# Patient Record
Sex: Male | Born: 1935 | Race: White | Hispanic: No | Marital: Married | Smoking: Former smoker
Health system: Southern US, Community
[De-identification: ages and names within clinical notes are randomized; demographics above are authoritative.]

## PROBLEM LIST (undated history)

## (undated) DIAGNOSIS — Z950 Presence of cardiac pacemaker: Secondary | ICD-10-CM

## (undated) DIAGNOSIS — I252 Old myocardial infarction: Secondary | ICD-10-CM

## (undated) DIAGNOSIS — M1990 Unspecified osteoarthritis, unspecified site: Secondary | ICD-10-CM

## (undated) DIAGNOSIS — E079 Disorder of thyroid, unspecified: Secondary | ICD-10-CM

## (undated) DIAGNOSIS — I2510 Atherosclerotic heart disease of native coronary artery without angina pectoris: Secondary | ICD-10-CM

## (undated) DIAGNOSIS — E785 Hyperlipidemia, unspecified: Secondary | ICD-10-CM

## (undated) DIAGNOSIS — I219 Acute myocardial infarction, unspecified: Secondary | ICD-10-CM

## (undated) DIAGNOSIS — N183 Chronic kidney disease, stage 3 unspecified: Secondary | ICD-10-CM

## (undated) DIAGNOSIS — K219 Gastro-esophageal reflux disease without esophagitis: Secondary | ICD-10-CM

## (undated) DIAGNOSIS — I10 Essential (primary) hypertension: Secondary | ICD-10-CM

## (undated) DIAGNOSIS — I4891 Unspecified atrial fibrillation: Secondary | ICD-10-CM

## (undated) DIAGNOSIS — N400 Enlarged prostate without lower urinary tract symptoms: Secondary | ICD-10-CM

## (undated) DIAGNOSIS — R269 Unspecified abnormalities of gait and mobility: Secondary | ICD-10-CM

## (undated) DIAGNOSIS — G4730 Sleep apnea, unspecified: Secondary | ICD-10-CM

## (undated) DIAGNOSIS — I499 Cardiac arrhythmia, unspecified: Secondary | ICD-10-CM

## (undated) DIAGNOSIS — I5022 Chronic systolic (congestive) heart failure: Secondary | ICD-10-CM

## (undated) DIAGNOSIS — E559 Vitamin D deficiency, unspecified: Secondary | ICD-10-CM

## (undated) DIAGNOSIS — T4145XA Adverse effect of unspecified anesthetic, initial encounter: Secondary | ICD-10-CM

## (undated) DIAGNOSIS — D649 Anemia, unspecified: Secondary | ICD-10-CM

## (undated) DIAGNOSIS — R0601 Orthopnea: Secondary | ICD-10-CM

## (undated) DIAGNOSIS — Z8619 Personal history of other infectious and parasitic diseases: Secondary | ICD-10-CM

## (undated) DIAGNOSIS — R609 Edema, unspecified: Secondary | ICD-10-CM

## (undated) DIAGNOSIS — T8859XA Other complications of anesthesia, initial encounter: Secondary | ICD-10-CM

## (undated) DIAGNOSIS — E039 Hypothyroidism, unspecified: Secondary | ICD-10-CM

## (undated) DIAGNOSIS — I209 Angina pectoris, unspecified: Secondary | ICD-10-CM

## (undated) HISTORY — DX: Personal history of other infectious and parasitic diseases: Z86.19

## (undated) HISTORY — PX: ATRIAL FIBRILLATION ABLATION: SHX5732

## (undated) HISTORY — PX: EXTERNAL FIXATION WRIST FRACTURE: SHX1553

## (undated) HISTORY — PX: CYST EXCISION: SHX5701

## (undated) HISTORY — DX: Vitamin D deficiency, unspecified: E55.9

## (undated) HISTORY — PX: CORONARY ANGIOPLASTY: SHX604

## (undated) HISTORY — DX: Old myocardial infarction: I25.2

## (undated) HISTORY — PX: LEG SURGERY: SHX1003

## (undated) HISTORY — PX: CARDIAC CATHETERIZATION: SHX172

## (undated) HISTORY — PX: ABLATION: SHX5711

## (undated) HISTORY — DX: Anemia, unspecified: D64.9

---

## 2000-07-26 HISTORY — PX: LASIK: SHX215

## 2002-11-06 LAB — HM COLONOSCOPY

## 2009-07-26 DIAGNOSIS — I252 Old myocardial infarction: Secondary | ICD-10-CM

## 2009-07-26 HISTORY — DX: Old myocardial infarction: I25.2

## 2009-10-21 ENCOUNTER — Inpatient Hospital Stay: Payer: Self-pay | Admitting: Cardiology

## 2009-11-27 HISTORY — PX: CORONARY ARTERY BYPASS GRAFT: SHX141

## 2010-01-27 ENCOUNTER — Encounter: Payer: Self-pay | Admitting: Cardiology

## 2010-02-23 ENCOUNTER — Encounter: Payer: Self-pay | Admitting: Cardiology

## 2010-03-26 ENCOUNTER — Encounter: Payer: Self-pay | Admitting: Cardiology

## 2012-01-24 DIAGNOSIS — I071 Rheumatic tricuspid insufficiency: Secondary | ICD-10-CM | POA: Insufficient documentation

## 2012-08-24 DIAGNOSIS — M25519 Pain in unspecified shoulder: Secondary | ICD-10-CM | POA: Insufficient documentation

## 2013-08-22 LAB — TSH: TSH: 4.84 u[IU]/mL (ref <=5.90)

## 2013-10-20 DIAGNOSIS — I10 Essential (primary) hypertension: Secondary | ICD-10-CM | POA: Insufficient documentation

## 2013-10-20 DIAGNOSIS — I2510 Atherosclerotic heart disease of native coronary artery without angina pectoris: Secondary | ICD-10-CM | POA: Insufficient documentation

## 2013-10-20 DIAGNOSIS — I25810 Atherosclerosis of coronary artery bypass graft(s) without angina pectoris: Secondary | ICD-10-CM | POA: Insufficient documentation

## 2014-02-04 ENCOUNTER — Ambulatory Visit: Payer: Self-pay | Admitting: Cardiology

## 2014-03-06 ENCOUNTER — Ambulatory Visit: Payer: Self-pay | Admitting: Cardiology

## 2014-08-26 LAB — LIPID PANEL
Cholesterol: 56 mg/dL (ref 0–200)
HDL: 24 mg/dL — AB (ref 35–70)
LDL Cholesterol: 19 mg/dL
Triglycerides: 65 mg/dL (ref 40–160)

## 2014-08-26 LAB — PSA
PSA: 0.5
PSA: 0.5

## 2014-08-26 LAB — BASIC METABOLIC PANEL
BUN: 19 mg/dL (ref 4–21)
CREATININE: 1.3 mg/dL (ref <=1.3)
Glucose: 103 mg/dL
Potassium: 4.9 mmol/L (ref 3.4–5.3)
Sodium: 137 mmol/L (ref 137–147)

## 2014-08-26 LAB — HEPATIC FUNCTION PANEL: ALT: 24 U/L (ref 10–40)

## 2014-08-26 LAB — HEMOGLOBIN A1C: HEMOGLOBIN A1C: 6.7 % — AB (ref 4.0–6.0)

## 2014-08-28 DIAGNOSIS — R195 Other fecal abnormalities: Secondary | ICD-10-CM | POA: Insufficient documentation

## 2014-08-28 LAB — FECAL OCCULT BLOOD, GUAIAC
FECAL OCCULT BLD: POSITIVE
Fecal Occult Blood: POSITIVE

## 2014-09-18 DIAGNOSIS — I255 Ischemic cardiomyopathy: Secondary | ICD-10-CM | POA: Insufficient documentation

## 2014-09-18 DIAGNOSIS — Q263 Partial anomalous pulmonary venous connection: Secondary | ICD-10-CM | POA: Insufficient documentation

## 2014-09-18 DIAGNOSIS — I519 Heart disease, unspecified: Secondary | ICD-10-CM | POA: Insufficient documentation

## 2014-09-18 DIAGNOSIS — I482 Chronic atrial fibrillation, unspecified: Secondary | ICD-10-CM | POA: Insufficient documentation

## 2014-09-18 DIAGNOSIS — I5022 Chronic systolic (congestive) heart failure: Secondary | ICD-10-CM | POA: Insufficient documentation

## 2014-10-26 DIAGNOSIS — E119 Type 2 diabetes mellitus without complications: Secondary | ICD-10-CM | POA: Insufficient documentation

## 2014-10-26 DIAGNOSIS — J069 Acute upper respiratory infection, unspecified: Secondary | ICD-10-CM | POA: Insufficient documentation

## 2014-10-26 DIAGNOSIS — E785 Hyperlipidemia, unspecified: Secondary | ICD-10-CM | POA: Insufficient documentation

## 2014-10-26 DIAGNOSIS — I219 Acute myocardial infarction, unspecified: Secondary | ICD-10-CM | POA: Insufficient documentation

## 2014-10-26 DIAGNOSIS — I10 Essential (primary) hypertension: Secondary | ICD-10-CM | POA: Insufficient documentation

## 2014-10-26 DIAGNOSIS — E039 Hypothyroidism, unspecified: Secondary | ICD-10-CM | POA: Insufficient documentation

## 2014-10-26 DIAGNOSIS — Z0000 Encounter for general adult medical examination without abnormal findings: Secondary | ICD-10-CM | POA: Insufficient documentation

## 2014-10-26 DIAGNOSIS — Z8619 Personal history of other infectious and parasitic diseases: Secondary | ICD-10-CM | POA: Insufficient documentation

## 2014-10-26 DIAGNOSIS — N400 Enlarged prostate without lower urinary tract symptoms: Secondary | ICD-10-CM | POA: Insufficient documentation

## 2014-10-26 DIAGNOSIS — Z125 Encounter for screening for malignant neoplasm of prostate: Secondary | ICD-10-CM | POA: Insufficient documentation

## 2014-10-26 DIAGNOSIS — N529 Male erectile dysfunction, unspecified: Secondary | ICD-10-CM | POA: Insufficient documentation

## 2014-10-26 DIAGNOSIS — C4491 Basal cell carcinoma of skin, unspecified: Secondary | ICD-10-CM | POA: Insufficient documentation

## 2014-10-26 DIAGNOSIS — Z79899 Other long term (current) drug therapy: Secondary | ICD-10-CM | POA: Insufficient documentation

## 2014-10-26 DIAGNOSIS — I4891 Unspecified atrial fibrillation: Secondary | ICD-10-CM | POA: Insufficient documentation

## 2014-11-11 ENCOUNTER — Other Ambulatory Visit: Payer: Self-pay | Admitting: Family Medicine

## 2014-11-11 ENCOUNTER — Encounter: Payer: Self-pay | Admitting: Family Medicine

## 2015-01-13 ENCOUNTER — Encounter
Admission: RE | Disposition: A | Discharge status: Home / Self Care | Payer: Self-pay | Source: Ambulatory Visit | Attending: Cardiology

## 2015-01-13 ENCOUNTER — Ambulatory Visit: Payer: Medicare Other | Admitting: Anesthesiology

## 2015-01-13 ENCOUNTER — Ambulatory Visit
Admission: RE | Admit: 2015-01-13 | Discharge: 2015-01-13 | Disposition: A | Discharge status: Home / Self Care | Payer: Medicare Other | Source: Ambulatory Visit | Attending: Cardiology | Admitting: Cardiology

## 2015-01-13 ENCOUNTER — Encounter: Payer: Self-pay | Admitting: *Deleted

## 2015-01-13 DIAGNOSIS — E119 Type 2 diabetes mellitus without complications: Secondary | ICD-10-CM | POA: Insufficient documentation

## 2015-01-13 DIAGNOSIS — I25810 Atherosclerosis of coronary artery bypass graft(s) without angina pectoris: Secondary | ICD-10-CM | POA: Insufficient documentation

## 2015-01-13 DIAGNOSIS — E782 Mixed hyperlipidemia: Secondary | ICD-10-CM | POA: Insufficient documentation

## 2015-01-13 DIAGNOSIS — Z8249 Family history of ischemic heart disease and other diseases of the circulatory system: Secondary | ICD-10-CM | POA: Insufficient documentation

## 2015-01-13 DIAGNOSIS — Z951 Presence of aortocoronary bypass graft: Secondary | ICD-10-CM | POA: Diagnosis not present

## 2015-01-13 DIAGNOSIS — Z720 Tobacco use: Secondary | ICD-10-CM | POA: Insufficient documentation

## 2015-01-13 DIAGNOSIS — I480 Paroxysmal atrial fibrillation: Secondary | ICD-10-CM | POA: Insufficient documentation

## 2015-01-13 DIAGNOSIS — R0602 Shortness of breath: Secondary | ICD-10-CM | POA: Insufficient documentation

## 2015-01-13 DIAGNOSIS — G4730 Sleep apnea, unspecified: Secondary | ICD-10-CM | POA: Diagnosis not present

## 2015-01-13 DIAGNOSIS — I272 Other secondary pulmonary hypertension: Secondary | ICD-10-CM | POA: Insufficient documentation

## 2015-01-13 DIAGNOSIS — I5022 Chronic systolic (congestive) heart failure: Secondary | ICD-10-CM | POA: Insufficient documentation

## 2015-01-13 DIAGNOSIS — Z79899 Other long term (current) drug therapy: Secondary | ICD-10-CM | POA: Insufficient documentation

## 2015-01-13 DIAGNOSIS — F419 Anxiety disorder, unspecified: Secondary | ICD-10-CM | POA: Insufficient documentation

## 2015-01-13 DIAGNOSIS — I252 Old myocardial infarction: Secondary | ICD-10-CM | POA: Diagnosis not present

## 2015-01-13 DIAGNOSIS — E079 Disorder of thyroid, unspecified: Secondary | ICD-10-CM | POA: Diagnosis not present

## 2015-01-13 DIAGNOSIS — Z87891 Personal history of nicotine dependence: Secondary | ICD-10-CM | POA: Diagnosis not present

## 2015-01-13 DIAGNOSIS — I482 Chronic atrial fibrillation: Secondary | ICD-10-CM | POA: Diagnosis present

## 2015-01-13 DIAGNOSIS — I4819 Other persistent atrial fibrillation: Secondary | ICD-10-CM

## 2015-01-13 HISTORY — DX: Acute myocardial infarction, unspecified: I21.9

## 2015-01-13 HISTORY — DX: Disorder of thyroid, unspecified: E07.9

## 2015-01-13 HISTORY — PX: ELECTROPHYSIOLOGIC STUDY: SHX172A

## 2015-01-13 HISTORY — DX: Essential (primary) hypertension: I10

## 2015-01-13 HISTORY — DX: Sleep apnea, unspecified: G47.30

## 2015-01-13 HISTORY — DX: Atherosclerotic heart disease of native coronary artery without angina pectoris: I25.10

## 2015-01-13 SURGERY — CARDIOVERSION (CATH LAB)
Anesthesia: General

## 2015-01-13 MED ORDER — EPHEDRINE SULFATE 50 MG/ML IJ SOLN
INTRAMUSCULAR | Status: DC | PRN
  Administered 2015-01-13: 10 mg via INTRAVENOUS
  Administered 2015-01-13: 5 mg via INTRAVENOUS

## 2015-01-13 MED ORDER — PROPOFOL 10 MG/ML IV BOLUS
INTRAVENOUS | Status: DC | PRN
  Administered 2015-01-13: 70 mg via INTRAVENOUS

## 2015-01-13 MED ORDER — LACTATED RINGERS IV SOLN
INTRAVENOUS | Status: DC | PRN
  Administered 2015-01-13: 07:00:00 via INTRAVENOUS

## 2015-01-13 MED ORDER — SODIUM CHLORIDE 0.9 % IV SOLN
INTRAVENOUS | Status: DC
  Administered 2015-01-13: 07:00:00 via INTRAVENOUS

## 2015-01-13 NOTE — Transfer of Care (Signed)
Immediate Anesthesia Transfer of Care Note  Patient: Jeffrey Gallegos  Procedure(s) Performed: Procedure(s): CARDIOVERSION (N/A)  Patient Location:   Anesthesia Type:General  Level of Consciousness: alert , oriented, patient cooperative and responds to stimulation  Airway & Oxygen Therapy: Patient Spontanous Breathing and Patient connected to face mask oxygen  Post-op Assessment: Report given to RN and Post -op Vital signs reviewed and stable  Post vital signs: Reviewed and stable  Last Vitals:  Filed Vitals:   01/13/15 0800  BP: 92/57  Pulse:   Temp:   Resp: 15    Complications: No apparent anesthesia complications

## 2015-01-13 NOTE — OR Nursing (Signed)
Transfer of care from anesthesia at [id number]. SEE anesthesia record for monitoring of pt during case

## 2015-01-13 NOTE — Anesthesia Preprocedure Evaluation (Signed)
Anesthesia Evaluation  Patient identified by MRN, date of birth, ID band Patient awake    Reviewed: Allergy & Precautions, NPO status , Patient's Chart, lab work & pertinent test results  History of Anesthesia Complications Negative for: history of anesthetic complications  Airway Mallampati: II       Dental  (+) Upper Dentures   Pulmonary sleep apnea , former smoker,    + decreased breath sounds      Cardiovascular hypertension, Pt. on medications + CAD and + Past MI + dysrhythmias Atrial Fibrillation Rhythm:Irregular     Neuro/Psych Anxiety negative neurological ROS     GI/Hepatic negative GI ROS, Neg liver ROS,   Endo/Other  diabetes, Oral Hypoglycemic Agents  Renal/GU negative Renal ROS  negative genitourinary   Musculoskeletal negative musculoskeletal ROS (+)   Abdominal Normal abdominal exam  (+)   Peds negative pediatric ROS (+)  Hematology negative hematology ROS (+)   Anesthesia Other Findings   Reproductive/Obstetrics negative OB ROS                             Anesthesia Physical Anesthesia Plan  ASA: III  Anesthesia Plan: General   Post-op Pain Management:    Induction: Intravenous  Airway Management Planned: Nasal Cannula  Additional Equipment:   Intra-op Plan:   Post-operative Plan:   Informed Consent: I have reviewed the patients History and Physical, chart, labs and discussed the procedure including the risks, benefits and alternatives for the proposed anesthesia with the patient or authorized representative who has indicated his/her understanding and acceptance.     Plan Discussed with: CRNA  Anesthesia Plan Comments:         Anesthesia Quick Evaluation

## 2015-01-13 NOTE — Discharge Instructions (Signed)
cardiovers

## 2015-01-13 NOTE — Procedures (Signed)
Electrical Cardioversion Procedure Note [facility] [id number] 1934/11/09  Procedure: Electrical Cardioversion Indications:  Atrial Fibrillation  Procedure Details Consent: Risks of procedure as well as the alternatives and risks of each were explained to the (patient/caregiver).  Consent for procedure obtained. Time Out: Verified patient identification, verified procedure, site/side was marked, verified correct patient position, special equipment/implants available, medications/allergies/relevent history reviewed, required imaging and test results available.  Performed  Patient placed on cardiac monitor, pulse oximetry, supplemental oxygen as necessary.  Sedation given: Propofol Pacer pads placed anterior and posterior chest.  Cardioverted 1 time(s).  Cardioverted at 120J.  Evaluation Findings: Post procedure EKG shows: NSR Complications: Pt was bradycardic and hypotensive post procedure which required ephedra 15 mg iv with resolution. Slow recovery from sedation. No evidence of neurologic complications post procedure Patient did tolerate procedure well.   Steffani Dionisio A. 01/13/2015, 8:17 AM

## 2015-01-13 NOTE — H&P (Signed)
Chief Complaint: Chief Complaint  Patient presents with  . Follow-up  per pt  . Atrial Fibrillation  had ablation on 12/13/2014 I think I went back into afib has an appt with Dr Marcello Moores next week  . Shortness of Breath  I do have some  . OTHER  I think Im getting to close to the edge -- heart attack  Date of Service: 01/10/2015 Date of Birth: [birth year] PCP: Birdie Sons, MD  History of Present Illness: Jeffrey Gallegos is a 80 y.o.male patient who returns for follow-up visit. Has a history of coronary artery disease status post coronary artery bypass grafting in 2011, history of recently diagnosed partially anomalous venous return. This has resulted in pulmonary hypertension. Had his clinical course decompensated with development of atrial fibrillation. He has had extensive workup at [facility] including ablation of his atrial fibrillation. He now returns complaining of increasing shortness of breath feeling like he is back in atrial fibrillation electrocardiogram today reveals atrial fibrillation at a rate of 86 with a right bundle-branch block left anterior fascicular block. His QRS duration 174 milliseconds QTC of 593 milliseconds. He is symptomatic from this complaining of shortness of breath. He desires consideration cardioversion. He has been anticoagulated with dabigatran continuously at 150 mg twice daily. Patient has an ejection fraction of 35% with an akinetic apex. He has a dilated right ventricle.  Past Medical and Surgical History  Past Medical History Past Medical History  Diagnosis Date  . Coronary artery disease  . Atrial fibrillation  . Hyperlipidemia  . Thyroid disease  . Hypertension  . Sleep apnea   Past Surgical History He has past surgical history that includes Cardiac catheterization; Coronary artery bypass graft (2011); Fracture surgery; and Cystectomy.   Medications and Allergies  Current Medications  Current Outpatient Prescriptions   Medication Sig Dispense Refill  . AMIOdarone (PACERONE) 200 MG tablet Take 1 tablet (200 mg total) by mouth every 12 (twelve) hours. 60 tablet 11  . atorvastatin (LIPITOR) 20 MG tablet Take 20 mg by mouth nightly.  . carvedilol (COREG) 3.125 MG tablet Take 3.125 mg by mouth 2 (two) times daily with meals.  . colchicine (COLCRYS) 0.6 mg tablet Take 1 tablet (0.6 mg total) by mouth once daily. Take 2 tablets (1.2mg ) by mouth at first sign of gout flare followed by 1 tablet (0.6mg ) after 1 hour. (Max 1.8mg  within 1 hour) 30 tablet 0  . dabigatran (PRADAXA) 150 mg capsule Take 1 capsule (150 mg total) by mouth 2 (two) times daily. 60 capsule 5  . docusate (COLACE) 100 MG capsule Take 100 mg by mouth nightly.  . levothyroxine (SYNTHROID, LEVOTHROID) 88 MCG tablet Take 100 mcg by mouth once daily. Take on an empty stomach with a glass of water at least 30-60 minutes before breakfast.  . naproxen sodium (ALEVE, ANAPROX) 220 MG tablet Take 220 mg by mouth as needed for Pain.  . niacin, inositol niacinate, 400 mg niacin (500 mg) Cap Take 3 capsules by mouth every morning.  . pantoprazole (PROTONIX) 20 MG DR tablet Take 20 mg by mouth every evening.  . potassium chloride 20 mEq TbER Take by mouth.  . ramipril (ALTACE) 2.5 MG capsule Take 1 capsule (2.5 mg total) by mouth once daily. 30 capsule 11  . saw palmetto 500 MG capsule Take 500 mg by mouth. Take 4 capsules po twice a day  . TORsemide (DEMADEX) 10 MG tablet Take 2 tablets (20 mg total) by mouth once daily. [place]  tablet 11  . nitroGLYcerin (NITROSTAT) 0.4 MG SL tablet Place 1 tablet (0.4 mg total) under the tongue every 5 (five) minutes as needed for Chest pain. May take up to 3 doses. 25 tablet 11  . tadalafil (CIALIS) 5 MG tablet Take 5 mg by mouth once daily as needed.   No current facility-administered medications for this visit.   Allergies: Review of patient's allergies indicates no known allergies.  Social and Family History  Social  History reports that he quit smoking about 48 years ago. His smoking use included Cigarettes. He has a 15 pack-year smoking history. His smokeless tobacco use includes Chew. He reports that he drinks about 0.6 oz of alcohol per week. He reports that he does not use illicit drugs.  Family History Family History  Problem Relation Age of Onset  . Sudden death Father  . Heart attack Father  . No Known Problems Mother  . No Known Problems Sister  . Anesthesia problems Neg Hx  . Malignant hyperthermia Neg Hx   Review of Systems  Review of Systems  Constitutional: Positive for malaise/fatigue. Negative for fever, chills, weight loss and diaphoresis.  HENT: Negative for congestion, ear discharge, hearing loss and tinnitus.  Eyes: Negative for blurred vision.  Respiratory: Positive for shortness of breath. Negative for cough, hemoptysis, sputum production and wheezing.  Cardiovascular: Negative for chest pain, palpitations, orthopnea, claudication, leg swelling and PND.  Gastrointestinal: Negative for heartburn, nausea, vomiting, abdominal pain, diarrhea, constipation, blood in stool and melena.  Genitourinary: Negative for dysuria, urgency, frequency and hematuria.  Musculoskeletal: Negative for myalgias, back pain, joint pain and falls.  Skin: Negative for itching and rash.  Neurological: Positive for weakness. Negative for dizziness, tingling, focal weakness, loss of consciousness and headaches.  Endo/Heme/Allergies: Negative for polydipsia. Does not bruise/bleed easily.  Psychiatric/Behavioral: Negative for depression, memory loss and substance abuse. The patient is not nervous/anxious.    Physical Examination   Vitals:BP 110/62 mmHg  Pulse 80  Resp 10  Ht 174 cm (5' 8.5")  Wt 66.225 kg (146 lb)  BMI 21.87 kg/m2 Ht:174 cm (5' 8.5") Wt:66.225 kg (146 lb) IHK:VQQV surface area is 1.79 meters squared. Body mass index is 21.87 kg/(m^2).  Wt Readings from Last 3 Encounters:  01/10/15  66.225 kg (146 lb)  12/13/14 61.5 kg (135 lb 9.3 oz)  12/12/14 63.3 kg (139 lb 8.8 oz)   BP Readings from Last 3 Encounters:  01/10/15 110/62  12/14/14 103/51  12/12/14 101/53   General appearance appears in no acute distress  Head Mouth and Eye exam Normocephalic, without obvious abnormality, atraumatic Dentition is good Eyes appear anicteric   Neck exam Thyroid: normal  Nodes: no obvious adenopathy  LUNGS Breath Sounds: Normal Percussion: Normal  CARDIOVASCULAR JVP CV wave: no HJR: no Elevation at 90 degrees: None Carotid Pulse: normal pulsation bilaterally Bruit: None Apex: apical impulse normal  Auscultation Rhythm: atrial fibrillation S1: normal S2: normal Clicks: no Rub: no Murmurs: no murmurs  Gallop: None ABDOMEN Liver enlargement: no Pulsatile aorta: no Ascites: no Bruits: no  EXTREMITIES Clubbing: no Edema: trace to 1+ bilateral pedal edema Pulses: peripheral pulses symmetrical Femoral Bruits: no Amputation: no SKIN Rash: no Cyanosis: no Embolic phemonenon: no Bruising: no NEURO Alert and Oriented to person, place and time: yes Non focal: yes  PSYCH: Pt appears to have normal affect  LABS REVIEWED Last 3 CBC results: Lab Results  Component Value Date  WBC 5.9 12/12/2014  WBC 5.9 09/18/2014  WBC 6.0 09/10/2014  Lab Results  Component Value Date  HGB 11.5* 12/12/2014  HGB 10.5* 09/18/2014  HGB 10.0* 09/10/2014   Lab Results  Component Value Date  HCT 36.3* 12/12/2014  HCT 33.1* 09/18/2014  HCT 33.8* 09/10/2014   Lab Results  Component Value Date  PLT 240 12/12/2014  PLT 199 09/18/2014  PLT 215 09/10/2014   Lab Results  Component Value Date  CREATININE 1.1 12/14/2014  BUN 14 12/14/2014  NA 136 12/14/2014  K 4.2 12/14/2014  CL 99 12/14/2014  CO2 29 12/14/2014   Lab Results  Component Value Date  HGBA1C 6.0 11/26/2009   Lab Results  Component Value Date  HDL 31 10/22/2009   Lab Results  Component  Value Date  LDLCALC 114 10/22/2009   Lab Results  Component Value Date  TRIG 88 10/22/2009   Lab Results  Component Value Date  ALT 56 11/29/2009  AST 75* 11/29/2009  ALKPHOS 41 11/29/2009   Lab Results  Component Value Date  TSH 10.32* 09/18/2014   Diagnostic Studies Reviewed:  EKG EKG demonstrated atrial fibrillation, rate 86, RBBB.  Assessment and Plan   79 y.o. male with  ICD-10-CM ICD-9-CM  1. Paroxysmal a-fib recurrent atrial fibrillation post ablation. Will proceed with attempt at cardioversion. The risks and benefits were explained to the patient he agrees to proceed. Will continue with dabigatran and he has been on this greater than 4 weeks prior to this attempt. Further recommendations after this attempt. This will be carried out early next week. I48.0 427.31 ECG 12-lead  2. Coronary atherosclerosis of autologous vein bypass graft without angina I25.810 414.02  3. Chronic systolic CHF (congestive heart failure), NYHA class 3 I50.22 428.22  428.0  4. Essential hypertension, benign I10 401.1  5. Atrial fibrillation, chronic I48.2 427.31  6. Partial anomalous pulmonary venous return Q26.3 747.42  7. Right ventricular enlargement I51.7 429.3  8. Severe tricuspid regurgitation I07.1 397.0  9. Hyperlipidemia, mixed E78.2 272.2   No Follow-up on file.  These notes generated with voice recognition software. I apologize for typographical errors.  Sydnee Levans, MD

## 2015-01-13 NOTE — Anesthesia Postprocedure Evaluation (Signed)
  Anesthesia Post-op Note  Patient: Jeffrey Gallegos  Procedure(s) Performed: Procedure(s): CARDIOVERSION (N/A)  Anesthesia type:General  Patient location: PACU  Post pain: Pain level controlled  Post assessment: Post-op Vital signs reviewed, Patient's Cardiovascular Status Stable, Respiratory Function Stable, Patent Airway and No signs of Nausea or vomiting  Post vital signs: Reviewed and stable  Last Vitals:  Filed Vitals:   01/13/15 0800  BP: 92/57  Pulse:   Temp:   Resp: 15    Level of consciousness: awake, alert  and patient cooperative  Complications: No apparent anesthesia complications

## 2015-01-13 NOTE — OR Nursing (Signed)
PT arrived via wheelchair for cardioversion. Doc. Atrial fib confirmed by EKG, last dose of pradexa 6-19. Anesthesia present for case and transfer of care to them began on completion of pre proc assessment.

## 2015-01-16 DIAGNOSIS — R001 Bradycardia, unspecified: Secondary | ICD-10-CM | POA: Insufficient documentation

## 2015-01-19 HISTORY — PX: INSERT / REPLACE / REMOVE PACEMAKER: SUR710

## 2015-02-17 ENCOUNTER — Other Ambulatory Visit: Payer: Self-pay | Admitting: Family Medicine

## 2015-02-21 DIAGNOSIS — N189 Chronic kidney disease, unspecified: Secondary | ICD-10-CM | POA: Insufficient documentation

## 2015-03-18 ENCOUNTER — Encounter: Payer: Medicare Other | Attending: Pediatric Cardiology | Admitting: *Deleted

## 2015-03-18 VITALS — Ht 68.5 in | Wt 142.5 lb

## 2015-03-18 DIAGNOSIS — I5022 Chronic systolic (congestive) heart failure: Secondary | ICD-10-CM | POA: Diagnosis present

## 2015-03-18 NOTE — Progress Notes (Signed)
Cardiac Individual Treatment Plan  Patient Details  Name: Jeffrey Gallegos MRN: [id number] Date of Birth: 1934-09-27 Referring Provider:  Newman Nickels*  Initial Encounter Date: Date: 03/18/15  Visit Diagnosis: Chronic systolic congestive heart failure  Patient's Home Medications on Admission:  Current outpatient prescriptions:  .  amiodarone (PACERONE) 200 MG tablet, Take 200 mg by mouth 2 (two) times daily., Disp: , Rfl:  .  atorvastatin (LIPITOR) 80 MG tablet, Take 20 mg by mouth every morning. Every evening, Disp: , Rfl:  .  carvedilol (COREG) 3.125 MG tablet, Take 1 tablet by mouth 2 (two) times daily., Disp: , Rfl:  .  docusate sodium (COLACE) 100 MG capsule, Take 100 mg by mouth daily as needed for mild constipation., Disp: , Rfl:  .  levothyroxine (SYNTHROID, LEVOTHROID) 100 MCG tablet, Take 1 tablet by mouth daily., Disp: , Rfl:  .  niacin 500 MG tablet, Take 3 tablets by mouth 1 day or 1 dose., Disp: , Rfl:  .  nitroGLYCERIN (NITROSTAT) 0.4 MG SL tablet, Place 0.4 mg under the tongue every 5 (five) minutes as needed for chest pain., Disp: , Rfl:  .  pantoprazole (PROTONIX) 40 MG tablet, TAKE ONE (1) TABLET BY MOUTH EVERY DAY, Disp: 30 tablet, Rfl: 12 .  Saw Palmetto, Serenoa repens, 450 MG CAPS, Take 3 tablets by mouth 2 (two) times daily., Disp: , Rfl:  .  torsemide (DEMADEX) 10 MG tablet, Take 20 mg by mouth daily., Disp: , Rfl:  .  DABIGATRAN ETEXILATE MESYLATE PO, Take by mouth., Disp: , Rfl:  .  digoxin (LANOXIN) 0.125 MG tablet, Take 1 tablet by mouth daily., Disp: , Rfl:  .  furosemide (LASIX) 20 MG tablet, Take 1 tablet by mouth 3 (three) times a week., Disp: , Rfl:  .  hydrocortisone (ANUSOL-HC) 2.5 % rectal cream, Place 1 application rectally. 2-3 times daily as needed, Disp: , Rfl:  .  naproxen sodium (ANAPROX) 220 MG tablet, Take 220 mg by mouth 2 (two) times daily with a meal. PRN for pain, Disp: , Rfl:  .  Potassium Chloride ER 20 MEQ TBCR, Take 2 tablets  by mouth daily., Disp: , Rfl:  .  ramipril (ALTACE) 2.5 MG capsule, Take 2.5 mg by mouth daily., Disp: , Rfl:  .  tadalafil (CIALIS) 5 MG tablet, Take 1 tablet by mouth daily., Disp: , Rfl:   Past Medical History: Past Medical History  Diagnosis Date  . History of MI (myocardial infarction) 2011  . History of mumps     Childhood  . Coronary artery disease   . Myocardial infarction   . Hypertension   . Sleep apnea   . Thyroid disease     synthroid    Tobacco Use: History  Smoking status  . Former Smoker  . Types: Cigarettes  . Quit date: 07/26/1968  Smokeless tobacco  . Current User  . Types: Chew    Comment: Quit smoking in the 1970s    Labs: Recent Review Scientist, physiological    Labs for ITP Cardiac and Pulmonary Rehab Latest Ref Rng 08/26/2014   Cholestrol 0 - 200 mg/dL 56   LDLCALC - 19   HDL 35 - 70 mg/dL 24(A)   Trlycerides 40 - 160 mg/dL 65   Hemoglobin A1c 4.0 - 6.0 % 6.7(A)       Exercise Target Goals: Date: 03/18/15  Exercise Program Goal: Individual exercise prescription set with THRR, safety & activity barriers. Participant demonstrates ability to understand and report RPE  using BORG scale, to self-measure pulse accurately, and to acknowledge the importance of the exercise prescription.  Exercise Prescription Goal: Starting with aerobic activity 30 plus minutes a day, 3 days per week for initial exercise prescription. Provide home exercise prescription and guidelines that participant acknowledges understanding prior to discharge.  Activity Barriers & Risk Stratification:     Activity Barriers & Risk Stratification - 03/18/15 0816    Activity Barriers & Risk Stratification   Activity Barriers None;Deconditioning   Risk Stratification High      6 Minute Walk:     6 Minute Walk      03/18/15 1024       6 Minute Walk   Phase Initial     Distance 800 feet     Walk Time 5.38 minutes     Resting HR 61 bpm     Resting BP 108/52 mmHg     Max Ex. HR  114 bpm     Max Ex. BP 100/50 mmHg     RPE 15     Symptoms No        Initial Exercise Prescription:     Initial Exercise Prescription - 03/18/15 1000    Date of Initial Exercise Prescription   Date 03/18/15   Treadmill   MPH 1.5   Grade 0   Minutes 10   Bike   Level 0.2   Watts 10   Minutes 10   Recumbant Bike   Level 2   RPM 40   Watts 15   Minutes 10   NuStep   Level 2   Watts 30   Minutes 15   Arm Ergometer   Level 1   Watts 8   Minutes 10   Arm/Foot Ergometer   Level 4   Watts 12   Minutes 10   Cybex   Level 1   RPM 50   Minutes 10   Recumbant Elliptical   Level 1   RPM 40   Watts 10   Minutes 10   Elliptical   Level 1   Speed 3   Minutes 1   REL-XR   Level 2   Watts 35   Minutes 15   Prescription Details   Frequency (times per week) 3   Duration Progress to 30 minutes of continuous aerobic without signs/symptoms of physical distress   Intensity   THRR REST +  30   Ratings of Perceived Exertion 11-15   Progression Continue progressive overload as per policy without signs/symptoms or physical distress.   Resistance Training   Training Prescription Yes   Weight 2   Reps 10-15      Exercise Prescription Changes:   Discharge Exercise Prescription (Final Exercise Prescription Changes):   Nutrition:  Target Goals: Understanding of nutrition guidelines, daily intake of sodium 1500mg , cholesterol 200mg , calories 30% from fat and 7% or less from saturated fats, daily to have 5 or more servings of fruits and vegetables.  Biometrics:     Pre Biometrics - 03/18/15 1021    Pre Biometrics   Height 5' 8.5" (1.74 m)   Weight 142 lb 8 oz (64.638 kg)   Waist Circumference 36.5 inches   Hip Circumference 36.25 inches   Waist to Hip Ratio 1.01 %   BMI (Calculated) 21.4       Nutrition Therapy Plan and Nutrition Goals:   Nutrition Discharge: Rate Your Plate Scores:   Nutrition Goals Re-Evaluation:   Psychosocial: Target Goals:  Acknowledge presence or absence of  depression, maximize coping skills, provide positive support system. Participant is able to verbalize types and ability to use techniques and skills needed for reducing stress and depression.  Initial Review & Psychosocial Screening:     Initial Psych Review & Screening - 03/18/15 0826    Family Dynamics   Good Support System? Yes   Barriers   Psychosocial barriers to participate in program There are no identifiable barriers or psychosocial needs.;The patient should benefit from training in stress management and relaxation.   Screening Interventions   Interventions Encouraged to exercise;Program counselor consult      Quality of Life Scores:     Quality of Life - 03/18/15 1628    Quality of Life Scores   Health/Function Pre 22.8 %   Socioeconomic Pre 30 %   Psych/Spiritual Pre 29.14 %   Family Pre 30 %   GLOBAL Pre 26.44 %      PHQ-9:     Recent Review Flowsheet Data    Depression screen [facility] 2/9 03/18/2015   Decreased Interest 0   Down, Depressed, Hopeless 0   PHQ - 2 Score 0   Altered sleeping 0   Tired, decreased energy 1   Change in appetite 0   Feeling bad or failure about yourself  0   Trouble concentrating 0   Moving slowly or fidgety/restless 0   Suicidal thoughts 0   PHQ-9 Score 1   Difficult doing work/chores Somewhat difficult      Psychosocial Evaluation and Intervention:   Psychosocial Re-Evaluation:   Vocational Rehabilitation: Provide vocational rehab assistance to qualifying candidates.   Vocational Rehab Evaluation & Intervention:     Vocational Rehab - 03/18/15 0817    Initial Vocational Rehab Evaluation & Intervention   Assessment shows need for Vocational Rehabilitation No      Education: Education Goals: Education classes will be provided on a weekly basis, covering required topics. Participant will state understanding/return demonstration of topics presented.  Learning Barriers/Preferences:      Learning Barriers/Preferences - 03/18/15 0817    Learning Barriers/Preferences   Learning Barriers None   Learning Preferences None      Education Topics: General Nutrition Guidelines/Fats and Fiber: -Group instruction provided by verbal, written material, models and posters to present the general guidelines for heart healthy nutrition. Gives an explanation and review of dietary fats and fiber.   Controlling Sodium/Reading Food Labels: -Group verbal and written material supporting the discussion of sodium use in heart healthy nutrition. Review and explanation with models, verbal and written materials for utilization of the food label.   Exercise Physiology & Risk Factors: - Group verbal and written instruction with models to review the exercise physiology of the cardiovascular system and associated critical values. Details cardiovascular disease risk factors and the goals associated with each risk factor.   Aerobic Exercise & Resistance Training: - Gives group verbal and written discussion on the health impact of inactivity. On the components of aerobic and resistive training programs and the benefits of this training and how to safely progress through these programs.   Flexibility, Balance, General Exercise Guidelines: - Provides group verbal and written instruction on the benefits of flexibility and balance training programs. Provides general exercise guidelines with specific guidelines to those with heart or lung disease. Demonstration and skill practice provided.   Stress Management: - Provides group verbal and written instruction about the health risks of elevated stress, cause of high stress, and healthy ways to reduce stress.   Depression: - Provides group  verbal and written instruction on the correlation between heart/lung disease and depressed mood, treatment options, and the stigmas associated with seeking treatment.   Anatomy & Physiology of the Heart: - Group  verbal and written instruction and models provide basic cardiac anatomy and physiology, with the coronary electrical and arterial systems. Review of: AMI, Angina, Valve disease, Heart Failure, Cardiac Arrhythmia, Pacemakers, and the ICD.   Cardiac Procedures: - Group verbal and written instruction and models to describe the testing methods done to diagnose heart disease. Reviews the outcomes of the test results. Describes the treatment choices: Medical Management, Angioplasty, or Coronary Bypass Surgery.   Cardiac Medications: - Group verbal and written instruction to review commonly prescribed medications for heart disease. Reviews the medication, class of the drug, and side effects. Includes the steps to properly store meds and maintain the prescription regimen.   Go Sex-Intimacy & Heart Disease, Get SMART - Goal Setting: - Group verbal and written instruction through game format to discuss heart disease and the return to sexual intimacy. Provides group verbal and written material to discuss and apply goal setting through the application of the S.M.A.R.T. Method.   Other Matters of the Heart: - Provides group verbal, written materials and models to describe Heart Failure, Angina, Valve Disease, and Diabetes in the realm of heart disease. Includes description of the disease process and treatment options available to the cardiac patient.   Exercise & Equipment Safety: - Individual verbal instruction and demonstration of equipment use and safety with use of the equipment.          Cardiac Rehab from 03/18/2015 in [facility]iac Re[facility]   Date  03/18/15   Educator  SB   Instruction Review Code  2- meets goals/outcomes      Infection Prevention: - Provides verbal and written material to individual with discussion of infection control including proper hand washing and proper equipment cleaning during exercise session.      Cardiac Rehab from 03/18/2015 in [facility] Cardiac Rehab   Date  03/18/15    Educator  SB   Instruction Review Code  2- meets goals/outcomes      Falls Prevention: - Provides verbal and written material to individual with discussion of falls prevention and safety.      Cardiac Rehab from 03/18/2015 in [facility] Cardiac Rehab   Date  03/18/15   Educator  Sb   Instruction Review Code  2- meets goals/outcomes      Diabetes: - Individual verbal and written instruction to review signs/symptoms of diabetes, desired ranges of glucose level fasting, after meals and with exercise. Advice that pre and post exercise glucose checks will be done for 3 sessions at entry of program.    Knowledge Questionnaire Score:     Knowledge Questionnaire Score - 03/18/15 1625    Knowledge Questionnaire Score   Pre Score 23/28      Personal Goals and Risk Factors at Admission:     Personal Goals and Risk Factors at Admission - 03/18/15 0824    Personal Goals and Risk Factors on Admission    Weight Management Yes   Intervention Learn and follow the exercise and diet guidelines while in the program. Utilize the nutrition and education classes to help gain knowledge of the diet and exercise expectations in the program   Increase Aerobic Exercise and Physical Activity Yes;Sedentary   Intervention While in program, learn and follow the exercise prescription taught. Start at a low level workload and increase workload after able to maintain  previous level for 30 minutes. Increase time before increasing intensity.   Intervention Provide exercise education and an individualized exercise prescription that will provide continued progressive overload as per policy without signs/symptoms of physical distress.   Diabetes No   Hypertension No   Lipids Yes   Goal Cholesterol controlled with medications as prescribed, with individualized exercise RX and with personalized nutrition plan. Value goals: LDL < 70mg , HDL > 40mg . Participant states understanding of desired cholesterol values and following  prescriptions.   Intervention Provide nutrition & aerobic exercise along with prescribed medications to achieve LDL 70mg , HDL >40mg .   Stress Yes   Goal To meet with psychosocial counselor for stress and relaxation information and guidance. To state understanding of performing relaxation techniques and or identifying personal stressors.   Intervention Provide education on types of stress, identifiying stressors, and ways to cope with stress. Provide demonstration and active practice of relaxation techniques.      Personal Goals and Risk Factors Review:    Personal Goals Discharge:     Comments:Initial Treatment Plan. Chevon has completed orientation and will start classes soon.

## 2015-03-18 NOTE — Patient Instructions (Signed)
Patient Instructions  Patient Details  Name: Jeffrey Gallegos MRN: [id number] Date of Birth: 12-20-1934 Referring Provider:  Newman Nickels*  Below are the personal goals you chose as well as exercise and nutrition goals. Our goal is to help you keep on track towards obtaining and maintaining your goals. We will be discussing your progress on these goals with you throughout the program.  Initial Exercise Prescription:     Initial Exercise Prescription - 03/18/15 1000    Date of Initial Exercise Prescription   Date 03/18/15   Treadmill   MPH 1.5   Grade 0   Minutes 10   Bike   Level 0.2   Watts 10   Minutes 10   Recumbant Bike   Level 2   RPM 40   Watts 15   Minutes 10   NuStep   Level 2   Watts 30   Minutes 15   Arm Ergometer   Level 1   Watts 8   Minutes 10   Arm/Foot Ergometer   Level 4   Watts 12   Minutes 10   Cybex   Level 1   RPM 50   Minutes 10   Recumbant Elliptical   Level 1   RPM 40   Watts 10   Minutes 10   Elliptical   Level 1   Speed 3   Minutes 1   REL-XR   Level 2   Watts 35   Minutes 15   Prescription Details   Frequency (times per week) 3   Duration Progress to 30 minutes of continuous aerobic without signs/symptoms of physical distress   Intensity   THRR REST +  30   Ratings of Perceived Exertion 11-15   Progression Continue progressive overload as per policy without signs/symptoms or physical distress.   Resistance Training   Training Prescription Yes   Weight 2   Reps 10-15      Exercise Goals: Frequency: Be able to perform aerobic exercise three times per week working toward 3-5 days per week.  Intensity: Work with a perceived exertion of 11 (fairly light) - 15 (hard) as tolerated. Follow your new exercise prescription and watch for changes in prescription as you progress with the program. Changes will be reviewed with you when they are made.  Duration: You should be able to do 30 minutes of continuous aerobic  exercise in addition to a 5 minute warm-up and a 5 minute cool-down routine.  Nutrition Goals: Your personal nutrition goals will be established when you do your nutrition analysis with the dietician.  The following are nutrition guidelines to follow: Cholesterol < 200mg /day Sodium < 1500mg /day Fiber: Men over 50 yrs - 30 grams per day    Personal Goals:     Personal Goals and Risk Factors at Admission - 03/18/15 0824    Personal Goals and Risk Factors on Admission    Weight Management Yes   Intervention Learn and follow the exercise and diet guidelines while in the program. Utilize the nutrition and education classes to help gain knowledge of the diet and exercise expectations in the program   Increase Aerobic Exercise and Physical Activity Yes;Sedentary   Intervention While in program, learn and follow the exercise prescription taught. Start at a low level workload and increase workload after able to maintain previous level for 30 minutes. Increase time before increasing intensity.   Intervention Provide exercise education and an individualized exercise prescription that will provide continued progressive overload as per policy  without signs/symptoms of physical distress.   Diabetes No   Hypertension No   Lipids Yes   Goal Cholesterol controlled with medications as prescribed, with individualized exercise RX and with personalized nutrition plan. Value goals: LDL < 70mg , HDL > 40mg . Participant states understanding of desired cholesterol values and following prescriptions.   Intervention Provide nutrition & aerobic exercise along with prescribed medications to achieve LDL 70mg , HDL >40mg .   Stress Yes   Goal To meet with psychosocial counselor for stress and relaxation information and guidance. To state understanding of performing relaxation techniques and or identifying personal stressors.   Intervention Provide education on types of stress, identifiying stressors, and ways to cope  with stress. Provide demonstration and active practice of relaxation techniques.      Tobacco Use Initial Evaluation: History  Smoking status  . Former Smoker  . Types: Cigarettes  . Quit date: 07/26/1968  Smokeless tobacco  . Current User  . Types: Chew    Comment: Quit smoking in the 1970s    Copy of goals given to participant.

## 2015-03-25 ENCOUNTER — Encounter: Payer: Medicare Other | Admitting: *Deleted

## 2015-03-25 DIAGNOSIS — I5022 Chronic systolic (congestive) heart failure: Secondary | ICD-10-CM | POA: Diagnosis not present

## 2015-03-25 NOTE — Progress Notes (Signed)
Daily Session Note  Patient Details  Name: Jeffrey Gallegos MRN: [id number] Date of Birth: 16-Feb-1935 Referring Provider:  Newman Nickels*  Encounter Date: 03/25/2015  Check In:     Session Check In - 03/25/15 1043    Check-In   Staff Present Candiss Norse MS, ACSM CEP Exercise Physiologist;Diane Joya Gaskins RN, BSN;Other   ER physicians immediately available to respond to emergencies See telemetry face sheet for immediately available ER MD   Medication changes reported     No   Fall or balance concerns reported    No   Warm-up and Cool-down Performed on first and last piece of equipment   Pain Assessment   Currently in Pain? No/denies   Multiple Pain Sites No         Goals Met:  Proper associated with RPD/PD & O2 Sat Exercise tolerated well Personal goals reviewed No report of cardiac concerns or symptoms Strength training completed today  Goals Unmet:  Not Applicable  Goals Comments: First day of exercise. Reviewed exercise goals and oriented to equipment.    Dr. Emily Filbert is Medical Director for [facility].

## 2015-03-27 ENCOUNTER — Encounter: Payer: Self-pay | Admitting: *Deleted

## 2015-03-27 ENCOUNTER — Encounter: Payer: Medicare Other | Attending: Pediatric Cardiology

## 2015-03-27 ENCOUNTER — Ambulatory Visit: Payer: Self-pay | Admitting: Family Medicine

## 2015-03-27 DIAGNOSIS — I5022 Chronic systolic (congestive) heart failure: Secondary | ICD-10-CM | POA: Insufficient documentation

## 2015-03-27 NOTE — Progress Notes (Signed)
Daily Session Note  Patient Details  Name: Jeffrey Gallegos MRN: [id number] Date of Birth: August 04, 1934 Referring Provider:  Newman Nickels*  Encounter Date: 03/27/2015  Check In:     Session Check In - 03/27/15 0929    Check-In   Staff Present Lestine Box BS, ACSM EP-C, Exercise Physiologist;Carroll Enterkin RN, BSN;Other   ER physicians immediately available to respond to emergencies See telemetry face sheet for immediately available ER MD   Medication changes reported     No   Fall or balance concerns reported    No   Warm-up and Cool-down Performed on first and last piece of equipment   VAD Patient? No   Pain Assessment   Currently in Pain? No/denies         Goals Met:  Proper associated with RPD/PD & O2 Sat Exercise tolerated well No report of cardiac concerns or symptoms Strength training completed today  Goals Unmet:  Not Applicable  Goals Comments:    Dr. Emily Filbert is Medical Director for [place] [facility].

## 2015-03-28 ENCOUNTER — Ambulatory Visit (INDEPENDENT_AMBULATORY_CARE_PROVIDER_SITE_OTHER): Payer: Medicare Other | Admitting: Family Medicine

## 2015-03-28 ENCOUNTER — Encounter: Payer: Self-pay | Admitting: Family Medicine

## 2015-03-28 VITALS — BP 98/60 | HR 60 | Temp 98.0°F | Resp 16 | Wt 144.0 lb

## 2015-03-28 DIAGNOSIS — Z23 Encounter for immunization: Secondary | ICD-10-CM | POA: Diagnosis not present

## 2015-03-28 DIAGNOSIS — R195 Other fecal abnormalities: Secondary | ICD-10-CM | POA: Diagnosis not present

## 2015-03-28 DIAGNOSIS — E119 Type 2 diabetes mellitus without complications: Secondary | ICD-10-CM

## 2015-03-28 DIAGNOSIS — K5900 Constipation, unspecified: Secondary | ICD-10-CM

## 2015-03-28 DIAGNOSIS — E039 Hypothyroidism, unspecified: Secondary | ICD-10-CM

## 2015-03-28 LAB — POCT GLYCOSYLATED HEMOGLOBIN (HGB A1C)
ESTIMATED AVERAGE GLUCOSE: 148
Hemoglobin A1C: 6.8

## 2015-03-28 NOTE — Patient Instructions (Signed)
Start taking one heaping tablespoon of Metamucil every evening with a full glass of water.

## 2015-03-28 NOTE — Progress Notes (Signed)
Patient: Jeffrey Gallegos Male    DOB: 15-Jun-1935   80 y.o.   MRN: [id number] Visit Date: 03/28/2015  Today's Provider: [name], MD   Chief Complaint  Patient presents with  . Follow-up  . Hypertension  . Hypothyroidism  . Diabetes   Subjective:    HPI    Constipation He reports increasing difficulty have to strain with BM over the last year. He takes 4 docusate along with an herbal remedy every day and state that if he forces himself to go daily he can manage, but if he misses one day he is unable to pass stool and has to use enema. He states width of stool is about that of a pencil.   He was founded to have heme positive stool at his yearly check up in February, but he refused GI referral at the time due to ongoing cardiac workup. He did have normal colonoscopy by Dr. Nicolasa Ducking in 2004 at the age of 32.    Hypothyroid  Levothyroxine was increased from 39mcg to 139mcg daily in [place] when TSH was found to be 10.32 at labs ordered by his cardiologist at [facility].   Diabetes Mellitus Type II, Follow-up:   Lab Results  Component Value Date   HGBA1C 6.7* 08/26/2014   Last seen for diabetes 4 months ago.  Management changes included none. He reports good compliance with treatment.  Episodes of hypoglycemia? no   ------------------------------------------------------------------------   Hypertension, follow-up:  BP Readings from Last 3 Encounters:  03/28/15 98/60  01/13/15 92/57  08/28/14 132/72    He was last seen for hypertension 4 months ago.  BP at that visit was 132/72. Management changes since that visit include none .He reports good compliance with treatment. He is not having side effects.  He is exercising. He is adherent to low salt diet.     Patient denies chest pain, dyspnea, exertional chest pressure/discomfort, irregular heart beat, lower extremity edema, orthopnea, palpitations, paroxysmal nocturnal dyspnea and tachypnea.      ------------------------------------------------------------------------     No Known Allergies Previous Medications   ACETAMINOPHEN (TYLENOL PO)    Take by mouth as needed.   AMIODARONE (PACERONE) 200 MG TABLET    Take 200 mg by mouth 2 (two) times daily.   ATORVASTATIN (LIPITOR) 80 MG TABLET    Take 20 mg by mouth every morning. Every evening   CARVEDILOL (COREG) 3.125 MG TABLET    Take 1 tablet by mouth 2 (two) times daily.   DABIGATRAN ETEXILATE MESYLATE PO    Take by mouth.   DIGOXIN (LANOXIN) 0.125 MG TABLET    Take 1 tablet by mouth daily.   DOCUSATE SODIUM (COLACE) 100 MG CAPSULE    Take 100 mg by mouth daily as needed for mild constipation.   FUROSEMIDE (LASIX) 20 MG TABLET    Take 1 tablet by mouth 3 (three) times a week.   HYDROCORTISONE (ANUSOL-HC) 2.5 % RECTAL CREAM    Place 1 application rectally. 2-3 times daily as needed   LEVOTHYROXINE (SYNTHROID, LEVOTHROID) 100 MCG TABLET    Take 1 tablet by mouth daily.   NAPROXEN SODIUM (ANAPROX) 220 MG TABLET    Take 220 mg by mouth 2 (two) times daily with a meal. PRN for pain   NIACIN 500 MG TABLET    Take 3 tablets by mouth 1 day or 1 dose.   NITROGLYCERIN (NITROSTAT) 0.4 MG SL TABLET    Place 0.4 mg under the tongue every  5 (five) minutes as needed for chest pain.   PANTOPRAZOLE (PROTONIX) 40 MG TABLET    TAKE ONE (1) TABLET BY MOUTH EVERY DAY   POTASSIUM CHLORIDE ER 20 MEQ TBCR    Take 2 tablets by mouth daily.   RAMIPRIL (ALTACE) 2.5 MG CAPSULE    Take 2.5 mg by mouth daily.   SAW PALMETTO, SERENOA REPENS, 450 MG CAPS    Take 3 tablets by mouth 2 (two) times daily.   TADALAFIL (CIALIS) 5 MG TABLET    Take 1 tablet by mouth daily.   TORSEMIDE (DEMADEX) 10 MG TABLET    Take by mouth. Takes 20 in the morning and 10 in the evening    Review of Systems  Constitutional: Positive for fatigue. Negative for chills and diaphoresis.  HENT: Negative for congestion and sinus pressure.   Respiratory: Negative for apnea and chest  tightness.   Cardiovascular: Negative for chest pain, palpitations and leg swelling.  Gastrointestinal: Negative for abdominal pain and abdominal distention.  Neurological: Positive for weakness.   Patient Active Problem List   Diagnosis Date Noted  . Chronic kidney disease 02/21/2015  . Bradycardia 01/16/2015  . A-fib 10/26/2014  . Basal cell carcinoma of skin 10/26/2014  . BPH (benign prostatic hyperplasia) 10/26/2014  . Arteriosclerosis of coronary artery 10/26/2014  . ED (erectile dysfunction) of organic origin 10/26/2014  . Hyperlipidemia 10/26/2014  . BP (high blood pressure) 10/26/2014  . Adult hypothyroidism 10/26/2014  . Diabetes 10/26/2014  . Polypharmacy 10/26/2014  . Chronic systolic heart failure 76/54/6503  . Cardiomyopathy, ischemic 09/18/2014  . Partial anomalous pulmonary venous connection 09/18/2014  . Disorder of right ventricle of heart 09/18/2014  . Fecal occult blood test positive 08/28/2014  . 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 Benign essential HTN 10/20/2013  . Severe tricuspid regurgitation 01/24/2012    Social History  Substance Use Topics  . Smoking status: Former Smoker    Types: Cigarettes    Quit date: 07/26/1968  . Smokeless tobacco: Current User    Types: Chew     Comment: Quit smoking in the 1970s  . Alcohol Use: 0.0 oz/week    0 Standard drinks or equivalent per week     Comment: Drinks Scotch   Objective:   BP 98/60 mmHg  Pulse 60  Temp(Src) 98 F (36.7 C) (Oral)  Resp 16  Wt 144 lb (65.318 kg)  SpO2 99%  Physical Exam   General Appearance:    Alert, cooperative, no distress, thin  Eyes:    PERRL, conjunctiva/corneas clear, EOM's intact       Lungs:     Clear to auscultation bilaterally, respirations unlabored  Heart:    Regular rate and rhythm  Neurologic:   Awake, alert, oriented x 3. No apparent focal neurological           defect.           Assessment & Plan:     1.  Constipation, unspecified constipation type Add daily fiber supplement to his current regiment, preferably metamucil.  -- Ambulatory referral to Gastroenterology  2. Hypothyroidism, unspecified hypothyroidism type  - T4 AND TSH  3. Fecal occult blood test positive Has delayed GI evaluation up to now due to ongoing cardiac workup, but feels he is now ready to have this evaluated.  - Ambulatory referral to Gastroenterology  4. Type 2 diabetes mellitus without complication Diet controlled.  - POCT glycosylated hemoglobin (Hb A1C) - Renal function panel  5. Need for influenza vaccination  - Flu vaccine HIGH DOSE PF       [name], MD  [facility]

## 2015-04-01 DIAGNOSIS — I5022 Chronic systolic (congestive) heart failure: Secondary | ICD-10-CM | POA: Diagnosis not present

## 2015-04-01 NOTE — Progress Notes (Signed)
Daily Session Note  Patient Details  Name: Jeffrey Gallegos MRN: [id number] Date of Birth: [birth year] Referring Provider:  Newman Nickels*  Encounter Date: 04/01/2015  Check In:     Session Check In - 04/01/15 0931    Check-In   Staff Present Other;Renee Dillard Essex MS, ACSM CEP Exercise Physiologist;Diane Mariana Arn, BSN   ER physicians immediately available to respond to emergencies See telemetry face sheet for immediately available ER MD   Medication changes reported     No   Fall or balance concerns reported    No   Warm-up and Cool-down Performed on first and last piece of equipment   VAD Patient? No   Pain Assessment   Currently in Pain? No/denies         Goals Met:  Independence with exercise equipment Exercise tolerated well No report of cardiac concerns or symptoms Strength training completed today  Goals Unmet:  Not Applicable  Goals Comments: Progressing well with his exercise prescription.    Dr. Emily Filbert is Medical Director for [facility] and [facility].

## 2015-04-04 ENCOUNTER — Other Ambulatory Visit: Payer: Self-pay | Admitting: Family Medicine

## 2015-04-04 ENCOUNTER — Telehealth: Payer: Self-pay | Admitting: *Deleted

## 2015-04-04 ENCOUNTER — Encounter: Payer: Self-pay | Admitting: Cardiology

## 2015-04-04 DIAGNOSIS — N183 Chronic kidney disease, stage 3 (moderate): Secondary | ICD-10-CM

## 2015-04-04 DIAGNOSIS — E039 Hypothyroidism, unspecified: Secondary | ICD-10-CM

## 2015-04-04 LAB — RENAL FUNCTION PANEL
ALBUMIN: 3.6 g/dL (ref 3.5–4.8)
BUN/Creatinine Ratio: 15 (ref 10–22)
BUN: 30 mg/dL — ABNORMAL HIGH (ref 8–27)
CALCIUM: 8.8 mg/dL (ref 8.6–10.2)
CHLORIDE: 88 mmol/L — AB (ref 97–108)
CO2: 24 mmol/L (ref 18–29)
Creatinine, Ser: 1.97 mg/dL — ABNORMAL HIGH (ref 0.76–1.27)
GFR calc Af Amer: 37 mL/min/{1.73_m2} — ABNORMAL LOW (ref >59)
GFR calc non Af Amer: 32 mL/min/{1.73_m2} — ABNORMAL LOW (ref >59)
GLUCOSE: 103 mg/dL — AB (ref 65–99)
PHOSPHORUS: 3.8 mg/dL (ref 2.5–4.5)
POTASSIUM: 4.4 mmol/L (ref 3.5–5.2)
SODIUM: 130 mmol/L — AB (ref 134–144)

## 2015-04-04 LAB — T4 AND TSH
T4 TOTAL: 7.1 ug/dL (ref 4.5–12.0)
TSH: 13.96 u[IU]/mL — ABNORMAL HIGH (ref 0.450–4.500)

## 2015-04-04 MED ORDER — LEVOTHYROXINE SODIUM 112 MCG PO TABS: 112.0000 ug | ORAL_TABLET | Freq: Every day | ORAL | Status: DC

## 2015-04-04 NOTE — Telephone Encounter (Signed)
He should follow Dr. Bethanne Ginger recommendations. We will send copy of labs to Dr. Bethanne Ginger office for him to review. (please fax to his office)  Recommend he be referred to nephrology due to declining kidney functions.

## 2015-04-04 NOTE — Telephone Encounter (Signed)
Patient stated that he saw Dr, Ubaldo Glassing yesterday and he increased Jeffrey Gallegos torsemide to 60 mg in the am and 40 in the pm. Patient wanted to know what he should do? Faxed copy of pt's results to Dr. Ubaldo Glassing, per pt request.

## 2015-04-04 NOTE — Telephone Encounter (Signed)
Patient notified

## 2015-04-04 NOTE — Telephone Encounter (Signed)
Patient notified of results. Rx sent to pharmacy. 

## 2015-04-04 NOTE — Telephone Encounter (Signed)
-----   Message from Birdie Sons, MD sent at 04/04/2015 10:33 AM EDT ----- Is hypothyroid. Need to increase levothyroxine to 112 mcg daily, #30, rf x 3. Need to recheck TSH in one month.  Kidney functions have declined and sodium levels are low. This is side effects of diuretics. Try to reduce torsemide to one a day. Be sure to keep legs elevated to minimize swelling.

## 2015-04-08 ENCOUNTER — Encounter: Payer: Self-pay | Admitting: *Deleted

## 2015-04-08 DIAGNOSIS — I5022 Chronic systolic (congestive) heart failure: Secondary | ICD-10-CM

## 2015-04-08 NOTE — Progress Notes (Signed)
Cardiac Individual Treatment Plan  Patient Details  Name: Jeffrey Gallegos MRN: [id number] Date of Birth: 1935-04-01 Referring Provider:  Newman Nickels*  Initial Encounter Date:    Visit Diagnosis: Chronic systolic congestive heart failure  Patient's Home Medications on Admission:  Current outpatient prescriptions:  .  Acetaminophen (TYLENOL PO), Take by mouth as needed., Disp: , Rfl:  .  amiodarone (PACERONE) 200 MG tablet, Take 200 mg by mouth daily. , Disp: , Rfl:  .  atorvastatin (LIPITOR) 80 MG tablet, Take 20 mg by mouth every morning. Every evening, Disp: , Rfl:  .  carvedilol (COREG) 3.125 MG tablet, Take 1 tablet by mouth 2 (two) times daily., Disp: , Rfl:  .  DABIGATRAN ETEXILATE MESYLATE PO, Take by mouth., Disp: , Rfl:  .  digoxin (LANOXIN) 0.125 MG tablet, Take 1 tablet by mouth daily., Disp: , Rfl:  .  docusate sodium (COLACE) 100 MG capsule, Take 100 mg by mouth daily as needed for mild constipation., Disp: , Rfl:  .  furosemide (LASIX) 20 MG tablet, Take 1 tablet by mouth 3 (three) times a week., Disp: , Rfl:  .  hydrocortisone (ANUSOL-HC) 2.5 % rectal cream, Place 1 application rectally. 2-3 times daily as needed, Disp: , Rfl:  .  levothyroxine (SYNTHROID, LEVOTHROID) 112 MCG tablet, Take 1 tablet (112 mcg total) by mouth daily before breakfast., Disp: 30 tablet, Rfl: 3 .  naproxen sodium (ANAPROX) 220 MG tablet, Take 220 mg by mouth 2 (two) times daily with a meal. PRN for pain, Disp: , Rfl:  .  niacin 500 MG tablet, Take 3 tablets by mouth 1 day or 1 dose., Disp: , Rfl:  .  nitroGLYCERIN (NITROSTAT) 0.4 MG SL tablet, Place 0.4 mg under the tongue every 5 (five) minutes as needed for chest pain., Disp: , Rfl:  .  pantoprazole (PROTONIX) 40 MG tablet, TAKE ONE (1) TABLET BY MOUTH EVERY DAY, Disp: 30 tablet, Rfl: 12 .  Potassium Chloride ER 20 MEQ TBCR, Take 2 tablets by mouth daily., Disp: , Rfl:  .  ramipril (ALTACE) 2.5 MG capsule, Take 2.5 mg by mouth daily.,  Disp: , Rfl:  .  Saw Palmetto, Serenoa repens, 450 MG CAPS, Take 3 tablets by mouth 2 (two) times daily., Disp: , Rfl:  .  tadalafil (CIALIS) 5 MG tablet, Take 1 tablet by mouth daily., Disp: , Rfl:  .  torsemide (DEMADEX) 10 MG tablet, Take by mouth. Takes 20 in the morning and 10 in the evening, Disp: , Rfl:   Past Medical History: Past Medical History  Diagnosis Date  . History of MI (myocardial infarction) 2011  . History of mumps     Childhood  . Coronary artery disease   . Myocardial infarction   . Hypertension   . Sleep apnea   . Thyroid disease     synthroid    Tobacco Use: History  Smoking status  . Former Smoker  . Types: Cigarettes  . Quit date: 07/26/1968  Smokeless tobacco  . Current User  . Types: Chew    Comment: Quit smoking in the 1970s    Labs: Recent Review Scientist, physiological    Labs for ITP Cardiac and Pulmonary Rehab Latest Ref Rng 08/26/2014   Cholestrol 0 - 200 mg/dL 56   LDLCALC - 19   HDL 35 - 70 mg/dL 24(A)   Trlycerides 40 - 160 mg/dL 65   Hemoglobin A1c 4.0 - 6.0 % 6.7(A)       Exercise Target  Goals:    Exercise Program Goal: Individual exercise prescription set with THRR, safety & activity barriers. Participant demonstrates ability to understand and report RPE using BORG scale, to self-measure pulse accurately, and to acknowledge the importance of the exercise prescription.  Exercise Prescription Goal: Starting with aerobic activity 30 plus minutes a day, 3 days per week for initial exercise prescription. Provide home exercise prescription and guidelines that participant acknowledges understanding prior to discharge.  Activity Barriers & Risk Stratification:     Activity Barriers & Risk Stratification - 03/18/15 0816    Activity Barriers & Risk Stratification   Activity Barriers None;Deconditioning   Risk Stratification High      6 Minute Walk:     6 Minute Walk      03/18/15 1024       6 Minute Walk   Phase Initial      Distance 800 feet     Walk Time 5.38 minutes     Resting HR 61 bpm     Resting BP 108/52 mmHg     Max Ex. HR 114 bpm     Max Ex. BP 100/50 mmHg     RPE 15     Symptoms No        Initial Exercise Prescription:     Initial Exercise Prescription - 03/18/15 1000    Date of Initial Exercise Prescription   Date 03/18/15   Treadmill   MPH 1.5   Grade 0   Minutes 10   Bike   Level 0.2   Watts 10   Minutes 10   Recumbant Bike   Level 2   RPM 40   Watts 15   Minutes 10   NuStep   Level 2   Watts 30   Minutes 15   Arm Ergometer   Level 1   Watts 8   Minutes 10   Arm/Foot Ergometer   Level 4   Watts 12   Minutes 10   Cybex   Level 1   RPM 50   Minutes 10   Recumbant Elliptical   Level 1   RPM 40   Watts 10   Minutes 10   Elliptical   Level 1   Speed 3   Minutes 1   REL-XR   Level 2   Watts 35   Minutes 15   Prescription Details   Frequency (times per week) 3   Duration Progress to 30 minutes of continuous aerobic without signs/symptoms of physical distress   Intensity   THRR REST +  30   Ratings of Perceived Exertion 11-15   Progression Continue progressive overload as per policy without signs/symptoms or physical distress.   Resistance Training   Training Prescription Yes   Weight 2   Reps 10-15      Exercise Prescription Changes:     Exercise Prescription Changes      04/03/15 1400           Exercise Review   Progression Yes       Response to Exercise   Blood Pressure (Admit) 110/60 mmHg       Blood Pressure (Exercise) 126/56 mmHg       Blood Pressure (Exit) 108/60 mmHg       Heart Rate (Admit) 62 bpm       Heart Rate (Exercise) 76 bpm       Heart Rate (Exit) 56 bpm       Rating of Perceived Exertion (Exercise) 13  Symptoms No       Duration Progress to 30 minutes of continuous aerobic without signs/symptoms of physical distress       Intensity Rest + 30       Progression Continue progressive overload as per policy without  signs/symptoms or physical distress.       Resistance Training   Training Prescription Yes       Weight 3       Reps 10-15       Interval Training   Interval Training No       Treadmill   MPH 1.5       Grade 0       Minutes 15       NuStep   Level 2       Watts 25       Minutes 15          Discharge Exercise Prescription (Final Exercise Prescription Changes):     Exercise Prescription Changes - 04/03/15 1400    Exercise Review   Progression Yes   Response to Exercise   Blood Pressure (Admit) 110/60 mmHg   Blood Pressure (Exercise) 126/56 mmHg   Blood Pressure (Exit) 108/60 mmHg   Heart Rate (Admit) 62 bpm   Heart Rate (Exercise) 76 bpm   Heart Rate (Exit) 56 bpm   Rating of Perceived Exertion (Exercise) 13   Symptoms No   Duration Progress to 30 minutes of continuous aerobic without signs/symptoms of physical distress   Intensity Rest + 30   Progression Continue progressive overload as per policy without signs/symptoms or physical distress.   Resistance Training   Training Prescription Yes   Weight 3   Reps 10-15   Interval Training   Interval Training No   Treadmill   MPH 1.5   Grade 0   Minutes 15   NuStep   Level 2   Watts 25   Minutes 15      Nutrition:  Target Goals: Understanding of nutrition guidelines, daily intake of sodium <1580m, cholesterol <2070m calories 30% from fat and 7% or less from saturated fats, daily to have 5 or more servings of fruits and vegetables.  Biometrics:     Pre Biometrics - 03/18/15 1021    Pre Biometrics   Height 5' 8.5" (1.74 m)   Weight 142 lb 8 oz (64.638 kg)   Waist Circumference 36.5 inches   Hip Circumference 36.25 inches   Waist to Hip Ratio 1.01 %   BMI (Calculated) 21.4       Nutrition Therapy Plan and Nutrition Goals:     Nutrition Therapy & Goals - 04/08/15 0729    Intervention Plan   Intervention Using nutrition plan and personal goals to gain a healthy nutrition lifestyle. Add exercise as  prescribed.      Nutrition Discharge: Rate Your Plate Scores:   Nutrition Goals Re-Evaluation:   Psychosocial: Target Goals: Acknowledge presence or absence of depression, maximize coping skills, provide positive support system. Participant is able to verbalize types and ability to use techniques and skills needed for reducing stress and depression.  Initial Review & Psychosocial Screening:     Initial Psych Review & Screening - 03/18/15 0826    Family Dynamics   Good Support System? Yes   Barriers   Psychosocial barriers to participate in program There are no identifiable barriers or psychosocial needs.;The patient should benefit from training in stress management and relaxation.   Screening Interventions   Interventions Encouraged to exercise;Program counselor  consult      Quality of Life Scores:     Quality of Life - 03/18/15 1628    Quality of Life Scores   Health/Function Pre 22.8 %   Socioeconomic Pre 30 %   Psych/Spiritual Pre 29.14 %   Family Pre 30 %   GLOBAL Pre 26.44 %      PHQ-9:     Recent Review Flowsheet Data    Depression screen [facility] 2/9 03/18/2015   Decreased Interest 0   Down, Depressed, Hopeless 0   PHQ - 2 Score 0   Altered sleeping 0   Tired, decreased energy 1   Change in appetite 0   Feeling bad or failure about yourself  0   Trouble concentrating 0   Moving slowly or fidgety/restless 0   Suicidal thoughts 0   PHQ-9 Score 1   Difficult doing work/chores Somewhat difficult      Psychosocial Evaluation and Intervention:     Psychosocial Evaluation - 04/01/15 0943    Psychosocial Evaluation & Interventions   Interventions Encouraged to exercise with the program and follow exercise prescription   Comments Counselor met with Mr. Milks for a psychosocial evaluation.  He is an almost 80 year old (in 2 weeks) who has a history of heart attacks, A Fib and Congestive Heart Failure.  He has a spouse of 58 years and reports he is sleeping  and eating well at this time.   He denies a history of depression or anxiety or current symptoms.  He reports his mood is typically positive although he gets a little concerned at times about his health issues and some property sale transaction that are currently going on in his life.  His goal for this program is to walk without "wearing out" and increase his stamina and strength.  He plans to participate in a follow up program after graduating from this program.     Continued Psychosocial Services Needed --  Mr. Fotheringham wil benefit from the psychoeducational components of this program, especially on stress management and relaxation.        Psychosocial Re-Evaluation:   Vocational Rehabilitation: Provide vocational rehab assistance to qualifying candidates.   Vocational Rehab Evaluation & Intervention:     Vocational Rehab - 03/18/15 0817    Initial Vocational Rehab Evaluation & Intervention   Assessment shows need for Vocational Rehabilitation No      Education: Education Goals: Education classes will be provided on a weekly basis, covering required topics. Participant will state understanding/return demonstration of topics presented.  Learning Barriers/Preferences:     Learning Barriers/Preferences - 03/18/15 0817    Learning Barriers/Preferences   Learning Barriers None   Learning Preferences None      Education Topics: General Nutrition Guidelines/Fats and Fiber: -Group instruction provided by verbal, written material, models and posters to present the general guidelines for heart healthy nutrition. Gives an explanation and review of dietary fats and fiber.   Controlling Sodium/Reading Food Labels: -Group verbal and written material supporting the discussion of sodium use in heart healthy nutrition. Review and explanation with models, verbal and written materials for utilization of the food label.   Exercise Physiology & Risk Factors: - Group verbal and written  instruction with models to review the exercise physiology of the cardiovascular system and associated critical values. Details cardiovascular disease risk factors and the goals associated with each risk factor.   Aerobic Exercise & Resistance Training: - Gives group verbal and written discussion on the health impact of  inactivity. On the components of aerobic and resistive training programs and the benefits of this training and how to safely progress through these programs.   Flexibility, Balance, General Exercise Guidelines: - Provides group verbal and written instruction on the benefits of flexibility and balance training programs. Provides general exercise guidelines with specific guidelines to those with heart or lung disease. Demonstration and skill practice provided.   Stress Management: - Provides group verbal and written instruction about the health risks of elevated stress, cause of high stress, and healthy ways to reduce stress.   Depression: - Provides group verbal and written instruction on the correlation between heart/lung disease and depressed mood, treatment options, and the stigmas associated with seeking treatment.   Anatomy & Physiology of the Heart: - Group verbal and written instruction and models provide basic cardiac anatomy and physiology, with the coronary electrical and arterial systems. Review of: AMI, Angina, Valve disease, Heart Failure, Cardiac Arrhythmia, Pacemakers, and the ICD.   Cardiac Procedures: - Group verbal and written instruction and models to describe the testing methods done to diagnose heart disease. Reviews the outcomes of the test results. Describes the treatment choices: Medical Management, Angioplasty, or Coronary Bypass Surgery.   Cardiac Medications: - Group verbal and written instruction to review commonly prescribed medications for heart disease. Reviews the medication, class of the drug, and side effects. Includes the steps to properly  store meds and maintain the prescription regimen.   Go Sex-Intimacy & Heart Disease, Get SMART - Goal Setting: - Group verbal and written instruction through game format to discuss heart disease and the return to sexual intimacy. Provides group verbal and written material to discuss and apply goal setting through the application of the S.M.A.R.T. Method.   Other Matters of the Heart: - Provides group verbal, written materials and models to describe Heart Failure, Angina, Valve Disease, and Diabetes in the realm of heart disease. Includes description of the disease process and treatment options available to the cardiac patient.   Exercise & Equipment Safety: - Individual verbal instruction and demonstration of equipment use and safety with use of the equipment.          Cardiac Rehab from 03/18/2015 in [facility]   Date  03/18/15   Educator  SB   Instruction Review Code  2- meets goals/outcomes      Infection Prevention: - Provides verbal and written material to individual with discussion of infection control including proper hand washing and proper equipment cleaning during exercise session.      Cardiac Rehab from 03/18/2015 in [facility]   Date  03/18/15   Educator  SB   Instruction Review Code  2- meets goals/outcomes      Falls Prevention: - Provides verbal and written material to individual with discussion of falls prevention and safety.      Cardiac Rehab from 03/18/2015 in [facility]   Date  03/18/15   Educator  Sb   Instruction Review Code  2- meets goals/outcomes      Diabetes: - Individual verbal and written instruction to review signs/symptoms of diabetes, desired ranges of glucose level fasting, after meals and with exercise. Advice that pre and post exercise glucose checks will be done for 3 sessions at entry of program.    Knowledge Questionnaire Score:     Knowledge Questionnaire Score - 03/18/15 1625    Knowledge Questionnaire  Score   Pre Score 23/28      Personal Goals and Risk Factors at  Admission:     Personal Goals and Risk Factors at Admission - 03/18/15 0824    Personal Goals and Risk Factors on Admission    Weight Management Yes   Intervention Learn and follow the exercise and diet guidelines while in the program. Utilize the nutrition and education classes to help gain knowledge of the diet and exercise expectations in the program   Increase Aerobic Exercise and Physical Activity Yes;Sedentary   Intervention While in program, learn and follow the exercise prescription taught. Start at a low level workload and increase workload after able to maintain previous level for 30 minutes. Increase time before increasing intensity.   Intervention Provide exercise education and an individualized exercise prescription that will provide continued progressive overload as per policy without signs/symptoms of physical distress.   Diabetes No   Hypertension No   Lipids Yes   Goal Cholesterol controlled with medications as prescribed, with individualized exercise RX and with personalized nutrition plan. Value goals: LDL < 46m, HDL > 448m Participant states understanding of desired cholesterol values and following prescriptions.   Intervention Provide nutrition & aerobic exercise along with prescribed medications to achieve LDL <7026mHDL >25m54m Stress Yes   Goal To meet with psychosocial counselor for stress and relaxation information and guidance. To state understanding of performing relaxation techniques and or identifying personal stressors.   Intervention Provide education on types of stress, identifiying stressors, and ways to cope with stress. Provide demonstration and active practice of relaxation techniques.      Personal Goals and Risk Factors Review:    Personal Goals Discharge:     Comments: 30 day review Has attended 3 session  Continue with ITP

## 2015-04-09 ENCOUNTER — Other Ambulatory Visit: Payer: Self-pay | Admitting: *Deleted

## 2015-04-09 DIAGNOSIS — I5022 Chronic systolic (congestive) heart failure: Secondary | ICD-10-CM

## 2015-04-17 ENCOUNTER — Encounter: Payer: Self-pay | Admitting: *Deleted

## 2015-04-17 ENCOUNTER — Telehealth: Payer: Self-pay | Admitting: *Deleted

## 2015-04-17 DIAGNOSIS — I5022 Chronic systolic (congestive) heart failure: Secondary | ICD-10-CM

## 2015-04-17 NOTE — Progress Notes (Signed)
Cardiac Individual Treatment Plan  Patient Details  Name: Jeffrey Gallegos MRN: [id number] Date of Birth: [birth year] Referring Provider:  No ref. provider found  Initial Encounter Date:    Visit Diagnosis: Chronic systolic congestive heart failure  Patient's Home Medications on Admission:  Current outpatient prescriptions:  .  Acetaminophen (TYLENOL PO), Take by mouth as needed., Disp: , Rfl:  .  amiodarone (PACERONE) 200 MG tablet, Take 200 mg by mouth daily. , Disp: , Rfl:  .  atorvastatin (LIPITOR) 80 MG tablet, Take 20 mg by mouth every morning. Every evening, Disp: , Rfl:  .  carvedilol (COREG) 3.125 MG tablet, Take 1 tablet by mouth 2 (two) times daily., Disp: , Rfl:  .  DABIGATRAN ETEXILATE MESYLATE PO, Take by mouth., Disp: , Rfl:  .  digoxin (LANOXIN) 0.125 MG tablet, Take 1 tablet by mouth daily., Disp: , Rfl:  .  docusate sodium (COLACE) 100 MG capsule, Take 100 mg by mouth daily as needed for mild constipation., Disp: , Rfl:  .  furosemide (LASIX) 20 MG tablet, Take 1 tablet by mouth 3 (three) times a week., Disp: , Rfl:  .  hydrocortisone (ANUSOL-HC) 2.5 % rectal cream, Place 1 application rectally. 2-3 times daily as needed, Disp: , Rfl:  .  levothyroxine (SYNTHROID, LEVOTHROID) 112 MCG tablet, Take 1 tablet (112 mcg total) by mouth daily before breakfast., Disp: 30 tablet, Rfl: 3 .  naproxen sodium (ANAPROX) 220 MG tablet, Take 220 mg by mouth 2 (two) times daily with a meal. PRN for pain, Disp: , Rfl:  .  niacin 500 MG tablet, Take 3 tablets by mouth 1 day or 1 dose., Disp: , Rfl:  .  nitroGLYCERIN (NITROSTAT) 0.4 MG SL tablet, Place 0.4 mg under the tongue every 5 (five) minutes as needed for chest pain., Disp: , Rfl:  .  pantoprazole (PROTONIX) 40 MG tablet, TAKE ONE (1) TABLET BY MOUTH EVERY DAY, Disp: 30 tablet, Rfl: 12 .  Potassium Chloride ER 20 MEQ TBCR, Take 2 tablets by mouth daily., Disp: , Rfl:  .  ramipril (ALTACE) 2.5 MG capsule, Take 2.5 mg by mouth daily.,  Disp: , Rfl:  .  Saw Palmetto, Serenoa repens, 450 MG CAPS, Take 3 tablets by mouth 2 (two) times daily., Disp: , Rfl:  .  tadalafil (CIALIS) 5 MG tablet, Take 1 tablet by mouth daily., Disp: , Rfl:  .  torsemide (DEMADEX) 10 MG tablet, Take by mouth. Takes 20 in the morning and 10 in the evening, Disp: , Rfl:   Past Medical History: Past Medical History  Diagnosis Date  . History of MI (myocardial infarction) 2011  . History of mumps     Childhood  . Coronary artery disease   . Myocardial infarction   . Hypertension   . Sleep apnea   . Thyroid disease     synthroid    Tobacco Use: History  Smoking status  . Former Smoker  . Types: Cigarettes  . Quit date: 07/26/1968  Smokeless tobacco  . Current User  . Types: Chew    Comment: Quit smoking in the 1970s    Labs: Recent Review Scientist, physiological    Labs for ITP Cardiac and Pulmonary Rehab Latest Ref Rng 08/26/2014   Cholestrol 0 - 200 mg/dL 56   LDLCALC - 19   HDL 35 - 70 mg/dL 24(A)   Trlycerides 40 - 160 mg/dL 65   Hemoglobin A1c 4.0 - 6.0 % 6.7(A)       Exercise  Target Goals:    Exercise Program Goal: Individual exercise prescription set with THRR, safety & activity barriers. Participant demonstrates ability to understand and report RPE using BORG scale, to self-measure pulse accurately, and to acknowledge the importance of the exercise prescription.  Exercise Prescription Goal: Starting with aerobic activity 30 plus minutes a day, 3 days per week for initial exercise prescription. Provide home exercise prescription and guidelines that participant acknowledges understanding prior to discharge.  Activity Barriers & Risk Stratification:     Activity Barriers & Risk Stratification - 03/18/15 0816    Activity Barriers & Risk Stratification   Activity Barriers None;Deconditioning   Risk Stratification High      6 Minute Walk:     6 Minute Walk      03/18/15 1024       6 Minute Walk   Phase Initial      Distance 800 feet     Walk Time 5.38 minutes     Resting HR 61 bpm     Resting BP 108/52 mmHg     Max Ex. HR 114 bpm     Max Ex. BP 100/50 mmHg     RPE 15     Symptoms No        Initial Exercise Prescription:     Initial Exercise Prescription - 03/18/15 1000    Date of Initial Exercise Prescription   Date 03/18/15   Treadmill   MPH 1.5   Grade 0   Minutes 10   Bike   Level 0.2   Watts 10   Minutes 10   Recumbant Bike   Level 2   RPM 40   Watts 15   Minutes 10   NuStep   Level 2   Watts 30   Minutes 15   Arm Ergometer   Level 1   Watts 8   Minutes 10   Arm/Foot Ergometer   Level 4   Watts 12   Minutes 10   Cybex   Level 1   RPM 50   Minutes 10   Recumbant Elliptical   Level 1   RPM 40   Watts 10   Minutes 10   Elliptical   Level 1   Speed 3   Minutes 1   REL-XR   Level 2   Watts 35   Minutes 15   Prescription Details   Frequency (times per week) 3   Duration Progress to 30 minutes of continuous aerobic without signs/symptoms of physical distress   Intensity   THRR REST +  30   Ratings of Perceived Exertion 11-15   Progression Continue progressive overload as per policy without signs/symptoms or physical distress.   Resistance Training   Training Prescription Yes   Weight 2   Reps 10-15      Exercise Prescription Changes:     Exercise Prescription Changes      04/03/15 1400           Exercise Review   Progression Yes       Response to Exercise   Blood Pressure (Admit) 110/60 mmHg       Blood Pressure (Exercise) 126/56 mmHg       Blood Pressure (Exit) 108/60 mmHg       Heart Rate (Admit) 62 bpm       Heart Rate (Exercise) 76 bpm       Heart Rate (Exit) 56 bpm       Rating of Perceived Exertion (Exercise) 13  Symptoms No       Duration Progress to 30 minutes of continuous aerobic without signs/symptoms of physical distress       Intensity Rest + 30       Progression Continue progressive overload as per policy without  signs/symptoms or physical distress.       Resistance Training   Training Prescription Yes       Weight 3       Reps 10-15       Interval Training   Interval Training No       Treadmill   MPH 1.5       Grade 0       Minutes 15       NuStep   Level 2       Watts 25       Minutes 15          Discharge Exercise Prescription (Final Exercise Prescription Changes):     Exercise Prescription Changes - 04/03/15 1400    Exercise Review   Progression Yes   Response to Exercise   Blood Pressure (Admit) 110/60 mmHg   Blood Pressure (Exercise) 126/56 mmHg   Blood Pressure (Exit) 108/60 mmHg   Heart Rate (Admit) 62 bpm   Heart Rate (Exercise) 76 bpm   Heart Rate (Exit) 56 bpm   Rating of Perceived Exertion (Exercise) 13   Symptoms No   Duration Progress to 30 minutes of continuous aerobic without signs/symptoms of physical distress   Intensity Rest + 30   Progression Continue progressive overload as per policy without signs/symptoms or physical distress.   Resistance Training   Training Prescription Yes   Weight 3   Reps 10-15   Interval Training   Interval Training No   Treadmill   MPH 1.5   Grade 0   Minutes 15   NuStep   Level 2   Watts 25   Minutes 15      Nutrition:  Target Goals: Understanding of nutrition guidelines, daily intake of sodium <152m, cholesterol <2074m calories 30% from fat and 7% or less from saturated fats, daily to have 5 or more servings of fruits and vegetables.  Biometrics:     Pre Biometrics - 03/18/15 1021    Pre Biometrics   Height 5' 8.5" (1.74 m)   Weight 142 lb 8 oz (64.638 kg)   Waist Circumference 36.5 inches   Hip Circumference 36.25 inches   Waist to Hip Ratio 1.01 %   BMI (Calculated) 21.4       Nutrition Therapy Plan and Nutrition Goals:     Nutrition Therapy & Goals - 04/08/15 0729    Intervention Plan   Intervention Using nutrition plan and personal goals to gain a healthy nutrition lifestyle. Add exercise as  prescribed.      Nutrition Discharge: Rate Your Plate Scores:   Nutrition Goals Re-Evaluation:     Nutrition Goals Re-Evaluation      04/17/15 1253           Personal Goal #1 Re-Evaluation   Personal Goal #1 Will see his kidney doctor tomorrow and go from there.           Psychosocial: Target Goals: Acknowledge presence or absence of depression, maximize coping skills, provide positive support system. Participant is able to verbalize types and ability to use techniques and skills needed for reducing stress and depression.  Initial Review & Psychosocial Screening:     Initial Psych Review & Screening - 03/18/15 081607  Family Dynamics   Good Support System? Yes   Barriers   Psychosocial barriers to participate in program There are no identifiable barriers or psychosocial needs.;The patient should benefit from training in stress management and relaxation.   Screening Interventions   Interventions Encouraged to exercise;Program counselor consult      Quality of Life Scores:     Quality of Life - 03/18/15 1628    Quality of Life Scores   Health/Function Pre 22.8 %   Socioeconomic Pre 30 %   Psych/Spiritual Pre 29.14 %   Family Pre 30 %   GLOBAL Pre 26.44 %      PHQ-9:     Recent Review Flowsheet Data    Depression screen [facility] 2/9 03/18/2015   Decreased Interest 0   Down, Depressed, Hopeless 0   PHQ - 2 Score 0   Altered sleeping 0   Tired, decreased energy 1   Change in appetite 0   Feeling bad or failure about yourself  0   Trouble concentrating 0   Moving slowly or fidgety/restless 0   Suicidal thoughts 0   PHQ-9 Score 1   Difficult doing work/chores Somewhat difficult      Psychosocial Evaluation and Intervention:     Psychosocial Evaluation - 04/01/15 0943    Psychosocial Evaluation & Interventions   Interventions Encouraged to exercise with the program and follow exercise prescription   Comments Counselor met with Jeffrey Gallegos for a  psychosocial evaluation.  He is an almost 80 year old (in 2 weeks) who has a history of heart attacks, A Fib and Congestive Heart Failure.  He has a spouse of 32 years and reports he is sleeping and eating well at this time.   He denies a history of depression or anxiety or current symptoms.  He reports his mood is typically positive although he gets a little concerned at times about his health issues and some property sale transaction that are currently going on in his life.  His goal for this program is to walk without "wearing out" and increase his stamina and strength.  He plans to participate in a follow up program after graduating from this program.     Continued Psychosocial Services Needed --  Jeffrey Gallegos wil benefit from the psychoeducational components of this program, especially on stress management and relaxation.        Psychosocial Re-Evaluation:   Vocational Rehabilitation: Provide vocational rehab assistance to qualifying candidates.   Vocational Rehab Evaluation & Intervention:     Vocational Rehab - 03/18/15 0817    Initial Vocational Rehab Evaluation & Intervention   Assessment shows need for Vocational Rehabilitation No      Education: Education Goals: Education classes will be provided on a weekly basis, covering required topics. Participant will state understanding/return demonstration of topics presented.  Learning Barriers/Preferences:     Learning Barriers/Preferences - 03/18/15 0817    Learning Barriers/Preferences   Learning Barriers None   Learning Preferences None      Education Topics: General Nutrition Guidelines/Fats and Fiber: -Group instruction provided by verbal, written material, models and posters to present the general guidelines for heart healthy nutrition. Gives an explanation and review of dietary fats and fiber.   Controlling Sodium/Reading Food Labels: -Group verbal and written material supporting the discussion of sodium use in  heart healthy nutrition. Review and explanation with models, verbal and written materials for utilization of the food label.   Exercise Physiology & Risk Factors: - Group verbal and written  instruction with models to review the exercise physiology of the cardiovascular system and associated critical values. Details cardiovascular disease risk factors and the goals associated with each risk factor.   Aerobic Exercise & Resistance Training: - Gives group verbal and written discussion on the health impact of inactivity. On the components of aerobic and resistive training programs and the benefits of this training and how to safely progress through these programs.   Flexibility, Balance, General Exercise Guidelines: - Provides group verbal and written instruction on the benefits of flexibility and balance training programs. Provides general exercise guidelines with specific guidelines to those with heart or lung disease. Demonstration and skill practice provided.   Stress Management: - Provides group verbal and written instruction about the health risks of elevated stress, cause of high stress, and healthy ways to reduce stress.   Depression: - Provides group verbal and written instruction on the correlation between heart/lung disease and depressed mood, treatment options, and the stigmas associated with seeking treatment.   Anatomy & Physiology of the Heart: - Group verbal and written instruction and models provide basic cardiac anatomy and physiology, with the coronary electrical and arterial systems. Review of: AMI, Angina, Valve disease, Heart Failure, Cardiac Arrhythmia, Pacemakers, and the ICD.   Cardiac Procedures: - Group verbal and written instruction and models to describe the testing methods done to diagnose heart disease. Reviews the outcomes of the test results. Describes the treatment choices: Medical Management, Angioplasty, or Coronary Bypass Surgery.   Cardiac  Medications: - Group verbal and written instruction to review commonly prescribed medications for heart disease. Reviews the medication, class of the drug, and side effects. Includes the steps to properly store meds and maintain the prescription regimen.   Go Sex-Intimacy & Heart Disease, Get SMART - Goal Setting: - Group verbal and written instruction through game format to discuss heart disease and the return to sexual intimacy. Provides group verbal and written material to discuss and apply goal setting through the application of the S.M.A.R.T. Method.   Other Matters of the Heart: - Provides group verbal, written materials and models to describe Heart Failure, Angina, Valve Disease, and Diabetes in the realm of heart disease. Includes description of the disease process and treatment options available to the cardiac patient.   Exercise & Equipment Safety: - Individual verbal instruction and demonstration of equipment use and safety with use of the equipment.          Cardiac Rehab from 03/18/2015 in [facility] Cardiac Rehab   Date  03/18/15   Educator  SB   Instruction Review Code  2- meets goals/outcomes      Infection Prevention: - Provides verbal and written material to individual with discussion of infection control including proper hand washing and proper equipment cleaning during exercise session.      Cardiac Rehab from 03/18/2015 in [facility] Cardiac Rehab   Date  03/18/15   Educator  SB   Instruction Review Code  2- meets goals/outcomes      Falls Prevention: - Provides verbal and written material to individual with discussion of falls prevention and safety.      Cardiac Rehab from 03/18/2015 in [facility] Cardiac Rehab   Date  03/18/15   Educator  Sb   Instruction Review Code  2- meets goals/outcomes      Diabetes: - Individual verbal and written instruction to review signs/symptoms of diabetes, desired ranges of glucose level fasting, after meals and with exercise. Advice that pre  and post exercise glucose checks  will be done for 3 sessions at entry of program.    Knowledge Questionnaire Score:     Knowledge Questionnaire Score - 03/18/15 1625    Knowledge Questionnaire Score   Pre Score 23/28      Personal Goals and Risk Factors at Admission:     Personal Goals and Risk Factors at Admission - 03/18/15 0824    Personal Goals and Risk Factors on Admission    Weight Management Yes   Intervention Learn and follow the exercise and diet guidelines while in the program. Utilize the nutrition and education classes to help gain knowledge of the diet and exercise expectations in the program   Increase Aerobic Exercise and Physical Activity Yes;Sedentary   Intervention While in program, learn and follow the exercise prescription taught. Start at a low level workload and increase workload after able to maintain previous level for 30 minutes. Increase time before increasing intensity.   Intervention Provide exercise education and an individualized exercise prescription that will provide continued progressive overload as per policy without signs/symptoms of physical distress.   Diabetes No   Hypertension No   Lipids Yes   Goal Cholesterol controlled with medications as prescribed, with individualized exercise RX and with personalized nutrition plan. Value goals: LDL < 9m, HDL > 459m Participant states understanding of desired cholesterol values and following prescriptions.   Intervention Provide nutrition & aerobic exercise along with prescribed medications to achieve LDL <7080mHDL >58m4m Stress Yes   Goal To meet with psychosocial counselor for stress and relaxation information and guidance. To state understanding of performing relaxation techniques and or identifying personal stressors.   Intervention Provide education on types of stress, identifiying stressors, and ways to cope with stress. Provide demonstration and active practice of relaxation techniques.       Personal Goals and Risk Factors Review:      Goals and Risk Factor Review      04/17/15 1253           Increase Aerobic Exercise and Physical Activity   Goals Progress/Improvement seen  No       Comments I called Jeffrey Gallegos he said "the exercise is about to kill me". I exercised on Thursday and needed Tuesday to get ready. Now I feel the [place] is taking my strength. MD increased Torsemide but he still have feet swelling,. Jeffrey Gallegos that he is going to the Nephologist tomorrow (Kidney doctor). He will check with his MD. I told him that they can bring him down in wheelchair but he really doesn't want to do.          Personal Goals Discharge (Final Personal Goals and Risk Factors Review):      Goals and Risk Factor Review - 04/17/15 1253    Increase Aerobic Exercise and Physical Activity   Goals Progress/Improvement seen  No   Comments I called Jeffrey Gallegos he said "the exercise is about to kill me". I exercised on Thursday and needed Tuesday to get ready. Now I feel the [place] is taking my strength. MD increased Torsemide but he still have feet swelling,. Jeffrey Suskiorts that he is going to the Nephologist tomorrow (Kidney doctor). He will check with his MD. I told him that they can bring him down in wheelchair but he really doesn't want to do.       Comments: EddiSabirts to be kept on the Cardiac Rehab roster and hopes to return and will consult his MD. I  encouraged him to return since it should help with his endurance and not "being so tired".

## 2015-04-17 NOTE — Telephone Encounter (Signed)
I called Jeffrey Gallegos and he said "the exercise is about to kill me". I exercised on Thursday and needed Tuesday to get ready. Now I feel the [place] is taking my strength. MD increased Torsemide but he still have feet swelling,. Jeffrey Gallegos reports that he is going to the Nephologist tomorrow (Kidney doctor). He will check with his MD. I told him that they can bring him down in wheelchair but he really doesn't want to do.

## 2015-04-18 NOTE — Telephone Encounter (Signed)
Cardiac Rehab

## 2015-04-21 ENCOUNTER — Other Ambulatory Visit: Payer: Self-pay | Admitting: Family Medicine

## 2015-04-24 ENCOUNTER — Ambulatory Visit (INDEPENDENT_AMBULATORY_CARE_PROVIDER_SITE_OTHER): Payer: Medicare Other | Admitting: Family Medicine

## 2015-04-24 ENCOUNTER — Encounter: Payer: Self-pay | Admitting: Family Medicine

## 2015-04-24 VITALS — BP 100/58 | HR 63 | Temp 97.5°F | Resp 16 | Wt 146.8 lb

## 2015-04-24 DIAGNOSIS — L299 Pruritus, unspecified: Secondary | ICD-10-CM

## 2015-04-24 DIAGNOSIS — N183 Chronic kidney disease, stage 3 unspecified: Secondary | ICD-10-CM

## 2015-04-24 NOTE — Progress Notes (Signed)
Subjective:     Patient ID: Jeffrey Gallegos, male   DOB: August 27, 1934, 80 y.o.   MRN: [id number]  HPI  Chief Complaint  Patient presents with  . Pruritis    Patient comes in office today with concerns of the skin. Patient reports that his skin has been itchy for the past week and gradually getting worse. Patient denies starting any new medication or change in detergent/shampoo he states he recently changed in his regimen drinking milk and orange juice which he states he had not before.   Recently was seen by renal for a low Stage 3 CKD. F/u is pending 10/10. He states due to his weight going up he has increased his torsemide to 3 pills twice daily this week. He tries to maintain his weight < 149.    Review of Systems  Respiratory: Negative for shortness of breath.   Skin: Negative for rash.       Objective:   Physical Exam  Cardiovascular:  Murmur (3/6 systolic murmur) heard. Pulmonary/Chest: Breath sounds normal. No respiratory distress.  Musculoskeletal: He exhibits no edema (of lower extremties).  Skin: No rash noted.   Chronically ill appearing 80 year old in no acute distress.    Assessment:    1. Itching: suspect secondary to worsening CKD. - Comprehensive metabolic panel  2. Chronic kidney disease, stage 3 (moderate) - Comprehensive metabolic panel    Plan:    Will try Claritin but wishes to hold on labs for now. Provided with lab slip if itching not improving.

## 2015-04-24 NOTE — Patient Instructions (Signed)
Try Claritin for itching. Get labs if itching does not improve.

## 2015-04-29 ENCOUNTER — Encounter: Payer: Medicare Other | Attending: Pediatric Cardiology

## 2015-04-29 DIAGNOSIS — I5022 Chronic systolic (congestive) heart failure: Secondary | ICD-10-CM | POA: Insufficient documentation

## 2015-05-07 ENCOUNTER — Encounter: Payer: Self-pay | Admitting: *Deleted

## 2015-05-07 DIAGNOSIS — I5022 Chronic systolic (congestive) heart failure: Secondary | ICD-10-CM

## 2015-05-07 NOTE — Progress Notes (Signed)
Cardiac Individual Treatment Plan  Patient Details  Name: Jeffrey Gallegos MRN: [id number] Date of Birth: 16-Aug-1934 Referring Provider:  No ref. provider found  Initial Encounter Date:    Visit Diagnosis: No diagnosis found.  Patient's Home Medications on Admission:  Current outpatient prescriptions:  .  Acetaminophen (TYLENOL PO), Take by mouth as needed., Disp: , Rfl:  .  amiodarone (PACERONE) 200 MG tablet, Take 200 mg by mouth daily. , Disp: , Rfl:  .  atorvastatin (LIPITOR) 80 MG tablet, TAKE ONE (1) TABLET BY MOUTH EVERY DAY, Disp: 30 tablet, Rfl: 0 .  carvedilol (COREG) 3.125 MG tablet, Take 1 tablet by mouth 2 (two) times daily., Disp: , Rfl:  .  DABIGATRAN ETEXILATE MESYLATE PO, Take by mouth., Disp: , Rfl:  .  digoxin (LANOXIN) 0.125 MG tablet, Take 1 tablet by mouth daily., Disp: , Rfl:  .  docusate sodium (COLACE) 100 MG capsule, Take 100 mg by mouth daily as needed for mild constipation., Disp: , Rfl:  .  ELIQUIS 5 MG TABS tablet, , Disp: , Rfl: 2 .  furosemide (LASIX) 20 MG tablet, Take 1 tablet by mouth 3 (three) times a week., Disp: , Rfl:  .  hydrocortisone (ANUSOL-HC) 2.5 % rectal cream, Place 1 application rectally. 2-3 times daily as needed, Disp: , Rfl:  .  levothyroxine (SYNTHROID, LEVOTHROID) 100 MCG tablet, , Disp: , Rfl: 5 .  levothyroxine (SYNTHROID, LEVOTHROID) 112 MCG tablet, Take 1 tablet (112 mcg total) by mouth daily before breakfast., Disp: 30 tablet, Rfl: 3 .  naproxen sodium (ANAPROX) 220 MG tablet, Take 220 mg by mouth 2 (two) times daily with a meal. PRN for pain, Disp: , Rfl:  .  niacin 500 MG tablet, Take 3 tablets by mouth 1 day or 1 dose., Disp: , Rfl:  .  nitroGLYCERIN (NITROSTAT) 0.4 MG SL tablet, Place 0.4 mg under the tongue every 5 (five) minutes as needed for chest pain., Disp: , Rfl:  .  pantoprazole (PROTONIX) 40 MG tablet, TAKE ONE (1) TABLET BY MOUTH EVERY DAY, Disp: 30 tablet, Rfl: 12 .  Potassium Chloride ER 20 MEQ TBCR, Take 2 tablets  by mouth daily., Disp: , Rfl:  .  ramipril (ALTACE) 2.5 MG capsule, Take 2.5 mg by mouth daily., Disp: , Rfl:  .  Saw Palmetto, Serenoa repens, 450 MG CAPS, Take 3 tablets by mouth 2 (two) times daily., Disp: , Rfl:  .  spironolactone (ALDACTONE) 25 MG tablet, , Disp: , Rfl: 2 .  tadalafil (CIALIS) 5 MG tablet, Take 1 tablet by mouth daily., Disp: , Rfl:  .  torsemide (DEMADEX) 10 MG tablet, Take by mouth. Takes 20 in the morning and 10 in the evening, Disp: , Rfl:   Past Medical History: Past Medical History  Diagnosis Date  . History of MI (myocardial infarction) 2011  . History of mumps     Childhood  . Coronary artery disease   . Myocardial infarction   . Hypertension   . Sleep apnea   . Thyroid disease     synthroid  . Anemia   . Vitamin D deficiency     Tobacco Use: History  Smoking status  . Former Smoker  . Types: Cigarettes  . Quit date: 07/26/1968  Smokeless tobacco  . Current User  . Types: Chew    Comment: Quit smoking in the 1970s    Labs: Recent Review Scientist, physiological    Labs for ITP Cardiac and Pulmonary Rehab Latest Ref Rng 08/26/2014  Cholestrol 0 - 200 mg/dL 56   LDLCALC - 19   HDL 35 - 70 mg/dL 24(A)   Trlycerides 40 - 160 mg/dL 65   Hemoglobin A1c 4.0 - 6.0 % 6.7(A)       Exercise Target Goals:    Exercise Program Goal: Individual exercise prescription set with THRR, safety & activity barriers. Participant demonstrates ability to understand and report RPE using BORG scale, to self-measure pulse accurately, and to acknowledge the importance of the exercise prescription.  Exercise Prescription Goal: Starting with aerobic activity 30 plus minutes a day, 3 days per week for initial exercise prescription. Provide home exercise prescription and guidelines that participant acknowledges understanding prior to discharge.  Activity Barriers & Risk Stratification:     Activity Barriers & Risk Stratification - 03/18/15 0816    Activity Barriers & Risk  Stratification   Activity Barriers None;Deconditioning   Risk Stratification High      6 Minute Walk:     6 Minute Walk      03/18/15 1024       6 Minute Walk   Phase Initial     Distance 800 feet     Walk Time 5.38 minutes     Resting HR 61 bpm     Resting BP 108/52 mmHg     Max Ex. HR 114 bpm     Max Ex. BP 100/50 mmHg     RPE 15     Symptoms No        Initial Exercise Prescription:     Initial Exercise Prescription - 03/18/15 1000    Date of Initial Exercise Prescription   Date 03/18/15   Treadmill   MPH 1.5   Grade 0   Minutes 10   Bike   Level 0.2   Watts 10   Minutes 10   Recumbant Bike   Level 2   RPM 40   Watts 15   Minutes 10   NuStep   Level 2   Watts 30   Minutes 15   Arm Ergometer   Level 1   Watts 8   Minutes 10   Arm/Foot Ergometer   Level 4   Watts 12   Minutes 10   Cybex   Level 1   RPM 50   Minutes 10   Recumbant Elliptical   Level 1   RPM 40   Watts 10   Minutes 10   Elliptical   Level 1   Speed 3   Minutes 1   REL-XR   Level 2   Watts 35   Minutes 15   Prescription Details   Frequency (times per week) 3   Duration Progress to 30 minutes of continuous aerobic without signs/symptoms of physical distress   Intensity   THRR REST +  30   Ratings of Perceived Exertion 11-15   Progression Continue progressive overload as per policy without signs/symptoms or physical distress.   Resistance Training   Training Prescription Yes   Weight 2   Reps 10-15      Exercise Prescription Changes:     Exercise Prescription Changes      04/03/15 1400           Exercise Review   Progression Yes       Response to Exercise   Blood Pressure (Admit) 110/60 mmHg       Blood Pressure (Exercise) 126/56 mmHg       Blood Pressure (Exit) 108/60 mmHg  Heart Rate (Admit) 62 bpm       Heart Rate (Exercise) 76 bpm       Heart Rate (Exit) 56 bpm       Rating of Perceived Exertion (Exercise) 13       Symptoms No        Duration Progress to 30 minutes of continuous aerobic without signs/symptoms of physical distress       Intensity Rest + 30       Progression Continue progressive overload as per policy without signs/symptoms or physical distress.       Resistance Training   Training Prescription Yes       Weight 3       Reps 10-15       Interval Training   Interval Training No       Treadmill   MPH 1.5       Grade 0       Minutes 15       NuStep   Level 2       Watts 25       Minutes 15          Discharge Exercise Prescription (Final Exercise Prescription Changes):     Exercise Prescription Changes - 04/03/15 1400    Exercise Review   Progression Yes   Response to Exercise   Blood Pressure (Admit) 110/60 mmHg   Blood Pressure (Exercise) 126/56 mmHg   Blood Pressure (Exit) 108/60 mmHg   Heart Rate (Admit) 62 bpm   Heart Rate (Exercise) 76 bpm   Heart Rate (Exit) 56 bpm   Rating of Perceived Exertion (Exercise) 13   Symptoms No   Duration Progress to 30 minutes of continuous aerobic without signs/symptoms of physical distress   Intensity Rest + 30   Progression Continue progressive overload as per policy without signs/symptoms or physical distress.   Resistance Training   Training Prescription Yes   Weight 3   Reps 10-15   Interval Training   Interval Training No   Treadmill   MPH 1.5   Grade 0   Minutes 15   NuStep   Level 2   Watts 25   Minutes 15      Nutrition:  Target Goals: Understanding of nutrition guidelines, daily intake of sodium <1532m, cholesterol <2018m calories 30% from fat and 7% or less from saturated fats, daily to have 5 or more servings of fruits and vegetables.  Biometrics:     Pre Biometrics - 03/18/15 1021    Pre Biometrics   Height 5' 8.5" (1.74 m)   Weight 142 lb 8 oz (64.638 kg)   Waist Circumference 36.5 inches   Hip Circumference 36.25 inches   Waist to Hip Ratio 1.01 %   BMI (Calculated) 21.4       Nutrition Therapy Plan and  Nutrition Goals:     Nutrition Therapy & Goals - 04/08/15 0729    Intervention Plan   Intervention Using nutrition plan and personal goals to gain a healthy nutrition lifestyle. Add exercise as prescribed.      Nutrition Discharge: Rate Your Plate Scores:     Rate Your Plate - 0916/10/9660454  Rate Your Plate Scores   Pre Score 78   Pre Score % 87 %      Nutrition Goals Re-Evaluation:     Nutrition Goals Re-Evaluation      04/17/15 1253           Personal Goal #1 Re-Evaluation  Personal Goal #1 Will see his kidney doctor tomorrow and go from there.           Psychosocial: Target Goals: Acknowledge presence or absence of depression, maximize coping skills, provide positive support system. Participant is able to verbalize types and ability to use techniques and skills needed for reducing stress and depression.  Initial Review & Psychosocial Screening:     Initial Psych Review & Screening - 03/18/15 0826    Family Dynamics   Good Support System? Yes   Barriers   Psychosocial barriers to participate in program There are no identifiable barriers or psychosocial needs.;The patient should benefit from training in stress management and relaxation.   Screening Interventions   Interventions Encouraged to exercise;Program counselor consult      Quality of Life Scores:     Quality of Life - 03/18/15 1628    Quality of Life Scores   Health/Function Pre 22.8 %   Socioeconomic Pre 30 %   Psych/Spiritual Pre 29.14 %   Family Pre 30 %   GLOBAL Pre 26.44 %      PHQ-9:     Recent Review Flowsheet Data    Depression screen [facility] 2/9 03/18/2015   Decreased Interest 0   Down, Depressed, Hopeless 0   PHQ - 2 Score 0   Altered sleeping 0   Tired, decreased energy 1   Change in appetite 0   Feeling bad or failure about yourself  0   Trouble concentrating 0   Moving slowly or fidgety/restless 0   Suicidal thoughts 0   PHQ-9 Score 1   Difficult doing work/chores  Somewhat difficult      Psychosocial Evaluation and Intervention:     Psychosocial Evaluation - 04/01/15 0943    Psychosocial Evaluation & Interventions   Interventions Encouraged to exercise with the program and follow exercise prescription   Comments Counselor met with Mr. Mey for a psychosocial evaluation.  He is an almost 80 year old (in 2 weeks) who has a history of heart attacks, A Fib and Congestive Heart Failure.  He has a spouse of 94 years and reports he is sleeping and eating well at this time.   He denies a history of depression or anxiety or current symptoms.  He reports his mood is typically positive although he gets a little concerned at times about his health issues and some property sale transaction that are currently going on in his life.  His goal for this program is to walk without "wearing out" and increase his stamina and strength.  He plans to participate in a follow up program after graduating from this program.     Continued Psychosocial Services Needed --  Mr. Castrillon wil benefit from the psychoeducational components of this program, especially on stress management and relaxation.        Psychosocial Re-Evaluation:   Vocational Rehabilitation: Provide vocational rehab assistance to qualifying candidates.   Vocational Rehab Evaluation & Intervention:     Vocational Rehab - 03/18/15 0817    Initial Vocational Rehab Evaluation & Intervention   Assessment shows need for Vocational Rehabilitation No      Education: Education Goals: Education classes will be provided on a weekly basis, covering required topics. Participant will state understanding/return demonstration of topics presented.  Learning Barriers/Preferences:     Learning Barriers/Preferences - 03/18/15 0817    Learning Barriers/Preferences   Learning Barriers None   Learning Preferences None      Education Topics: General Nutrition Guidelines/Fats and  Fiber: -Group instruction  provided by verbal, written material, models and posters to present the general guidelines for heart healthy nutrition. Gives an explanation and review of dietary fats and fiber.   Controlling Sodium/Reading Food Labels: -Group verbal and written material supporting the discussion of sodium use in heart healthy nutrition. Review and explanation with models, verbal and written materials for utilization of the food label.   Exercise Physiology & Risk Factors: - Group verbal and written instruction with models to review the exercise physiology of the cardiovascular system and associated critical values. Details cardiovascular disease risk factors and the goals associated with each risk factor.   Aerobic Exercise & Resistance Training: - Gives group verbal and written discussion on the health impact of inactivity. On the components of aerobic and resistive training programs and the benefits of this training and how to safely progress through these programs.   Flexibility, Balance, General Exercise Guidelines: - Provides group verbal and written instruction on the benefits of flexibility and balance training programs. Provides general exercise guidelines with specific guidelines to those with heart or lung disease. Demonstration and skill practice provided.   Stress Management: - Provides group verbal and written instruction about the health risks of elevated stress, cause of high stress, and healthy ways to reduce stress.   Depression: - Provides group verbal and written instruction on the correlation between heart/lung disease and depressed mood, treatment options, and the stigmas associated with seeking treatment.   Anatomy & Physiology of the Heart: - Group verbal and written instruction and models provide basic cardiac anatomy and physiology, with the coronary electrical and arterial systems. Review of: AMI, Angina, Valve disease, Heart Failure, Cardiac Arrhythmia, Pacemakers, and the  ICD.   Cardiac Procedures: - Group verbal and written instruction and models to describe the testing methods done to diagnose heart disease. Reviews the outcomes of the test results. Describes the treatment choices: Medical Management, Angioplasty, or Coronary Bypass Surgery.   Cardiac Medications: - Group verbal and written instruction to review commonly prescribed medications for heart disease. Reviews the medication, class of the drug, and side effects. Includes the steps to properly store meds and maintain the prescription regimen.   Go Sex-Intimacy & Heart Disease, Get SMART - Goal Setting: - Group verbal and written instruction through game format to discuss heart disease and the return to sexual intimacy. Provides group verbal and written material to discuss and apply goal setting through the application of the S.M.A.R.T. Method.   Other Matters of the Heart: - Provides group verbal, written materials and models to describe Heart Failure, Angina, Valve Disease, and Diabetes in the realm of heart disease. Includes description of the disease process and treatment options available to the cardiac patient.   Exercise & Equipment Safety: - Individual verbal instruction and demonstration of equipment use and safety with use of the equipment.          Cardiac Rehab from 03/18/2015 in [facility] Cardiac Rehab   Date  03/18/15   Educator  SB   Instruction Review Code  2- meets goals/outcomes      Infection Prevention: - Provides verbal and written material to individual with discussion of infection control including proper hand washing and proper equipment cleaning during exercise session.      Cardiac Rehab from 03/18/2015 in [facility] Cardiac Rehab   Date  03/18/15   Educator  SB   Instruction Review Code  2- meets goals/outcomes      Falls Prevention: - Provides verbal and written  material to individual with discussion of falls prevention and safety.      Cardiac Rehab from 03/18/2015 in  [facility] Cardiac Rehab   Date  03/18/15   Educator  Sb   Instruction Review Code  2- meets goals/outcomes      Diabetes: - Individual verbal and written instruction to review signs/symptoms of diabetes, desired ranges of glucose level fasting, after meals and with exercise. Advice that pre and post exercise glucose checks will be done for 3 sessions at entry of program.    Knowledge Questionnaire Score:     Knowledge Questionnaire Score - 03/18/15 1625    Knowledge Questionnaire Score   Pre Score 23/28      Personal Goals and Risk Factors at Admission:     Personal Goals and Risk Factors at Admission - 03/18/15 0824    Personal Goals and Risk Factors on Admission    Weight Management Yes   Intervention Learn and follow the exercise and diet guidelines while in the program. Utilize the nutrition and education classes to help gain knowledge of the diet and exercise expectations in the program   Increase Aerobic Exercise and Physical Activity Yes;Sedentary   Intervention While in program, learn and follow the exercise prescription taught. Start at a low level workload and increase workload after able to maintain previous level for 30 minutes. Increase time before increasing intensity.   Intervention Provide exercise education and an individualized exercise prescription that will provide continued progressive overload as per policy without signs/symptoms of physical distress.   Diabetes No   Hypertension No   Lipids Yes   Goal Cholesterol controlled with medications as prescribed, with individualized exercise RX and with personalized nutrition plan. Value goals: LDL < 63m, HDL > 472m Participant states understanding of desired cholesterol values and following prescriptions.   Intervention Provide nutrition & aerobic exercise along with prescribed medications to achieve LDL <7016mHDL >62m23m Stress Yes   Goal To meet with psychosocial counselor for stress and relaxation information  and guidance. To state understanding of performing relaxation techniques and or identifying personal stressors.   Intervention Provide education on types of stress, identifiying stressors, and ways to cope with stress. Provide demonstration and active practice of relaxation techniques.      Personal Goals and Risk Factors Review:      Goals and Risk Factor Review      04/17/15 1253 05/07/15 1414         Increase Aerobic Exercise and Physical Activity   Goals Progress/Improvement seen  No No      Comments I called EddiPrincess Bruins he said "the exercise is about to kill me". I exercised on Thursday and needed Tuesday to get ready. Now I feel the [place] is taking my strength. MD increased Torsemide but he still have feet swelling,. EddiUra Hausenorts that he is going to the Nephologist tomorrow (Kidney doctor). He will check with his MD. I told him that they can bring him down in wheelchair but he really doesn't want to do. Has been out of Cardiac Rehab a while.          Personal Goals Discharge (Final Personal Goals and Risk Factors Review):      Goals and Risk Factor Review - 05/07/15 1414    Increase Aerobic Exercise and Physical Activity   Goals Progress/Improvement seen  No   Comments Has been out of Cardiac Rehab a while.        Comments: Has been out  of Cardiac Rehab a while.

## 2015-05-15 ENCOUNTER — Inpatient Hospital Stay
Admission: EM | Admit: 2015-05-15 | Discharge: 2015-05-19 | DRG: 291 | Disposition: A | Discharge status: Home / Self Care | Payer: Medicare Other | Attending: Internal Medicine | Admitting: Internal Medicine

## 2015-05-15 ENCOUNTER — Emergency Department: Payer: Medicare Other

## 2015-05-15 ENCOUNTER — Encounter: Payer: Self-pay | Admitting: Emergency Medicine

## 2015-05-15 DIAGNOSIS — K5900 Constipation, unspecified: Secondary | ICD-10-CM | POA: Diagnosis present

## 2015-05-15 DIAGNOSIS — K259 Gastric ulcer, unspecified as acute or chronic, without hemorrhage or perforation: Secondary | ICD-10-CM | POA: Diagnosis present

## 2015-05-15 DIAGNOSIS — Z79899 Other long term (current) drug therapy: Secondary | ICD-10-CM

## 2015-05-15 DIAGNOSIS — Z823 Family history of stroke: Secondary | ICD-10-CM | POA: Diagnosis not present

## 2015-05-15 DIAGNOSIS — E1122 Type 2 diabetes mellitus with diabetic chronic kidney disease: Secondary | ICD-10-CM | POA: Diagnosis present

## 2015-05-15 DIAGNOSIS — N183 Chronic kidney disease, stage 3 unspecified: Secondary | ICD-10-CM

## 2015-05-15 DIAGNOSIS — N189 Chronic kidney disease, unspecified: Secondary | ICD-10-CM | POA: Diagnosis present

## 2015-05-15 DIAGNOSIS — E785 Hyperlipidemia, unspecified: Secondary | ICD-10-CM | POA: Diagnosis present

## 2015-05-15 DIAGNOSIS — I130 Hypertensive heart and chronic kidney disease with heart failure and stage 1 through stage 4 chronic kidney disease, or unspecified chronic kidney disease: Principal | ICD-10-CM | POA: Diagnosis present

## 2015-05-15 DIAGNOSIS — N401 Enlarged prostate with lower urinary tract symptoms: Secondary | ICD-10-CM | POA: Diagnosis present

## 2015-05-15 DIAGNOSIS — R338 Other retention of urine: Secondary | ICD-10-CM | POA: Diagnosis present

## 2015-05-15 DIAGNOSIS — Z951 Presence of aortocoronary bypass graft: Secondary | ICD-10-CM | POA: Diagnosis not present

## 2015-05-15 DIAGNOSIS — I2510 Atherosclerotic heart disease of native coronary artery without angina pectoris: Secondary | ICD-10-CM | POA: Diagnosis present

## 2015-05-15 DIAGNOSIS — K210 Gastro-esophageal reflux disease with esophagitis: Secondary | ICD-10-CM | POA: Diagnosis present

## 2015-05-15 DIAGNOSIS — Z8249 Family history of ischemic heart disease and other diseases of the circulatory system: Secondary | ICD-10-CM

## 2015-05-15 DIAGNOSIS — I959 Hypotension, unspecified: Secondary | ICD-10-CM | POA: Diagnosis present

## 2015-05-15 DIAGNOSIS — E039 Hypothyroidism, unspecified: Secondary | ICD-10-CM | POA: Diagnosis present

## 2015-05-15 DIAGNOSIS — D649 Anemia, unspecified: Secondary | ICD-10-CM

## 2015-05-15 DIAGNOSIS — K624 Stenosis of anus and rectum: Secondary | ICD-10-CM | POA: Diagnosis present

## 2015-05-15 DIAGNOSIS — G4730 Sleep apnea, unspecified: Secondary | ICD-10-CM | POA: Diagnosis present

## 2015-05-15 DIAGNOSIS — D125 Benign neoplasm of sigmoid colon: Secondary | ICD-10-CM | POA: Diagnosis present

## 2015-05-15 DIAGNOSIS — D123 Benign neoplasm of transverse colon: Secondary | ICD-10-CM | POA: Diagnosis present

## 2015-05-15 DIAGNOSIS — D509 Iron deficiency anemia, unspecified: Secondary | ICD-10-CM | POA: Diagnosis present

## 2015-05-15 DIAGNOSIS — E559 Vitamin D deficiency, unspecified: Secondary | ICD-10-CM | POA: Diagnosis present

## 2015-05-15 DIAGNOSIS — E871 Hypo-osmolality and hyponatremia: Secondary | ICD-10-CM

## 2015-05-15 DIAGNOSIS — F17290 Nicotine dependence, other tobacco product, uncomplicated: Secondary | ICD-10-CM | POA: Diagnosis present

## 2015-05-15 DIAGNOSIS — I482 Chronic atrial fibrillation, unspecified: Secondary | ICD-10-CM | POA: Diagnosis present

## 2015-05-15 DIAGNOSIS — I252 Old myocardial infarction: Secondary | ICD-10-CM | POA: Diagnosis not present

## 2015-05-15 DIAGNOSIS — R1310 Dysphagia, unspecified: Secondary | ICD-10-CM | POA: Diagnosis present

## 2015-05-15 DIAGNOSIS — N400 Enlarged prostate without lower urinary tract symptoms: Secondary | ICD-10-CM | POA: Diagnosis present

## 2015-05-15 DIAGNOSIS — I10 Essential (primary) hypertension: Secondary | ICD-10-CM | POA: Diagnosis present

## 2015-05-15 DIAGNOSIS — K644 Residual hemorrhoidal skin tags: Secondary | ICD-10-CM | POA: Diagnosis present

## 2015-05-15 DIAGNOSIS — Z803 Family history of malignant neoplasm of breast: Secondary | ICD-10-CM

## 2015-05-15 DIAGNOSIS — I5023 Acute on chronic systolic (congestive) heart failure: Secondary | ICD-10-CM

## 2015-05-15 HISTORY — DX: Chronic kidney disease, stage 3 (moderate): N18.3

## 2015-05-15 HISTORY — DX: Benign prostatic hyperplasia without lower urinary tract symptoms: N40.0

## 2015-05-15 HISTORY — DX: Chronic kidney disease, stage 3 unspecified: N18.30

## 2015-05-15 HISTORY — DX: Hyperlipidemia, unspecified: E78.5

## 2015-05-15 HISTORY — DX: Chronic systolic (congestive) heart failure: I50.22

## 2015-05-15 HISTORY — DX: Gastro-esophageal reflux disease without esophagitis: K21.9

## 2015-05-15 HISTORY — DX: Unspecified atrial fibrillation: I48.91

## 2015-05-15 LAB — COMPREHENSIVE METABOLIC PANEL
ALBUMIN: 3.2 g/dL — AB (ref 3.5–5.0)
ALT: 24 U/L (ref 17–63)
AST: 39 U/L (ref 15–41)
Alkaline Phosphatase: 148 U/L — ABNORMAL HIGH (ref 38–126)
Anion gap: 12 (ref 5–15)
BILIRUBIN TOTAL: 1.5 mg/dL — AB (ref 0.3–1.2)
BUN: 55 mg/dL — AB (ref 6–20)
CHLORIDE: 90 mmol/L — AB (ref 101–111)
CO2: 24 mmol/L (ref 22–32)
CREATININE: 2.2 mg/dL — AB (ref 0.61–1.24)
Calcium: 8.4 mg/dL — ABNORMAL LOW (ref 8.9–10.3)
GFR calc Af Amer: 31 mL/min — ABNORMAL LOW (ref >60)
GFR, EST NON AFRICAN AMERICAN: 27 mL/min — AB (ref >60)
GLUCOSE: 163 mg/dL — AB (ref 65–99)
POTASSIUM: 3.7 mmol/L (ref 3.5–5.1)
Sodium: 126 mmol/L — ABNORMAL LOW (ref 135–145)
TOTAL PROTEIN: 6.6 g/dL (ref 6.5–8.1)

## 2015-05-15 LAB — PREPARE RBC (CROSSMATCH)

## 2015-05-15 LAB — URINALYSIS COMPLETE WITH MICROSCOPIC (ARMC ONLY)
BACTERIA UA: NONE SEEN
BILIRUBIN URINE: NEGATIVE
GLUCOSE, UA: NEGATIVE mg/dL
Hgb urine dipstick: NEGATIVE
Ketones, ur: NEGATIVE mg/dL
Leukocytes, UA: NEGATIVE
Nitrite: NEGATIVE
Protein, ur: NEGATIVE mg/dL
SQUAMOUS EPITHELIAL / LPF: NONE SEEN
Specific Gravity, Urine: 1.009 (ref 1.005–1.030)
pH: 6 (ref 5.0–8.0)

## 2015-05-15 LAB — CBC
HEMATOCRIT: 25.8 % — AB (ref 40.0–52.0)
Hemoglobin: 7.9 g/dL — ABNORMAL LOW (ref 13.0–18.0)
MCH: 24.2 pg — ABNORMAL LOW (ref 26.0–34.0)
MCHC: 30.5 g/dL — AB (ref 32.0–36.0)
MCV: 79.3 fL — AB (ref 80.0–100.0)
PLATELETS: 133 10*3/uL — AB (ref 150–440)
RBC: 3.25 MIL/uL — ABNORMAL LOW (ref 4.40–5.90)
RDW: 23.1 % — AB (ref 11.5–14.5)
WBC: 7 10*3/uL (ref 3.8–10.6)

## 2015-05-15 LAB — LIPASE, BLOOD: Lipase: 52 U/L — ABNORMAL HIGH (ref 11–51)

## 2015-05-15 LAB — ABO/RH: ABO/RH(D): O POS

## 2015-05-15 LAB — BRAIN NATRIURETIC PEPTIDE: B NATRIURETIC PEPTIDE 5: 1537 pg/mL — AB (ref 0.0–100.0)

## 2015-05-15 LAB — TROPONIN I
TROPONIN I: 0.06 ng/mL — AB (ref <0.031)
Troponin I: 0.06 ng/mL — ABNORMAL HIGH (ref <0.031)

## 2015-05-15 MED ORDER — SODIUM CHLORIDE 0.9 % IV BOLUS (SEPSIS)
500.0000 mL | Freq: Once | INTRAVENOUS | Status: AC
  Administered 2015-05-15: 500 mL via INTRAVENOUS

## 2015-05-15 MED ORDER — HEPARIN SODIUM (PORCINE) 5000 UNIT/ML IJ SOLN: 5000.0000 [IU] | Freq: Three times a day (TID) | INTRAMUSCULAR | Status: AC

## 2015-05-15 MED ORDER — SODIUM CHLORIDE 0.9 % IJ SOLN
3.0000 mL | Freq: Two times a day (BID) | INTRAMUSCULAR | Status: DC
Start: 2015-05-15 — End: 2015-05-19
  Administered 2015-05-15 – 2015-05-18 (×7): 3 mL via INTRAVENOUS

## 2015-05-15 MED ORDER — ONDANSETRON HCL 4 MG/2ML IJ SOLN: 4.0000 mg | Freq: Four times a day (QID) | INTRAMUSCULAR | Status: DC | PRN

## 2015-05-15 MED ORDER — ATORVASTATIN CALCIUM 20 MG PO TABS
80.0000 mg | ORAL_TABLET | Freq: Every day | ORAL | Status: DC
  Administered 2015-05-16: 20 mg via ORAL
  Filled 2015-05-15: qty 4

## 2015-05-15 MED ORDER — ACETAMINOPHEN 325 MG PO TABS
650.0000 mg | ORAL_TABLET | Freq: Once | ORAL | Status: AC
  Administered 2015-05-15: 650 mg via ORAL

## 2015-05-15 MED ORDER — LEVOTHYROXINE SODIUM 112 MCG PO TABS
112.0000 ug | ORAL_TABLET | Freq: Every day | ORAL | Status: DC
  Administered 2015-05-16: 112 ug via ORAL
  Filled 2015-05-15: qty 1

## 2015-05-15 MED ORDER — AMIODARONE HCL 200 MG PO TABS
200.0000 mg | ORAL_TABLET | Freq: Every day | ORAL | Status: DC
  Administered 2015-05-16 – 2015-05-18 (×3): 200 mg via ORAL
  Filled 2015-05-15 (×3): qty 1

## 2015-05-15 MED ORDER — SODIUM CHLORIDE 0.9 % IV SOLN: Freq: Once | INTRAVENOUS | Status: DC

## 2015-05-15 MED ORDER — SORBITOL 70 % SOLN
960.0000 mL | TOPICAL_OIL | Freq: Once | ORAL | Status: AC
  Administered 2015-05-15: 960 mL via RECTAL
  Filled 2015-05-15 (×4): qty 240

## 2015-05-15 MED ORDER — ASPIRIN 81 MG PO CHEW
324.0000 mg | CHEWABLE_TABLET | Freq: Once | ORAL | Status: AC
  Administered 2015-05-15: 243 mg via ORAL
  Filled 2015-05-15: qty 4

## 2015-05-15 MED ORDER — FUROSEMIDE 10 MG/ML IJ SOLN: 20.0000 mg | Freq: Once | INTRAMUSCULAR | Status: DC

## 2015-05-15 MED ORDER — SPIRONOLACTONE 25 MG PO TABS
25.0000 mg | ORAL_TABLET | Freq: Every day | ORAL | Status: DC
  Administered 2015-05-15: 25 mg via ORAL
  Filled 2015-05-15: qty 1

## 2015-05-15 MED ORDER — CARVEDILOL 3.125 MG PO TABS
3.1250 mg | ORAL_TABLET | Freq: Two times a day (BID) | ORAL | Status: DC
  Administered 2015-05-18: 3.125 mg via ORAL
  Filled 2015-05-15 (×2): qty 1

## 2015-05-15 MED ORDER — IOHEXOL 240 MG/ML SOLN
50.0000 mL | INTRAMUSCULAR | Status: AC
Start: 2015-05-15 — End: 2015-05-15
  Administered 2015-05-15: 50 mL via ORAL

## 2015-05-15 MED ORDER — PANTOPRAZOLE SODIUM 40 MG PO TBEC
40.0000 mg | DELAYED_RELEASE_TABLET | Freq: Two times a day (BID) | ORAL | Status: DC
  Administered 2015-05-15 – 2015-05-18 (×7): 40 mg via ORAL
  Filled 2015-05-15 (×7): qty 1

## 2015-05-15 MED ORDER — SODIUM CHLORIDE 0.9 % IV SOLN
Freq: Once | INTRAVENOUS | Status: AC
Start: 2015-05-15 — End: 2015-05-15
  Administered 2015-05-15: 20:00:00 via INTRAVENOUS

## 2015-05-15 MED ORDER — ONDANSETRON HCL 4 MG PO TABS: 4.0000 mg | ORAL_TABLET | Freq: Four times a day (QID) | ORAL | Status: DC | PRN

## 2015-05-15 MED ORDER — DIPHENHYDRAMINE HCL 25 MG PO CAPS
25.0000 mg | ORAL_CAPSULE | Freq: Once | ORAL | Status: AC
  Administered 2015-05-15: 25 mg via ORAL
  Filled 2015-05-15: qty 1

## 2015-05-15 MED ORDER — SODIUM CHLORIDE 0.9 % IV SOLN
Freq: Once | INTRAVENOUS | Status: AC
  Administered 2015-05-15: 16:00:00 via INTRAVENOUS

## 2015-05-15 MED ORDER — ACETAMINOPHEN 650 MG RE SUPP: 650.0000 mg | Freq: Four times a day (QID) | RECTAL | Status: DC | PRN

## 2015-05-15 MED ORDER — ACETAMINOPHEN 325 MG PO TABS
650.0000 mg | ORAL_TABLET | Freq: Four times a day (QID) | ORAL | Status: DC | PRN
  Filled 2015-05-15: qty 2

## 2015-05-15 NOTE — ED Notes (Signed)
Pt up to the toliet, pt states he feels very weak and when he stood up to go back to the bed he felt dizzy, initial pressure on return to bed 76/41, then 84/51. Dr. Edd Fabian notified of the same.

## 2015-05-15 NOTE — ED Provider Notes (Signed)
[facility] Emergency Department Provider Note     Time seen: ----------------------------------------- 2:41 PM on 05/15/2015 -----------------------------------------    I have reviewed the triage vital signs and the nursing notes.   HISTORY  Chief Complaint Abdominal Pain; Constipation; and Urinary Retention    HPI Jeffrey Gallegos is a 80 y.o. male who presents ER for constipation for 1-2 weeks. His also had decreased urinary output and bilateral lower extremity edema. Patient reports an 8 pound weight gain over the last week, also states he has a history of anemia, he was post have an endoscopy and colonoscopy tomorrow. Patient denies any fevers or chills, denies chest pain. He does have shortness of breath. Nothing makes his Symptoms better or worse. He does note that he has had to take nitroglycerin more frequently than normal recently.   Past Medical History  Diagnosis Date  . History of MI (myocardial infarction) 2011  . History of mumps     Childhood  . Coronary artery disease   . Myocardial infarction ([place])   . Hypertension   . Sleep apnea   . Thyroid disease     synthroid  . Anemia   . Vitamin D deficiency     Patient Active Problem List   Diagnosis Date Noted  . Chronic kidney disease 02/21/2015  . Bradycardia 01/16/2015  . A-fib ([place]) 10/26/2014  . Basal cell carcinoma of skin 10/26/2014  . BPH (benign prostatic hyperplasia) 10/26/2014  . Arteriosclerosis of coronary artery 10/26/2014  . ED (erectile dysfunction) of organic origin 10/26/2014  . Hyperlipidemia 10/26/2014  . BP (high blood pressure) 10/26/2014  . Adult hypothyroidism 10/26/2014  . Diabetes ([place]) 10/26/2014  . Polypharmacy 10/26/2014  . Chronic systolic heart failure ([place]) 09/18/2014  . Cardiomyopathy, ischemic 09/18/2014  . Partial anomalous pulmonary venous connection 09/18/2014  . Disorder of right ventricle of heart 09/18/2014  . Atrial fibrillation,  chronic ([place]) 09/18/2014  . Fecal occult blood test positive 08/28/2014  . 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 Benign essential HTN 10/20/2013  . CAD in native artery 10/20/2013  . Severe tricuspid regurgitation 01/24/2012    Past Surgical History  Procedure Laterality Date  . Coronary artery bypass graft  11/27/2009    [facility] at [facility]  . Lasik Bilateral 2002  . Cyst excision Right     Foot  . Leg surgery      Right leg fracture  . Cardiac catheterization    . Electrophysiologic study N/A 01/13/2015    Procedure: CARDIOVERSION;  Surgeon: Teodoro Spray, MD;  Location: [facility] ORS;  Service: Cardiovascular;  Laterality: N/A;    Allergies Review of patient's allergies indicates no known allergies.  Social History Social History  Substance Use Topics  . Smoking status: Former Smoker    Types: Cigarettes    Quit date: 07/26/1968  . Smokeless tobacco: Current User    Types: Chew     Comment: Quit smoking in the 1970s  . Alcohol Use: 0.0 oz/week    0 Standard drinks or equivalent per week     Comment: Drinks Scotch    Review of Systems Constitutional: Negative for fever. Eyes: Negative for visual changes. ENT: Negative for sore throat. Cardiovascular: Negative for chest pain. Respiratory: Positive for shortness of breath Gastrointestinal: Negative for abdominal pain, vomiting and diarrhea. Positive for constipation Genitourinary: Positive for decreased urine output Musculoskeletal: Negative for back pain. Positive for edema Skin: Negative for rash. Neurological: Negative for headaches, focal weakness or  numbness.  10-point ROS otherwise negative.  ____________________________________________   PHYSICAL EXAM:  VITAL SIGNS: ED Triage Vitals  Enc Vitals Group     BP 05/15/15 1409 102/60 mmHg     Pulse Rate 05/15/15 1409 60     Resp 05/15/15 1409 16     Temp 05/15/15 1409 97.5 F (36.4 C)     Temp src --      SpO2 05/15/15 1409  96 %     Weight 05/15/15 1404 148 lb (67.132 kg)     Height 05/15/15 1404 5\' 8"  (1.727 m)     Head Cir --      Peak Flow --      Pain Score 05/15/15 1410 0     Pain Loc --      Pain Edu? --      Excl. in [place]? --    EKG: Ventricular pacemaker with a rate of 60 bpm, no evidence of acute infarction.  Constitutional: Alert and oriented. Mild distress. Eyes: Conjunctivae are normal. PERRL. Normal extraocular movements. ENT   Head: Normocephalic and atraumatic.   Nose: No congestion/rhinnorhea.   Mouth/Throat: Mucous membranes are moist.   Neck: No stridor. Cardiovascular: Normal rate, regular rhythm. 2/6 systolic murmur Respiratory: Normal respiratory effort without tachypnea nor retractions. Breath sounds are clear and equal bilaterally. No wheezes/rales/rhonchi. Gastrointestinal: Soft and nontender. No distention. No abdominal bruits.  Rectal: Stool is palpated in the rectal vault. There appears to be a rectal stricture on palpation that would only allow finger width of passage Musculoskeletal: Nontender with normal range of motion in all extremities. Bilateral lower extremity edema, right greater than left Neurologic:  Normal speech and language. No gross focal neurologic deficits are appreciated. Speech is normal. No gait instability. Skin:  Skin is warm, dry and intact. Pallor is noted Psychiatric: Mood and affect are normal. Speech and behavior are normal. Patient exhibits appropriate insight and judgment.  ____________________________________________  ED COURSE:  Pertinent labs & imaging results that were available during my care of the patient were reviewed by me and considered in my medical decision making (see chart for details). Patient is in no acute distress, does appear to be constipated. When he basic labs and urine likely imaging and reevaluation ____________________________________________    LABS (pertinent positives/negatives)  Labs Reviewed  CBC -  Abnormal; Notable for the following:    RBC 3.25 (*)    Hemoglobin 7.9 (*)    HCT 25.8 (*)    MCV 79.3 (*)    MCH 24.2 (*)    MCHC 30.5 (*)    RDW 23.1 (*)    Platelets 133 (*)    All other components within normal limits  LIPASE, BLOOD  COMPREHENSIVE METABOLIC PANEL  URINALYSIS COMPLETEWITH MICROSCOPIC ([facility] ONLY)  TROPONIN I  BRAIN NATRIURETIC PEPTIDE    RADIOLOGY Images were viewed by me  Acute abdominal series reveals constipation  ____________________________________________  FINAL ASSESSMENT AND PLAN  Constipation, dyspnea, urinary hesitancy, edema, hyponatremia, renal insufficiency, anemia  Plan: Patient with labs and imaging as dictated above. Patient appears to have significant symptomatically anemia of unclear etiology. Suspect intermittent GI bleed. I will scan the patient that he will likely need admission and would benefit from blood transfusion. I have started to gently hydrate him.   Earleen Newport, MD   Earleen Newport, MD 05/15/15 [phone]

## 2015-05-15 NOTE — ED Notes (Signed)
Admitting MD at bedside.

## 2015-05-15 NOTE — H&P (Signed)
[place] [facility] [place] NAME: Jeffrey Gallegos    MR#:  [id number]  DATE OF BIRTH:  18-Mar-1935  DATE OF ADMISSION:  05/15/2015  PRIMARY CARE PHYSICIAN: [name], MD   REQUESTING/REFERRING PHYSICIAN: Edd Fabian, MD  CHIEF COMPLAINT:   Chief Complaint  Patient presents with  . Abdominal Pain  . Constipation  . Urinary Retention    HISTORY OF PRESENT ILLNESS:  Jeffrey Gallegos  is a 80 y.o. male who presents with severe constipation. Patient states that he was scheduled to have colonoscopy tomorrow as part of his anemia workup. However, he was not able to complete appropriate bowel prep due to his severe constipation. Patient has battled with constipation for some time now, and even self administers enemas at home. He gave himself a mineral oil enema yesterday, but did not succeed in producing significant bowel movement. He is also having some increase in shortness of breath, as well as some weight gain and increased swelling. He has a history of heart failure. In the ED he was found to have an elevated BNP, mildly elevated troponin is 0.06, hyponatremia at 126, with a baseline in the low 130s. Hospitalists were called for admission for the same.  PAST MEDICAL HISTORY:   Past Medical History  Diagnosis Date  . History of MI (myocardial infarction) 2011  . History of mumps     Childhood  . Coronary artery disease   . Hypertension   . Sleep apnea   . Thyroid disease     synthroid  . Anemia   . Vitamin D deficiency   . Chronic systolic CHF (congestive heart failure) ([place])   . HLD (hyperlipidemia)   . GERD (gastroesophageal reflux disease)   . A-fib ([place])     on eliquis  . BPH (benign prostatic hyperplasia)   . CKD (chronic kidney disease), stage III     PAST SURGICAL HISTORY:   Past Surgical History  Procedure Laterality Date  . Coronary artery bypass graft  11/27/2009    4V at [facility]  . Lasik Bilateral 2002  . Cyst excision  Right     Foot  . Leg surgery      Right leg fracture  . Cardiac catheterization    . Electrophysiologic study N/A 01/13/2015    Procedure: CARDIOVERSION;  Surgeon: Teodoro Spray, MD;  Location: [facility] ORS;  Service: Cardiovascular;  Laterality: N/A;    SOCIAL HISTORY:   Social History  Substance Use Topics  . Smoking status: Former Smoker    Types: Cigarettes    Quit date: 07/26/1968  . Smokeless tobacco: Current User    Types: Chew     Comment: Quit smoking in the 1970s  . Alcohol Use: 0.0 oz/week    0 Standard drinks or equivalent per week     Comment: Drinks Scotch    FAMILY HISTORY:   Family History  Problem Relation Age of Onset  . Cancer Mother     Gaspar Cola of death 8  . Heart attack Father     age of death 60  . Heart failure Sister   . Seizures Sister   . Ulcers Sister   . Heart attack Maternal Uncle     age of death 64  . Heart attack Paternal Uncle     age of death 44  . Stroke Sister     age of death 55  . Heart attack Paternal Uncle     age of death 43  DRUG ALLERGIES:  No Known Allergies  MEDICATIONS AT HOME:   Prior to Admission medications   Medication Sig Start Date End Date Taking? Authorizing Provider  Acetaminophen (TYLENOL PO) Take by mouth as needed.   Yes Historical Provider, MD  amiodarone (PACERONE) 200 MG tablet Take 200 mg by mouth daily.    Yes Historical Provider, MD  atorvastatin (LIPITOR) 80 MG tablet TAKE ONE (1) TABLET BY MOUTH EVERY DAY 04/21/15  Yes Birdie Sons, MD  carvedilol (COREG) 3.125 MG tablet Take 1 tablet by mouth 2 (two) times daily. 02/05/15  Yes Historical Provider, MD  ELIQUIS 5 MG TABS tablet Take 5 mg by mouth 2 (two) times daily.  02/25/15  Yes Historical Provider, MD  levothyroxine (SYNTHROID, LEVOTHROID) 112 MCG tablet Take 1 tablet (112 mcg total) by mouth daily before breakfast. 04/04/15  Yes Birdie Sons, MD  naproxen sodium (ANAPROX) 220 MG tablet Take 220 mg by mouth 2 (two) times daily with a  meal. PRN for pain   Yes Historical Provider, MD  nitroGLYCERIN (NITROSTAT) 0.4 MG SL tablet Place 0.4 mg under the tongue every 5 (five) minutes as needed for chest pain.   Yes Historical Provider, MD  pantoprazole (PROTONIX) 40 MG tablet TAKE ONE (1) TABLET BY MOUTH EVERY DAY 02/17/15  Yes Birdie Sons, MD  Saw [place], Serenoa repens, 450 MG CAPS Take 3 tablets by mouth 2 (two) times daily. 03/24/11  Yes Historical Provider, MD  spironolactone (ALDACTONE) 25 MG tablet Take 25 mg by mouth daily.  02/24/15  Yes Historical Provider, MD  tadalafil (CIALIS) 5 MG tablet Take 1 tablet by mouth daily. 08/24/12  Yes Historical Provider, MD  torsemide (DEMADEX) 10 MG tablet Take by mouth. Takes 20 in the morning and 10 in the evening   Yes Historical Provider, MD    REVIEW OF SYSTEMS:  Review of Systems  Constitutional: Negative for fever, chills, weight loss and malaise/fatigue.  HENT: Negative for ear pain, hearing loss and tinnitus.   Eyes: Negative for blurred vision, double vision, pain and redness.  Respiratory: Positive for shortness of breath. Negative for cough and hemoptysis.   Cardiovascular: Positive for leg swelling. Negative for chest pain, palpitations and orthopnea.  Gastrointestinal: Positive for constipation. Negative for nausea, vomiting, abdominal pain and diarrhea.  Genitourinary: Negative for dysuria, frequency and hematuria.  Musculoskeletal: Negative for back pain, joint pain and neck pain.  Skin:       No acne, rash, or lesions  Neurological: Negative for dizziness, tremors, focal weakness and weakness.  Endo/Heme/Allergies: Negative for polydipsia. Does not bruise/bleed easily.  Psychiatric/Behavioral: Negative for depression. The patient is not nervous/anxious and does not have insomnia.      VITAL SIGNS:   Filed Vitals:   05/15/15 1600 05/15/15 1630 05/15/15 1711 05/15/15 1741  BP: 101/62 102/64 96/58 97/59   Pulse: 60 60 60 59  Temp:      Resp: 12 17 22 11    Height:      Weight:      SpO2: 95% 90% 96% 95%   Wt Readings from Last 3 Encounters:  05/15/15 67.132 kg (148 lb)  04/24/15 66.588 kg (146 lb 12.8 oz)  03/28/15 65.318 kg (144 lb)    PHYSICAL EXAMINATION:  Physical Exam  Vitals reviewed. Constitutional: He is oriented to person, place, and time. He appears well-developed and well-nourished. No distress.  HENT:  Head: Normocephalic and atraumatic.  Mouth/Throat: Oropharynx is clear and moist.  Eyes: Conjunctivae and EOM are normal.  Pupils are equal, round, and reactive to light. No scleral icterus.  Neck: Normal range of motion. Neck supple. No JVD present. No thyromegaly present.  Cardiovascular: Normal rate, regular rhythm and intact distal pulses.  Exam reveals no gallop and no friction rub.   No murmur heard. Respiratory: Effort normal and breath sounds normal. No respiratory distress. He has no wheezes. He has no rales.  GI: Soft. Bowel sounds are normal. He exhibits distension (mild). There is tenderness (Mild, diffuse).  Musculoskeletal: Normal range of motion. He exhibits no edema.  No arthritis, no gout  Lymphadenopathy:    He has no cervical adenopathy.  Neurological: He is alert and oriented to person, place, and time. No cranial nerve deficit.  No dysarthria, no aphasia  Skin: Skin is warm and dry. No rash noted. No erythema.  Psychiatric: He has a normal mood and affect. His behavior is normal. Judgment and thought content normal.    LABORATORY PANEL:   CBC  Recent Labs Lab 05/15/15 1438  WBC 7.0  HGB 7.9*  HCT 25.8*  PLT 133*   ------------------------------------------------------------------------------------------------------------------  Chemistries   Recent Labs Lab 05/15/15 1438  NA 126*  K 3.7  CL 90*  CO2 24  GLUCOSE 163*  BUN 55*  CREATININE 2.20*  CALCIUM 8.4*  AST 39  ALT 24  ALKPHOS 148*  BILITOT 1.5*    ------------------------------------------------------------------------------------------------------------------  Cardiac Enzymes  Recent Labs Lab 05/15/15 1438  TROPONINI 0.06*   ------------------------------------------------------------------------------------------------------------------  RADIOLOGY:  Ct Abdomen Pelvis Wo Contrast  05/15/2015  CLINICAL DATA:  Abdominal pain with constipation for the past 1-2 weeks. Decreased urinary output with increased fluid retention. EXAM: CT ABDOMEN AND PELVIS WITHOUT CONTRAST TECHNIQUE: Multidetector CT imaging of the abdomen and pelvis was performed following the standard protocol without IV contrast. COMPARISON:  None. FINDINGS: The lack of intravenous contrast limits the ability to evaluate solid abdominal organs. There is mild nodularity of the hepatic contour. There are several layering radiopaque gallstones within otherwise normal-appearing gallbladder. There is a trace amount of perihepatic ascites. Normal noncontrast appearance of the bilateral kidneys. No renal stones. No urinary obstruction or perinephric stranding. Normal noncontrast appearance of bilateral adrenal glands, pancreas and spleen. A small amount of fluid is noted within the left upper abdominal quadrant. Ingested enteric contrast extends to the level of the cecum. Large colonic stool burden without definite evidence of enteric obstruction. No pneumoperitoneum, pneumatosis or portal venous gas. Large amount of eccentric irregular calcified atherosclerotic plaque within a normal caliber abdominal aorta. No definitive bulky retroperitoneal, mesenteric, pelvic or inguinal lymphadenopathy on this noncontrast examination. There is a small amount of fluid seen within the pelvic cul-de-sac. Normal noncontrast appearance of the urinary bladder given degree distention. Limited visualization of the lower thorax demonstrates cardiomegaly. Pacer leads are seen within the right atrium,  ventricle and coronary sinus. There is diffuse decreased attenuation of the intra cardiac blood pool suggestive of anemia. Calcifications within the aortic valve leaflets. No pericardial effusion. Trace right-sided pleural effusion. Minimal dependent subpleural ground-glass atelectasis. No acute or aggressive osseous abnormalities. Mild-to-moderate multilevel lumbar spine DDD, worse at L4-L5 with disc space height loss, endplate irregularity and sclerosis. Mild diffuse body wall anasarca. Small bilateral mesenteric fat containing inguinal hernias. IMPRESSION: 1. Mild nodularity hepatic contour suggestive of mild cirrhotic change. 2. Small volume intra-abdominal ascites. 3. Cholelithiasis without definite evidence of cholecystitis on this noncontrast examination. 4. No evidence of nephrolithiasis or urinary obstruction. 5. Large colonic stool burden without evidence of enteric obstruction. 6. Cardiomegaly.  7. Suspected anemia. Electronically Signed   By: Sandi Mariscal M.D.   On: 05/15/2015 17:16   Dg Abd Acute W/chest  05/15/2015  CLINICAL DATA:  1-2 week history of constipation decreased urinary output with increased fluid retention and waking. EXAM: DG ABDOMEN ACUTE W/ 1V CHEST COMPARISON:  Chest x-ray 10/21/2009 FINDINGS: The upright chest x-ray demonstrates mild stable cardiac enlargement. There are surgical changes from triple bypass surgery and there are right atrial, right ventricular and coronary sinus pacer wires in good position without complicating features. The lungs are clear. Possible small right effusion. No pulmonary edema. Moderate stool throughout the colon and down into the rectum suggesting constipation. No findings for small bowel obstruction or free air. The soft tissue shadows are maintained the bony structures are intact. Mild scoliosis. IMPRESSION: Mild stable cardiac enlargement but no acute pulmonary findings. Moderate to large amount of stool throughout the colon consistent with  constipation. Electronically Signed   By: Marijo Sanes M.D.   On: 05/15/2015 15:29    EKG:   Orders placed or performed during the hospital encounter of 05/15/15  . ED EKG  . ED EKG  . EKG 12-Lead  . EKG 12-Lead    IMPRESSION AND PLAN:  Principal Problem:   Acute on chronic systolic CHF (congestive heart failure) (HCC) - we will administer IV diuretics here, especially around his blood transfusion, see below. We'll monitor for improvement in symptoms. Suspect mild bump in troponin is due to troponin leak in the setting of heart failure and renal disease. Will not get cardiology consult at this time, though if his enzymes rise any further or his heart failure symptoms are not improving with treatment, then we will need to do so. Active Problems:   Anemia - hemoglobin 7.8. His baseline on chart review seems to be a little higher than this. Given his heart failure symptoms, we'll transfuse him 1 unit of packed red blood cells. Discussed risks and benefits of this with patient, and he is agreeable to receiving the blood. GI consult in case they wish to perform his colonoscopy while patient is in the hospital.   Hyponatremia - likely due to his heart failure. We'll monitor closely while we treat his other problems. Suspect this will self correct as his other underlying issues are treated.   CAD in native artery - continue home meds, trend cardiac enzymes.   Constipation - we'll readminister enema here, though we will use a smog enema. GI recommendations appreciated.    Chronic kidney disease - avoid nephrotoxins, administer diuresis gently, monitor closely.   Benign essential HTN - continue home medications for this   Atrial fibrillation, chronic (HCC) - on amiodarone, currently in sinus rhythm. Also on eliquis, currently being held in preparation for his colonoscopy.   Adult hypothyroidism - continue home dose thyroid replacement   All the records are reviewed and case discussed with ED  provider. Management plans discussed with the patient and/or family.  DVT PROPHYLAXIS: SubQ heparin tonight then mechanical only in anticipation of possible procedure.   ADMISSION STATUS: Inpatient  CODE STATUS: full   TOTAL TIME TAKING CARE OF THIS PATIENT:  45 minutes.    Jeffrey Gallegos 05/15/2015, 6:08 PM  [facility]  Office  [phone]  CC: Primary care physician; [name], MD

## 2015-05-15 NOTE — Progress Notes (Signed)
Notified Dr. Lavetta Nielsen of BP 97/56. Hold pacerone, lasix, and coreg. Notified of inability to void. Foley ordered.  Skin verified with Apolonio Schneiders.

## 2015-05-15 NOTE — ED Provider Notes (Signed)
-----------------------------------------   3:44 PM on 05/15/2015 -----------------------------------------  Care assumed from Dr. Jimmye Norman at 3:30 PM. Briefly this is a 80 year old male presenting with Donald pain, decreased urinary output or shortness of breath. Labs are notable for hyponatremia as well as significant anemia with hemoglobin 7.9. Patient unable to have a rectal exam performed secondary to anal stricture. Plain films notable for constipation. Awaiting CT of the abdomen and pelvis after which anticipate admission.  ----------------------------------------- 5:29 PM on 05/15/2015 -----------------------------------------  CT abdomen and pelvis shows constipation, cholelithiasis without evidence of cholecystitis, his abdominal ascites. Bone and with elevated 0.06, aspirin ordered. Case was discussed with hospitalist for admission given his hyponatremia, anemia, troponin elevation. IV fluids infusing.  Joanne Gavel, MD 05/15/15 [phone]

## 2015-05-15 NOTE — ED Notes (Addendum)
Pt to ed with c/o constipation x 1-2 weeks, decreased urinary output and increased fluid retention, states weight gain of 8 lbs this week.  Pt appears pale at this time. appt tomorrow for endoscopy and colonoscopy.

## 2015-05-15 NOTE — ED Notes (Signed)
Patient transported to CT 

## 2015-05-16 ENCOUNTER — Encounter: Payer: Self-pay | Admitting: *Deleted

## 2015-05-16 ENCOUNTER — Encounter: Admission: RE | Payer: Self-pay | Source: Ambulatory Visit

## 2015-05-16 ENCOUNTER — Telehealth: Payer: Self-pay | Admitting: *Deleted

## 2015-05-16 ENCOUNTER — Ambulatory Visit: Admission: RE | Admit: 2015-05-16 | Payer: Medicare Other | Source: Ambulatory Visit | Admitting: Gastroenterology

## 2015-05-16 LAB — BASIC METABOLIC PANEL
ANION GAP: 9 (ref 5–15)
BUN: 52 mg/dL — ABNORMAL HIGH (ref 6–20)
CALCIUM: 8.2 mg/dL — AB (ref 8.9–10.3)
CO2: 24 mmol/L (ref 22–32)
CREATININE: 1.94 mg/dL — AB (ref 0.61–1.24)
Chloride: 99 mmol/L — ABNORMAL LOW (ref 101–111)
GFR, EST AFRICAN AMERICAN: 36 mL/min — AB (ref >60)
GFR, EST NON AFRICAN AMERICAN: 31 mL/min — AB (ref >60)
Glucose, Bld: 101 mg/dL — ABNORMAL HIGH (ref 65–99)
Potassium: 3.6 mmol/L (ref 3.5–5.1)
SODIUM: 132 mmol/L — AB (ref 135–145)

## 2015-05-16 LAB — CBC
HCT: 27.1 % — ABNORMAL LOW (ref 40.0–52.0)
HEMOGLOBIN: 8.7 g/dL — AB (ref 13.0–18.0)
MCH: 25.5 pg — AB (ref 26.0–34.0)
MCHC: 32 g/dL (ref 32.0–36.0)
MCV: 79.8 fL — ABNORMAL LOW (ref 80.0–100.0)
PLATELETS: 123 10*3/uL — AB (ref 150–440)
RBC: 3.39 MIL/uL — AB (ref 4.40–5.90)
RDW: 23 % — ABNORMAL HIGH (ref 11.5–14.5)
WBC: 10.3 10*3/uL (ref 3.8–10.6)

## 2015-05-16 LAB — TROPONIN I
TROPONIN I: 0.07 ng/mL — AB (ref <0.031)
Troponin I: 0.06 ng/mL — ABNORMAL HIGH (ref <0.031)

## 2015-05-16 SURGERY — COLONOSCOPY WITH PROPOFOL
Anesthesia: General

## 2015-05-16 MED ORDER — ATORVASTATIN CALCIUM 20 MG PO TABS
20.0000 mg | ORAL_TABLET | Freq: Every day | ORAL | Status: DC
  Administered 2015-05-17 – 2015-05-18 (×2): 20 mg via ORAL
  Filled 2015-05-16 (×2): qty 1

## 2015-05-16 MED ORDER — CHLORHEXIDINE GLUCONATE 0.12 % MT SOLN
15.0000 mL | Freq: Two times a day (BID) | OROMUCOSAL | Status: DC
  Administered 2015-05-16 – 2015-05-18 (×4): 15 mL via OROMUCOSAL
  Filled 2015-05-16 (×6): qty 15

## 2015-05-16 MED ORDER — FUROSEMIDE 10 MG/ML IJ SOLN
40.0000 mg | Freq: Two times a day (BID) | INTRAMUSCULAR | Status: DC
  Administered 2015-05-16 – 2015-05-17 (×3): 40 mg via INTRAVENOUS
  Filled 2015-05-16 (×3): qty 4

## 2015-05-16 MED ORDER — LACTULOSE 10 GM/15ML PO SOLN
30.0000 g | Freq: Two times a day (BID) | ORAL | Status: DC
  Administered 2015-05-16 – 2015-05-17 (×3): 30 g via ORAL
  Administered 2015-05-17: 40 g via ORAL
  Administered 2015-05-18 (×2): 30 g via ORAL
  Filled 2015-05-16 (×6): qty 60

## 2015-05-16 MED ORDER — LEVOTHYROXINE SODIUM 125 MCG PO TABS
125.0000 ug | ORAL_TABLET | Freq: Every day | ORAL | Status: DC
  Administered 2015-05-17 – 2015-05-18 (×2): 125 ug via ORAL
  Filled 2015-05-16 (×2): qty 1

## 2015-05-16 MED ORDER — CETYLPYRIDINIUM CHLORIDE 0.05 % MT LIQD
7.0000 mL | Freq: Two times a day (BID) | OROMUCOSAL | Status: DC
  Administered 2015-05-16: 7 mL via OROMUCOSAL

## 2015-05-16 NOTE — Progress Notes (Signed)
Pt stated that that he only took 20 mg of the Lipitor daily. Dr. Leslye Peer notified.. Orders received to change order from 80 mg to 20 mg.

## 2015-05-16 NOTE — Progress Notes (Signed)
GI Note:  Full consultt to follow.    Will plan to do EGD and colon at same time for IDA. Will allow workup of constipation and elev trop, volume issues first.  He can eat today.

## 2015-05-16 NOTE — Progress Notes (Signed)
Initial appointment was made at the [facility] on May 30, 2015 at 9:00am. Thank you.

## 2015-05-16 NOTE — Progress Notes (Signed)
Initial Nutrition Assessment       INTERVENTION:  Meals and snacks: Cater to pt preferences Nutrition diet education: Familiar with diet restrictions, no further education needed at this time per pt and wife   NUTRITION DIAGNOSIS:    (none at this time) related to   as evidenced by  .    GOAL:   Patient will meet greater than or equal to 90% of their needs   MONITOR:    (Energy intake, Digestive system)  REASON FOR ASSESSMENT:   Diagnosis    ASSESSMENT:      Pt admitted with severe constipation, CHF  Past Medical History  Diagnosis Date  . History of MI (myocardial infarction) 2011  . History of mumps     Childhood  . Coronary artery disease   . Hypertension   . Sleep apnea   . Thyroid disease     synthroid  . Anemia   . Vitamin D deficiency   . Chronic systolic CHF (congestive heart failure) ([place])   . HLD (hyperlipidemia)   . GERD (gastroesophageal reflux disease)   . A-fib ([place])     on eliquis  . BPH (benign prostatic hyperplasia)   . CKD (chronic kidney disease), stage III     Current Nutrition: NPO earlier today, eating popsicle during visit and waiting on tray  Food/Nutrition-Related History: normal intake prior to admission   Scheduled Medications:  . amiodarone  200 mg Oral Daily  . antiseptic oral rinse  7 mL Mouth Rinse q12n4p  . atorvastatin  80 mg Oral q1800  . carvedilol  3.125 mg Oral BID  . chlorhexidine  15 mL Mouth Rinse BID  . furosemide  20 mg Intravenous Once  . furosemide  20 mg Intravenous Once  . furosemide  40 mg Intravenous BID  . lactulose  30 g Oral BID  . [START ON 05/17/2015] levothyroxine  125 mcg Oral QAC breakfast  . pantoprazole  40 mg Oral BID  . sodium chloride  3 mL Intravenous Q12H      Electrolyte/Renal Profile and Glucose Profile:   Recent Labs Lab 05/15/15 1438 05/16/15 0731  NA 126* 132*  K 3.7 3.6  CL 90* 99*  CO2 24 24  BUN 55* 52*  CREATININE 2.20* 1.94*  CALCIUM 8.4* 8.2*  GLUCOSE  163* 101*   Protein Profile:  Recent Labs Lab 05/15/15 1438  ALBUMIN 3.2*    Gastrointestinal Profile: Last BM:10/21      Weight Change: stable wt    Diet Order:  Diet Heart Room service appropriate?: Yes; Fluid consistency:: Thin  Skin:   reviewed   Height:   Ht Readings from Last 1 Encounters:  05/15/15 5\' 8"  (1.727 m)    Weight:   Wt Readings from Last 1 Encounters:  05/16/15 155 lb 3.2 oz (70.398 kg)    BMI:  Body mass index is 23.6 kg/(m^2).  EDUCATION NEEDS:   No education needs identified at this time  LOW Care Level   Godfrey Tritschler B. Zenia Resides, [place], [place] (pager)

## 2015-05-16 NOTE — Progress Notes (Signed)
A&O. Weak. 1 unit of blood given last night. Enema given last night as ordered with good results. Several large loose stools resulted. No complaints. On 2 L O2.

## 2015-05-16 NOTE — Progress Notes (Signed)
Dr. Eliott Nine gave primary RN  verbal orders for soap suds enema q 8 hrs.

## 2015-05-16 NOTE — Consult Note (Signed)
GI Inpatient Consult Note  Reason for Consult: Constipation   Attending Requesting Consult: [place]  History of Present Illness: Jeffrey Gallegos is a 80 y.o. male with a history of multiple chronic cardiac conditions, CKD III, DM, and IDA admitted for acute on chronic CHF.  He currently followed by Dr. Rayann Heman at [facility] for IDA and was schedulee to have a colonoscopy and EGD today.  Patient called the clinic yesterday afternoon with concerns about constipation x 1-2 weeks, decreased urinary output, lower leg edema, and 8 lb weight gain.  He had tried OTCs including Miralax, stool softeners, and enemas without relief, and expressed concern that he could not complete the bowel prep for colonoscopy.  I recommended [facility] for these symptoms.  In the ED, troponins x3 were elevated at 0.6-0.7, BNP 1537.  Hgb 7.9, Hct 25.8, Plt 133, alk phos 148, total bilirubin 1.5, albumin 3.2, BUN 55, Cr 2.2.  He was transfused 1 unit PRBCs and admitted for further management of acute on chronic CHF, hyponatremia, and anemia. Hgb improved to 8.7 this morning.    Since admission, patient received a SMOG enema has provided a little relief.  Patient states he had a few BMs after completing the enema, but he has not had another BM since this morning.  He also reports decreased abdominal tightness, fullness, and improved LE edema.  He denies hematochezia, or melena with BMs.  Patient notes he takes about 4 stool softeners and 4 "natural softening supplements" at home to help with constipation, but has never been on a good regimen.    Also no new GI complaints like dysphagia, reflux, nausea, vomiting or abdominal pain. Patient notes that he is still very concerned about constipation and lack of urinary output, and would like to get this straightened out before discharge so he can resume his normal active lifestyle.  Past Medical History:  Past Medical History  Diagnosis Date  . History of MI  (myocardial infarction) 2011  . History of mumps     Childhood  . Coronary artery disease   . Hypertension   . Sleep apnea   . Thyroid disease     synthroid  . Anemia   . Vitamin D deficiency   . Chronic systolic CHF (congestive heart failure) ([place])   . HLD (hyperlipidemia)   . GERD (gastroesophageal reflux disease)   . A-fib ([place])     on eliquis  . BPH (benign prostatic hyperplasia)   . CKD (chronic kidney disease), stage III     Problem List: Patient Active Problem List   Diagnosis Date Noted  . Acute on chronic systolic CHF (congestive heart failure) ([place]) 05/15/2015  . Constipation 05/15/2015  . Anemia 05/15/2015  . Hyponatremia 05/15/2015  . Chronic kidney disease 02/21/2015  . Bradycardia 01/16/2015  . A-fib ([place]) 10/26/2014  . Basal cell carcinoma of skin 10/26/2014  . BPH (benign prostatic hyperplasia) 10/26/2014  . Arteriosclerosis of coronary artery 10/26/2014  . ED (erectile dysfunction) of organic origin 10/26/2014  . Hyperlipidemia 10/26/2014  . BP (high blood pressure) 10/26/2014  . Adult hypothyroidism 10/26/2014  . Diabetes ([place]) 10/26/2014  . Polypharmacy 10/26/2014  . Chronic systolic heart failure ([place]) 09/18/2014  . Cardiomyopathy, ischemic 09/18/2014  . Partial anomalous pulmonary venous connection 09/18/2014  . Disorder of right ventricle of heart 09/18/2014  . Atrial fibrillation, chronic ([place]) 09/18/2014  . Fecal occult blood test positive 08/28/2014  . 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 Benign essential HTN 10/20/2013  . CAD in native artery 10/20/2013  . Severe tricuspid regurgitation 01/24/2012    Past Surgical History: Past Surgical History  Procedure Laterality Date  . Coronary artery bypass graft  11/27/2009    4V at [facility]  . Lasik Bilateral 2002  . Cyst excision Right     Foot  . Leg surgery      Right leg fracture  . Cardiac catheterization    . Electrophysiologic study N/A 01/13/2015     Procedure: CARDIOVERSION;  Surgeon: Teodoro Spray, MD;  Location: [facility] ORS;  Service: Cardiovascular;  Laterality: N/A;    Allergies: No Known Allergies  Home Medications: Prescriptions prior to admission  Medication Sig Dispense Refill Last Dose  . Acetaminophen (TYLENOL PO) Take by mouth as needed.   prn at prn  . amiodarone (PACERONE) 200 MG tablet Take 200 mg by mouth daily.    05/15/2015 at Unknown time  . atorvastatin (LIPITOR) 80 MG tablet TAKE ONE (1) TABLET BY MOUTH EVERY DAY 30 tablet 0 05/15/2015 at Unknown time  . carvedilol (COREG) 3.125 MG tablet Take 1 tablet by mouth 2 (two) times daily.  2 05/15/2015 at Unknown time  . ELIQUIS 5 MG TABS tablet Take 5 mg by mouth 2 (two) times daily.   2 05/15/2015 at Unknown time  . levothyroxine (SYNTHROID, LEVOTHROID) 112 MCG tablet Take 1 tablet (112 mcg total) by mouth daily before breakfast. 30 tablet 3 05/15/2015 at Unknown time  . naproxen sodium (ANAPROX) 220 MG tablet Take 220 mg by mouth 2 (two) times daily with a meal. PRN for pain   prn at prn  . nitroGLYCERIN (NITROSTAT) 0.4 MG SL tablet Place 0.4 mg under the tongue every 5 (five) minutes as needed for chest pain.   prn at prn  . pantoprazole (PROTONIX) 40 MG tablet TAKE ONE (1) TABLET BY MOUTH EVERY DAY 30 tablet 12 05/15/2015 at Unknown time  . Saw [place]metto, Serenoa repens, 450 MG CAPS Take 3 tablets by mouth 2 (two) times daily.   05/15/2015 at Unknown time  . spironolactone (ALDACTONE) 25 MG tablet Take 25 mg by mouth daily.   2 05/15/2015 at Unknown time  . tadalafil (CIALIS) 5 MG tablet Take 1 tablet by mouth daily.   prn at prn  . torsemide (DEMADEX) 10 MG tablet Take by mouth. Takes 20 in the morning and 10 in the evening   05/15/2015 at Unknown time   Home medication reconciliation was completed with the patient.   Scheduled Inpatient Medications:   . amiodarone  200 mg Oral Daily  . antiseptic oral rinse  7 mL Mouth Rinse q12n4p  . atorvastatin  80 mg Oral q1800   . carvedilol  3.125 mg Oral BID  . chlorhexidine  15 mL Mouth Rinse BID  . furosemide  20 mg Intravenous Once  . furosemide  20 mg Intravenous Once  . furosemide  40 mg Intravenous BID  . lactulose  30 g Oral BID  . [START ON 05/17/2015] levothyroxine  125 mcg Oral QAC breakfast  . pantoprazole  40 mg Oral BID  . sodium chloride  3 mL Intravenous Q12H    Continuous Inpatient Infusions:     PRN Inpatient Medications:  acetaminophen **OR** acetaminophen, ondansetron **OR** ondansetron (ZOFRAN) IV  Family History: family history includes Cancer in his mother; Heart attack in his father, maternal uncle, paternal uncle, and paternal uncle; Heart failure in his sister; Seizures in his sister; Stroke in  his sister; Ulcers in his sister.    Social History:   reports that he quit smoking about 46 years ago. His smoking use included Cigarettes. His smokeless tobacco use includes Chew. He reports that he drinks alcohol. He reports that he does not use illicit drugs.   Review of Systems: Constitutional: Weight is stable.  Eyes: No changes in vision. ENT: No oral lesions, sore throat.  GI: see HPI.  Heme/Lymph: No easy bruising.  CV: No chest pain; + LE swelling  GU: No hematuria.  Integumentary: No rashes.  Neuro: No headaches.  Psych: No depression/anxiety.  Endocrine: No heat/cold intolerance.  Allergic/Immunologic: No urticaria.  Resp: No cough; + SOB  Musculoskeletal: No joint swelling.    Physical Examination: BP 102/59 mmHg  Pulse 60  Temp(Src) 97.9 F (36.6 C) (Oral)  Resp 18  Ht _0  (1.727 m)  Wt 70.398 kg (155 lb 3.2 oz)  BMI 23.60 kg/m2  SpO2 95% Gen: NAD, alert and oriented x 4 HEENT: PEERLA, EOMI, Neck: supple, no JVD or thyromegaly Chest: CTA bilaterally, no wheezes, crackles, or other adventitious sounds CV: RRR, no m/g/c/r Abd: soft, mildy distended with mild TTP diffusely thoroughout abdomen, +BS in all four quadrants; no HSM, guarding, ridigity, or  rebound tenderness Ext: no edema, well perfused with 2+ pulses, Skin: no rash or lesions noted Lymph: no LAD  Data: Lab Results  Component Value Date   WBC 10.3 05/16/2015   HGB 8.7* 05/16/2015   HCT 27.1* 05/16/2015   MCV 79.8* 05/16/2015   PLT 123* 05/16/2015    Recent Labs Lab 05/15/15 1438 05/16/15 0731  HGB 7.9* 8.7*   Lab Results  Component Value Date   NA 132* 05/16/2015   K 3.6 05/16/2015   CL 99* 05/16/2015   CO2 24 05/16/2015   BUN 52* 05/16/2015   CREATININE 1.94* 05/16/2015   GLU 103 08/26/2014   Lab Results  Component Value Date   ALT 24 05/15/2015   AST 39 05/15/2015   ALKPHOS 148* 05/15/2015   BILITOT 1.5* 05/15/2015   No results for input(s): APTT, INR, PTT in the last 168 hours. Assessment/Plan: Mr. Castagna is a 80 y.o. male with a history of multiple chronic cardiac conditions, CKD III, DM, and IDA admitted for acute on chronic CHF.  Colonoscopy and EGD for IDA work-up was scheduled for today with Dr. Rayann Heman, however patient called [facility] yesterday with severe constipation, urinary retention, LE edema, and weight gain.  ED evaluation was recommended, followed by admission for acute on chronic CHF.  Recommend another soap suds enema to continue relieving constipation.  Will consider procedures on Monday vs rescheduling as outpatient based on progress and symptoms over the weekend.  If Hgb drops or bleeding occurs, will consider sooner.  Will continue to follow.  Recommendations: - Patient may be placed on a heart-healthy, low sodium diet - Soap suds enema - Monitor CBC, transfuse Hgb <7 - Hold off on procedures pending work-up of cardiac and volume issues; will consider performing on Monday vs rescheduling as outpatient based on patient's progress over the weekend  Thank you for the consult. We will follow along with you. Please call with questions or concerns.  Lavera Guise, PA-C [facility] Phone: [phone] Pager:  [phone]

## 2015-05-16 NOTE — Care Management (Signed)
CM assessment due to age and medial diagnosis.  Admitted with exac of chf.  Mild bump in troponin.  has required 1 unit packed red blood cells for low hgb.  IV lasix.  GI to follow to rule out gib.  Patient is usually very very active but he has had a decline over the last year and work ups have not identified cause.  he is not on chronic home 02.  No problems accessing medical care, obtaining medications or transportation.  At present, have not identified any discharge needs

## 2015-05-16 NOTE — Discharge Instructions (Signed)
Heart Failure Clinic appointment on May 30, 2015 at 9:00am with Darylene Price, [place]. Please call [phone] to reschedule.

## 2015-05-16 NOTE — Progress Notes (Signed)
Pt BP low. Primary RN notified  Dr. Leslye Peer; Orders to hold Coreg and [place]. Primary RN to continue to monitor.

## 2015-05-16 NOTE — Progress Notes (Signed)
Patient ID: Jeffrey Gallegos, male   DOB: 06/01/1935, 80 y.o.   MRN: [id number] [facility] PROGRESS NOTE  PCP: [name], MD  HPI/Subjective: Patient feeling short of breath when walking. Has not had a bowel movement for 1 week until 3 in the morning today. Also has trouble urinating if he doesn't move his bowels. He was supposed to have a colonoscopy and endoscopy today by Dr. Rayann Heman but ended up in the hospital instead. He was transfused 1 unit of blood yesterday.  Objective: Filed Vitals:   05/16/15 0415  BP: 97/58  Pulse: 66  Temp: 97.1 F (36.2 C)  Resp: 18    Filed Weights   05/15/15 1404 05/15/15 1409 05/16/15 0415  Weight: 67.132 kg (148 lb) 67.132 kg (148 lb) 70.398 kg (155 lb 3.2 oz)    ROS: Review of Systems  Constitutional: Negative for fever and chills.  Eyes: Negative for blurred vision.  Respiratory: Positive for cough and shortness of breath.   Cardiovascular: Negative for chest pain.  Gastrointestinal: Positive for constipation. Negative for nausea, vomiting, abdominal pain and diarrhea.  Genitourinary: Negative for dysuria.  Musculoskeletal: Negative for joint pain.  Neurological: Negative for dizziness and headaches.   Exam: Physical Exam  Constitutional: He is oriented to person, place, and time.  HENT:  Nose: No mucosal edema.  Mouth/Throat: No oropharyngeal exudate or posterior oropharyngeal edema.  Eyes: Conjunctivae, EOM and lids are normal. Pupils are equal, round, and reactive to light.  Neck: No JVD present. Carotid bruit is not present. No edema present. No thyroid mass and no thyromegaly present.  Cardiovascular: S1 normal and S2 normal.  Exam reveals no gallop.   No murmur heard. Pulses:      Dorsalis pedis pulses are 1+ on the right side, and 1+ on the left side.  Respiratory: No respiratory distress. He has no wheezes. He has no rhonchi. He has rales in the right lower field and the left lower field.  GI: Soft. Bowel  sounds are normal. There is no tenderness.  Musculoskeletal:       Right ankle: He exhibits swelling.       Left ankle: He exhibits swelling.  Lymphadenopathy:    He has no cervical adenopathy.  Neurological: He is alert and oriented to person, place, and time. No cranial nerve deficit.  Skin: Skin is warm. No rash noted. Nails show no clubbing.  Psychiatric: He has a normal mood and affect.    Data Reviewed: Basic Metabolic Panel:  Recent Labs Lab 05/15/15 1438 05/16/15 0731  NA 126* 132*  K 3.7 3.6  CL 90* 99*  CO2 24 24  GLUCOSE 163* 101*  BUN 55* 52*  CREATININE 2.20* 1.94*  CALCIUM 8.4* 8.2*   Liver Function Tests:  Recent Labs Lab 05/15/15 1438  AST 39  ALT 24  ALKPHOS 148*  BILITOT 1.5*  PROT 6.6  ALBUMIN 3.2*    Recent Labs Lab 05/15/15 1438  LIPASE 52*   CBC:  Recent Labs Lab 05/15/15 1438 05/16/15 0731  WBC 7.0 10.3  HGB 7.9* 8.7*  HCT 25.8* 27.1*  MCV 79.3* 79.8*  PLT 133* 123*   Cardiac Enzymes:  Recent Labs Lab 05/15/15 1438 05/15/15 2100 05/16/15 0131 05/16/15 0731  TROPONINI 0.06* 0.06* 0.07* 0.06*   BNP (last 3 results)  Recent Labs  05/15/15 1438  BNP 1537.0*    Studies: Ct Abdomen Pelvis Wo Contrast  05/15/2015  CLINICAL DATA:  Abdominal pain with constipation for the past 1-2  weeks. Decreased urinary output with increased fluid retention. EXAM: CT ABDOMEN AND PELVIS WITHOUT CONTRAST TECHNIQUE: Multidetector CT imaging of the abdomen and pelvis was performed following the standard protocol without IV contrast. COMPARISON:  None. FINDINGS: The lack of intravenous contrast limits the ability to evaluate solid abdominal organs. There is mild nodularity of the hepatic contour. There are several layering radiopaque gallstones within otherwise normal-appearing gallbladder. There is a trace amount of perihepatic ascites. Normal noncontrast appearance of the bilateral kidneys. No renal stones. No urinary obstruction or  perinephric stranding. Normal noncontrast appearance of bilateral adrenal glands, pancreas and spleen. A small amount of fluid is noted within the left upper abdominal quadrant. Ingested enteric contrast extends to the level of the cecum. Large colonic stool burden without definite evidence of enteric obstruction. No pneumoperitoneum, pneumatosis or portal venous gas. Large amount of eccentric irregular calcified atherosclerotic plaque within a normal caliber abdominal aorta. No definitive bulky retroperitoneal, mesenteric, pelvic or inguinal lymphadenopathy on this noncontrast examination. There is a small amount of fluid seen within the pelvic cul-de-sac. Normal noncontrast appearance of the urinary bladder given degree distention. Limited visualization of the lower thorax demonstrates cardiomegaly. Pacer leads are seen within the right atrium, ventricle and coronary sinus. There is diffuse decreased attenuation of the intra cardiac blood pool suggestive of anemia. Calcifications within the aortic valve leaflets. No pericardial effusion. Trace right-sided pleural effusion. Minimal dependent subpleural ground-glass atelectasis. No acute or aggressive osseous abnormalities. Mild-to-moderate multilevel lumbar spine DDD, worse at L4-L5 with disc space height loss, endplate irregularity and sclerosis. Mild diffuse body wall anasarca. Small bilateral mesenteric fat containing inguinal hernias. IMPRESSION: 1. Mild nodularity hepatic contour suggestive of mild cirrhotic change. 2. Small volume intra-abdominal ascites. 3. Cholelithiasis without definite evidence of cholecystitis on this noncontrast examination. 4. No evidence of nephrolithiasis or urinary obstruction. 5. Large colonic stool burden without evidence of enteric obstruction. 6. Cardiomegaly. 7. Suspected anemia. Electronically Signed   By: Sandi Mariscal M.D.   On: 05/15/2015 17:16   Dg Abd Acute W/chest  05/15/2015  CLINICAL DATA:  1-2 week history of  constipation decreased urinary output with increased fluid retention and waking. EXAM: DG ABDOMEN ACUTE W/ 1V CHEST COMPARISON:  Chest x-ray 10/21/2009 FINDINGS: The upright chest x-ray demonstrates mild stable cardiac enlargement. There are surgical changes from triple bypass surgery and there are right atrial, right ventricular and coronary sinus pacer wires in good position without complicating features. The lungs are clear. Possible small right effusion. No pulmonary edema. Moderate stool throughout the colon and down into the rectum suggesting constipation. No findings for small bowel obstruction or free air. The soft tissue shadows are maintained the bony structures are intact. Mild scoliosis. IMPRESSION: Mild stable cardiac enlargement but no acute pulmonary findings. Moderate to large amount of stool throughout the colon consistent with constipation. Electronically Signed   By: Marijo Sanes M.D.   On: 05/15/2015 15:29    Scheduled Meds: . amiodarone  200 mg Oral Daily  . antiseptic oral rinse  7 mL Mouth Rinse q12n4p  . atorvastatin  80 mg Oral q1800  . carvedilol  3.125 mg Oral BID  . chlorhexidine  15 mL Mouth Rinse BID  . furosemide  20 mg Intravenous Once  . furosemide  20 mg Intravenous Once  . furosemide  40 mg Intravenous BID  . lactulose  30 g Oral BID  . [START ON 05/17/2015] levothyroxine  125 mcg Oral QAC breakfast  . pantoprazole  40 mg Oral BID  .  sodium chloride  3 mL Intravenous Q12H     Assessment/Plan:  1. Acute on chronic systolic congestive heart failure. Patient states he takes torsemide 60 mg twice a day at home. I will give Lasix 40 mg IV twice a day here. 2. Hyponatremia- likely from heart failure. Watch with diuresis. Sodium improved today. 3. Relative hypotension - continue IV Lasix. Hold Aldactone. Nurse will check blood pressure later and give Coreg if blood pressure is up. 4. Anemia unspecified- patient was given 1 unit of packed red blood cells with good  response. GI was supposed to do outpatient colonoscopy and endoscopy today. Since the patient did not take a prep unlikely to do colonoscopy. I spoke with Dr. Jackalyn Lombard endoscopy nurse and said he is nothing by mouth if they wanted to do the endoscopy. 5. Hyperlipidemia unspecified continue atorvastatin 6. Atrial fibrillation- on amiodarone and Coreg for rate control 7. Chronic kidney disease stage III watch with diuresis 8. Hypothyroidism unspecified TSH high increased dose of levothyroxine. 9. Constipation- lactulose twice a day 10. CT scan showing signs of cirrhosis- patient states that he does not drink alcohol and no history of this in the past. I will check hepatitis profiles. GI for evaluation.  Code Status:     Code Status Orders        Start     Ordered   05/15/15 1954  Full code   Continuous     05/15/15 1953     Disposition Plan: Home soon  Consultants:  Gastroenterology  Time spent: 25 minutes  Loletha Grayer  [facility]

## 2015-05-16 NOTE — Progress Notes (Signed)
Primary RN spoke to Dr. Rayann Heman in regard to pt diet order. Orders received for heart healthy low sodium. Primary RN to continue to monitor.

## 2015-05-16 NOTE — Telephone Encounter (Signed)
I left a vm asking Agostino if he was planning on returning to Cardiac Rehab.

## 2015-05-17 LAB — HEMOGLOBIN: HEMOGLOBIN: 8.7 g/dL — AB (ref 13.0–18.0)

## 2015-05-17 LAB — TYPE AND SCREEN
ABO/RH(D): O POS
ANTIBODY SCREEN: NEGATIVE
Unit division: 0

## 2015-05-17 LAB — HEPATITIS C ANTIBODY

## 2015-05-17 LAB — BASIC METABOLIC PANEL
Anion gap: 9 (ref 5–15)
BUN: 48 mg/dL — AB (ref 6–20)
CO2: 25 mmol/L (ref 22–32)
CREATININE: 1.95 mg/dL — AB (ref 0.61–1.24)
Calcium: 8.2 mg/dL — ABNORMAL LOW (ref 8.9–10.3)
Chloride: 96 mmol/L — ABNORMAL LOW (ref 101–111)
GFR, EST AFRICAN AMERICAN: 36 mL/min — AB (ref >60)
GFR, EST NON AFRICAN AMERICAN: 31 mL/min — AB (ref >60)
Glucose, Bld: 128 mg/dL — ABNORMAL HIGH (ref 65–99)
Potassium: 3.4 mmol/L — ABNORMAL LOW (ref 3.5–5.1)
SODIUM: 130 mmol/L — AB (ref 135–145)

## 2015-05-17 LAB — HEPATITIS B SURFACE ANTIGEN: HEP B S AG: NEGATIVE

## 2015-05-17 LAB — HEPATITIS B SURFACE ANTIBODY, QUANTITATIVE: Hepatitis B-Post: <3.1 m[IU]/mL — ABNORMAL LOW (ref >9.9)

## 2015-05-17 LAB — HEPATITIS B CORE ANTIBODY, TOTAL: HEP B C TOTAL AB: NEGATIVE

## 2015-05-17 MED ORDER — DIPHENHYDRAMINE HCL 25 MG PO CAPS
25.0000 mg | ORAL_CAPSULE | Freq: Every evening | ORAL | Status: DC | PRN
  Administered 2015-05-17 – 2015-05-18 (×2): 25 mg via ORAL
  Filled 2015-05-17 (×2): qty 1

## 2015-05-17 MED ORDER — TORSEMIDE 5 MG PO TABS
10.0000 mg | ORAL_TABLET | Freq: Two times a day (BID) | ORAL | Status: DC
  Administered 2015-05-17 – 2015-05-18 (×3): 10 mg via ORAL
  Filled 2015-05-17 (×3): qty 2

## 2015-05-17 NOTE — Progress Notes (Signed)
Patient ID: Jeffrey Gallegos, male   DOB: 05-02-1935, 80 y.o.   MRN: [id number] [facility] PROGRESS NOTE  PCP: [name], MD  HPI/Subjective: Patient had good bowel movement. His shortness of breath is improved. Wishes to keep the Foley in.   Objective: Filed Vitals:   05/17/15 1007  BP: 98/55  Pulse:   Temp:   Resp:     Filed Weights   05/15/15 1404 05/15/15 1409 05/16/15 0415  Weight: 67.132 kg (148 lb) 67.132 kg (148 lb) 70.398 kg (155 lb 3.2 oz)    ROS: Review of Systems  Constitutional: Negative for fever and chills.  Eyes: Negative for blurred vision.  Respiratory: Positive for cough and shortness of breath.   Cardiovascular: Negative for chest pain.  Gastrointestinal: Improved constipation. Negative for nausea, vomiting, abdominal pain and diarrhea.  Genitourinary: Negative for dysuria.  Musculoskeletal: Negative for joint pain.  Neurological: Negative for dizziness and headaches.   Exam: Physical Exam  Constitutional: He is oriented to person, place, and time.  HENT:  Nose: No mucosal edema.  Mouth/Throat: No oropharyngeal exudate or posterior oropharyngeal edema.  Eyes: Conjunctivae, EOM and lids are normal. Pupils are equal, round, and reactive to light.  Neck: No JVD present. Carotid bruit is not present. No edema present. No thyroid mass and no thyromegaly present.  Cardiovascular: S1 normal and S2 normal.  Exam reveals no gallop.   No murmur heard. Pulses:      Dorsalis pedis pulses are 1+ on the right side, and 1+ on the left side.  Respiratory: No respiratory distress. He has no wheezes. He has no rhonchi. He has rales in the right lower field and the left lower field.  GI: Soft. Bowel sounds are normal. There is no tenderness.  Musculoskeletal:       Right ankle: He exhibits swelling.       Left ankle: He exhibits swelling.  Lymphadenopathy:    He has no cervical adenopathy.  Neurological: He is alert and oriented to person,  place, and time. No cranial nerve deficit.  Skin: Skin is warm. No rash noted. Nails show no clubbing.  Psychiatric: He has a normal mood and affect.    Data Reviewed: Basic Metabolic Panel:  Recent Labs Lab 05/15/15 1438 05/16/15 0731 05/17/15 0523  NA 126* 132* 130*  K 3.7 3.6 3.4*  CL 90* 99* 96*  CO2 24 24 25   GLUCOSE 163* 101* 128*  BUN 55* 52* 48*  CREATININE 2.20* 1.94* 1.95*  CALCIUM 8.4* 8.2* 8.2*   Liver Function Tests:  Recent Labs Lab 05/15/15 1438  AST 39  ALT 24  ALKPHOS 148*  BILITOT 1.5*  PROT 6.6  ALBUMIN 3.2*    Recent Labs Lab 05/15/15 1438  LIPASE 52*   CBC:  Recent Labs Lab 05/15/15 1438 05/16/15 0731 05/17/15 0523  WBC 7.0 10.3  --   HGB 7.9* 8.7* 8.7*  HCT 25.8* 27.1*  --   MCV 79.3* 79.8*  --   PLT 133* 123*  --    Cardiac Enzymes:  Recent Labs Lab 05/15/15 1438 05/15/15 2100 05/16/15 0131 05/16/15 0731  TROPONINI 0.06* 0.06* 0.07* 0.06*   BNP (last 3 results)  Recent Labs  05/15/15 1438  BNP 1537.0*    Studies: Ct Abdomen Pelvis Wo Contrast  05/15/2015  CLINICAL DATA:  Abdominal pain with constipation for the past 1-2 weeks. Decreased urinary output with increased fluid retention. EXAM: CT ABDOMEN AND PELVIS WITHOUT CONTRAST TECHNIQUE: Multidetector CT imaging of the  abdomen and pelvis was performed following the standard protocol without IV contrast. COMPARISON:  None. FINDINGS: The lack of intravenous contrast limits the ability to evaluate solid abdominal organs. There is mild nodularity of the hepatic contour. There are several layering radiopaque gallstones within otherwise normal-appearing gallbladder. There is a trace amount of perihepatic ascites. Normal noncontrast appearance of the bilateral kidneys. No renal stones. No urinary obstruction or perinephric stranding. Normal noncontrast appearance of bilateral adrenal glands, pancreas and spleen. A small amount of fluid is noted within the left upper abdominal  quadrant. Ingested enteric contrast extends to the level of the cecum. Large colonic stool burden without definite evidence of enteric obstruction. No pneumoperitoneum, pneumatosis or portal venous gas. Large amount of eccentric irregular calcified atherosclerotic plaque within a normal caliber abdominal aorta. No definitive bulky retroperitoneal, mesenteric, pelvic or inguinal lymphadenopathy on this noncontrast examination. There is a small amount of fluid seen within the pelvic cul-de-sac. Normal noncontrast appearance of the urinary bladder given degree distention. Limited visualization of the lower thorax demonstrates cardiomegaly. Pacer leads are seen within the right atrium, ventricle and coronary sinus. There is diffuse decreased attenuation of the intra cardiac blood pool suggestive of anemia. Calcifications within the aortic valve leaflets. No pericardial effusion. Trace right-sided pleural effusion. Minimal dependent subpleural ground-glass atelectasis. No acute or aggressive osseous abnormalities. Mild-to-moderate multilevel lumbar spine DDD, worse at L4-L5 with disc space height loss, endplate irregularity and sclerosis. Mild diffuse body wall anasarca. Small bilateral mesenteric fat containing inguinal hernias. IMPRESSION: 1. Mild nodularity hepatic contour suggestive of mild cirrhotic change. 2. Small volume intra-abdominal ascites. 3. Cholelithiasis without definite evidence of cholecystitis on this noncontrast examination. 4. No evidence of nephrolithiasis or urinary obstruction. 5. Large colonic stool burden without evidence of enteric obstruction. 6. Cardiomegaly. 7. Suspected anemia. Electronically Signed   By: Sandi Mariscal M.D.   On: 05/15/2015 17:16   Dg Abd Acute W/chest  05/15/2015  CLINICAL DATA:  1-2 week history of constipation decreased urinary output with increased fluid retention and waking. EXAM: DG ABDOMEN ACUTE W/ 1V CHEST COMPARISON:  Chest x-ray 10/21/2009 FINDINGS: The upright  chest x-ray demonstrates mild stable cardiac enlargement. There are surgical changes from triple bypass surgery and there are right atrial, right ventricular and coronary sinus pacer wires in good position without complicating features. The lungs are clear. Possible small right effusion. No pulmonary edema. Moderate stool throughout the colon and down into the rectum suggesting constipation. No findings for small bowel obstruction or free air. The soft tissue shadows are maintained the bony structures are intact. Mild scoliosis. IMPRESSION: Mild stable cardiac enlargement but no acute pulmonary findings. Moderate to large amount of stool throughout the colon consistent with constipation. Electronically Signed   By: Marijo Sanes M.D.   On: 05/15/2015 15:29    Scheduled Meds: . amiodarone  200 mg Oral Daily  . antiseptic oral rinse  7 mL Mouth Rinse q12n4p  . atorvastatin  20 mg Oral q1800  . carvedilol  3.125 mg Oral BID  . chlorhexidine  15 mL Mouth Rinse BID  . furosemide  20 mg Intravenous Once  . furosemide  20 mg Intravenous Once  . lactulose  30 g Oral BID  . levothyroxine  125 mcg Oral QAC breakfast  . pantoprazole  40 mg Oral BID  . sodium chloride  3 mL Intravenous Q12H  . torsemide  10 mg Oral BID     Assessment/Plan:  1. Acute on chronic systolic congestive heart failure. Due to  low blood pressure 2. Hyponatremia- likely from heart failure. Stable continue to monitor 3. Relative hypotension - I'll stop his IV Lasix will hold low-dose Coreg for this morning 4. Anemia unspecified- patient was given 1 unit of packed red blood cells with good response. EGD and a colonoscopy on Monday 5. Hyperlipidemia unspecified continue atorvastatin 6. Atrial fibrillation- on amiodarone and Coreg for rate control 7. Chronic kidney disease stage III watch with diuresis 8. Hypothyroidism unspecified TSH high  dose of levothyroxine increased. 9. Constipation- lactulose twice a day now resolved with  enema 10. CT scan showing signs of cirrhosis- patient states that he does not drink alcohol and no history of this in the past. Hepatitis panel negative.  Code Status:     Code Status Orders        Start     Ordered   05/15/15 1954  Full code   Continuous     05/15/15 1953     Disposition Plan: Home soon  Consultants:  Gastroenterology  Time spent: 25 minutes  [place], [facility]

## 2015-05-17 NOTE — Progress Notes (Signed)
Carvedilol 3. 125 mg held due to BP=93/55 per Dr. Samantha Crimes order. Patient requested for enema and Dr. Lavetta Nielsen notified with a  new order to give  Soap sud enema. Patient tolerated well. Will continue to monitor.

## 2015-05-17 NOTE — Progress Notes (Signed)
Spoke with patient to make aware md placed an  Order to d/c foley. However patient stated " i dont' wont to take my foley out yet, I would rather leave it in" Made patient aware i will talk with md and let him know patients request

## 2015-05-17 NOTE — Progress Notes (Signed)
Spoke with dr patel to make aware of patients blood pressure and scheduled coreg and amiodarone this am. Per md hold coreg dose only. Also made md aware patient has an order to discontinue foley however patient refusing. Per md okay to leave foley in and discontinue order to remove foley

## 2015-05-18 LAB — GLUCOSE, CAPILLARY: GLUCOSE-CAPILLARY: 125 mg/dL — AB (ref 65–99)

## 2015-05-18 LAB — BASIC METABOLIC PANEL
ANION GAP: 9 (ref 5–15)
BUN: 45 mg/dL — ABNORMAL HIGH (ref 6–20)
CO2: 24 mmol/L (ref 22–32)
Calcium: 8.2 mg/dL — ABNORMAL LOW (ref 8.9–10.3)
Chloride: 100 mmol/L — ABNORMAL LOW (ref 101–111)
Creatinine, Ser: 1.77 mg/dL — ABNORMAL HIGH (ref 0.61–1.24)
GFR calc Af Amer: 41 mL/min — ABNORMAL LOW (ref >60)
GFR, EST NON AFRICAN AMERICAN: 35 mL/min — AB (ref >60)
Glucose, Bld: 156 mg/dL — ABNORMAL HIGH (ref 65–99)
POTASSIUM: 3.3 mmol/L — AB (ref 3.5–5.1)
SODIUM: 133 mmol/L — AB (ref 135–145)

## 2015-05-18 LAB — HEMOGLOBIN: HEMOGLOBIN: 8.5 g/dL — AB (ref 13.0–18.0)

## 2015-05-18 MED ORDER — POTASSIUM CHLORIDE CRYS ER 20 MEQ PO TBCR
20.0000 meq | EXTENDED_RELEASE_TABLET | Freq: Once | ORAL | Status: AC
  Administered 2015-05-18: 20 meq via ORAL
  Filled 2015-05-18: qty 1

## 2015-05-18 MED ORDER — POLYETHYLENE GLYCOL 3350 17 GM/SCOOP PO POWD
1.0000 | Freq: Once | ORAL | Status: AC
  Administered 2015-05-19: 255 g via ORAL
  Filled 2015-05-18: qty 255

## 2015-05-18 MED ORDER — SODIUM CHLORIDE 0.9 % IV SOLN
INTRAVENOUS | Status: DC
  Administered 2015-05-18: 23:00:00 via INTRAVENOUS

## 2015-05-18 MED ORDER — POLYETHYLENE GLYCOL 3350 17 GM/SCOOP PO POWD
1.0000 | Freq: Once | ORAL | Status: AC
  Administered 2015-05-18: 255 g via ORAL
  Filled 2015-05-18: qty 255

## 2015-05-18 MED ORDER — POTASSIUM CHLORIDE CRYS ER 20 MEQ PO TBCR
40.0000 meq | EXTENDED_RELEASE_TABLET | Freq: Once | ORAL | Status: AC
  Administered 2015-05-18: 40 meq via ORAL
  Filled 2015-05-18: qty 2

## 2015-05-18 NOTE — Progress Notes (Signed)
GI Inpatient Follow-up Note  Patient Identification: Jeffrey Gallegos is a 80 y.o. male with IDA, constipation, possible anal stricture  Subjective:  Less constipated today. Tolerating clear liquids.  No melean, no rectal bleeding, no abd pain.  No f/c.   Scheduled Inpatient Medications:  . amiodarone  200 mg Oral Daily  . antiseptic oral rinse  7 mL Mouth Rinse q12n4p  . atorvastatin  20 mg Oral q1800  . carvedilol  3.125 mg Oral BID  . chlorhexidine  15 mL Mouth Rinse BID  . furosemide  20 mg Intravenous Once  . furosemide  20 mg Intravenous Once  . lactulose  30 g Oral BID  . levothyroxine  125 mcg Oral QAC breakfast  . pantoprazole  40 mg Oral BID  . sodium chloride  3 mL Intravenous Q12H  . torsemide  10 mg Oral BID    Continuous Inpatient Infusions:     PRN Inpatient Medications:  acetaminophen **OR** acetaminophen, diphenhydrAMINE, ondansetron **OR** ondansetron (ZOFRAN) IV    Physical Examination: BP 102/58 mmHg  Pulse 59  Temp(Src) 97.9 F (36.6 C) (Oral)  Resp 18  Ht 5\' 8"  (1.727 m)  Wt 70.988 kg (156 lb 8 oz)  BMI 23.80 kg/m2  SpO2 93% Gen: NAD, alert and oriented x 4 Neck: supple, no JVD or thyromegaly Chest: CTA bilaterally, no wheezes, crackles, or other adventitious sounds CV: RRR, no m/g/c/r Abd: + distended, NT +BS in all four quadrants; no HSM, guarding, ridigity, or rebound tenderness Ext: no edema, well perfused with 2+ pulses, Skin: no rash or lesions noted Lymph: no LAD  Data: Lab Results  Component Value Date   WBC 10.3 05/16/2015   HGB 8.5* 05/18/2015   HCT 27.1* 05/16/2015   MCV 79.8* 05/16/2015   PLT 123* 05/16/2015    Recent Labs Lab 05/16/15 0731 05/17/15 0523 05/18/15 0509  HGB 8.7* 8.7* 8.5*   Lab Results  Component Value Date   NA 133* 05/18/2015   K 3.3* 05/18/2015   CL 100* 05/18/2015   CO2 24 05/18/2015   BUN 45* 05/18/2015   CREATININE 1.77* 05/18/2015   GLU 103 08/26/2014   Lab Results  Component Value  Date   ALT 24 05/15/2015   AST 39 05/15/2015   ALKPHOS 148* 05/15/2015   BILITOT 1.5* 05/15/2015   No results for input(s): APTT, INR, PTT in the last 168 hours.   Assessment/Plan: Jeffrey Gallegos is a 80 y.o. male with IDA, constipation, possible anal stricture   Recommendations: - EGD / Colon tomorrow, will investigate possible anal stricture at same time.  - miralax 255 gr this evening and 255 gr in am  - npo after 8 am   Please call with questions or concerns.  REIN, Grace Blight, MD

## 2015-05-18 NOTE — Progress Notes (Signed)
Patient ID: Jeffrey Gallegos, male   DOB: September 12, 1934, 80 y.o.   MRN: [id number] [facility] PROGRESS NOTE  PCP: [name], MD  HPI/Subjective:  Breathing improved denies any chest pain or shortness of breath   Objective: Filed Vitals:   05/18/15 1131  BP: 102/58  Pulse: 59  Temp: 97.9 F (36.6 C)  Resp: 18    Filed Weights   05/15/15 1409 05/16/15 0415 05/18/15 0500  Weight: 67.132 kg (148 lb) 70.398 kg (155 lb 3.2 oz) 70.988 kg (156 lb 8 oz)    ROS: Review of Systems  Constitutional: Negative for fever and chills.  Eyes: Negative for blurred vision.  Respiratory: Positive for cough and shortness of breath.   Cardiovascular: Negative for chest pain.  Gastrointestinal: Improved constipation. Negative for nausea, vomiting, abdominal pain and diarrhea.  Genitourinary: Negative for dysuria.  Musculoskeletal: Negative for joint pain.  Neurological: Negative for dizziness and headaches.   Exam: Physical Exam  Constitutional: He is oriented to person, place, and time.  HENT:  Nose: No mucosal edema.  Mouth/Throat: No oropharyngeal exudate or posterior oropharyngeal edema.  Eyes: Conjunctivae, EOM and lids are normal. Pupils are equal, round, and reactive to light.  Neck: No JVD present. Carotid bruit is not present. No edema present. No thyroid mass and no thyromegaly present.  Cardiovascular: S1 normal and S2 normal.  Exam reveals no gallop.   No murmur heard. Pulses:      Dorsalis pedis pulses are 1+ on the right side, and 1+ on the left side.  Respiratory: No respiratory distress. He has no wheezes. He has no rhonchi. He has rales in the right lower field and the left lower field.  GI: Soft. Bowel sounds are normal. There is no tenderness.  Musculoskeletal:       Right ankle: He exhibits swelling.       Left ankle: He exhibits swelling.  Lymphadenopathy:    He has no cervical adenopathy.  Neurological: He is alert and oriented to person, place, and  time. No cranial nerve deficit.  Skin: Skin is warm. No rash noted. Nails show no clubbing.  Psychiatric: He has a normal mood and affect.    Data Reviewed: Basic Metabolic Panel:  Recent Labs Lab 05/15/15 1438 05/16/15 0731 05/17/15 0523 05/18/15 0509  NA 126* 132* 130* 133*  K 3.7 3.6 3.4* 3.3*  CL 90* 99* 96* 100*  CO2 24 24 25 24   GLUCOSE 163* 101* 128* 156*  BUN 55* 52* 48* 45*  CREATININE 2.20* 1.94* 1.95* 1.77*  CALCIUM 8.4* 8.2* 8.2* 8.2*   Liver Function Tests:  Recent Labs Lab 05/15/15 1438  AST 39  ALT 24  ALKPHOS 148*  BILITOT 1.5*  PROT 6.6  ALBUMIN 3.2*    Recent Labs Lab 05/15/15 1438  LIPASE 52*   CBC:  Recent Labs Lab 05/15/15 1438 05/16/15 0731 05/17/15 0523 05/18/15 0509  WBC 7.0 10.3  --   --   HGB 7.9* 8.7* 8.7* 8.5*  HCT 25.8* 27.1*  --   --   MCV 79.3* 79.8*  --   --   PLT 133* 123*  --   --    Cardiac Enzymes:  Recent Labs Lab 05/15/15 1438 05/15/15 2100 05/16/15 0131 05/16/15 0731  TROPONINI 0.06* 0.06* 0.07* 0.06*   BNP (last 3 results)  Recent Labs  05/15/15 1438  BNP 1537.0*    Studies: No results found.  Scheduled Meds: . amiodarone  200 mg Oral Daily  . antiseptic  oral rinse  7 mL Mouth Rinse q12n4p  . atorvastatin  20 mg Oral q1800  . carvedilol  3.125 mg Oral BID  . chlorhexidine  15 mL Mouth Rinse BID  . furosemide  20 mg Intravenous Once  . furosemide  20 mg Intravenous Once  . lactulose  30 g Oral BID  . levothyroxine  125 mcg Oral QAC breakfast  . pantoprazole  40 mg Oral BID  . sodium chloride  3 mL Intravenous Q12H  . torsemide  10 mg Oral BID     Assessment/Plan:  1. Acute on chronic systolic congestive heart failure. Resolved now continue oral diuresis 2. Hyponatremia- likely from heart failure. Improved with diuresis 3. Relative hypotension -improved with stopping IV Lasix 4. Anemia unspecified- patient was given 1 unit of packed red blood cells with good response. EGD and a  colonoscopy on Monday 5. Hyperlipidemia unspecified continue atorvastatin 6. Atrial fibrillation- on amiodarone and Coreg for rate control 7. Chronic kidney disease stage III stable 8. Hypothyroidism unspecified TSH high  dose of levothyroxine increased. 9. Constipation- lactulose twice a day now resolved with enema CT scan showing signs of cirrhosis-hepatitis panel negative , could be related to [place]  Code Status:     Code Status Orders        Start     Ordered   05/15/15 1954  Full code   Continuous     05/15/15 1953     Disposition Plan: Home soon  Consultants:  Gastroenterology  Time spent: 25 minutes  [place], [place]

## 2015-05-19 ENCOUNTER — Inpatient Hospital Stay: Payer: Medicare Other | Admitting: Anesthesiology

## 2015-05-19 ENCOUNTER — Telehealth: Payer: Self-pay | Admitting: *Deleted

## 2015-05-19 ENCOUNTER — Encounter: Payer: Self-pay | Admitting: *Deleted

## 2015-05-19 ENCOUNTER — Encounter
Admission: EM | Disposition: A | Discharge status: Home / Self Care | Payer: Self-pay | Source: Home / Self Care | Attending: Internal Medicine

## 2015-05-19 ENCOUNTER — Telehealth: Payer: Self-pay | Admitting: Family Medicine

## 2015-05-19 ENCOUNTER — Encounter: Payer: Self-pay | Admitting: Anesthesiology

## 2015-05-19 DIAGNOSIS — I5022 Chronic systolic (congestive) heart failure: Secondary | ICD-10-CM

## 2015-05-19 HISTORY — PX: ESOPHAGOGASTRODUODENOSCOPY (EGD) WITH PROPOFOL: SHX5813

## 2015-05-19 HISTORY — PX: COLONOSCOPY WITH PROPOFOL: SHX5780

## 2015-05-19 LAB — KOH PREP: KOH Prep: NONE SEEN

## 2015-05-19 LAB — BASIC METABOLIC PANEL
Anion gap: 9 (ref 5–15)
BUN: 35 mg/dL — ABNORMAL HIGH (ref 6–20)
CALCIUM: 8.2 mg/dL — AB (ref 8.9–10.3)
CO2: 25 mmol/L (ref 22–32)
CREATININE: 1.59 mg/dL — AB (ref 0.61–1.24)
Chloride: 99 mmol/L — ABNORMAL LOW (ref 101–111)
GFR, EST AFRICAN AMERICAN: 46 mL/min — AB (ref >60)
GFR, EST NON AFRICAN AMERICAN: 40 mL/min — AB (ref >60)
Glucose, Bld: 116 mg/dL — ABNORMAL HIGH (ref 65–99)
Potassium: 3.8 mmol/L (ref 3.5–5.1)
SODIUM: 133 mmol/L — AB (ref 135–145)

## 2015-05-19 SURGERY — ESOPHAGOGASTRODUODENOSCOPY (EGD) WITH PROPOFOL
Anesthesia: General

## 2015-05-19 MED ORDER — PHENYLEPHRINE HCL 10 MG/ML IJ SOLN
INTRAMUSCULAR | Status: DC | PRN
  Administered 2015-05-19 (×8): 100 ug via INTRAVENOUS

## 2015-05-19 MED ORDER — PREDNISONE 20 MG PO TABS: 20.0000 mg | ORAL_TABLET | Freq: Once | ORAL | Status: DC

## 2015-05-19 MED ORDER — LIDOCAINE HCL (CARDIAC) 20 MG/ML IV SOLN
INTRAVENOUS | Status: DC | PRN
  Administered 2015-05-19: 100 mg via INTRAVENOUS

## 2015-05-19 MED ORDER — POLYETHYLENE GLYCOL 3350 17 G PO PACK: 17.0000 g | PACK | Freq: Every day | ORAL | Status: DC

## 2015-05-19 MED ORDER — EPHEDRINE SULFATE 50 MG/ML IJ SOLN
INTRAMUSCULAR | Status: DC | PRN
  Administered 2015-05-19: 5 mg via INTRAVENOUS
  Administered 2015-05-19: 10 mg via INTRAVENOUS
  Administered 2015-05-19: 5 mg via INTRAVENOUS

## 2015-05-19 MED ORDER — PROPOFOL 500 MG/50ML IV EMUL
INTRAVENOUS | Status: DC | PRN
  Administered 2015-05-19: 150 ug/kg/min via INTRAVENOUS

## 2015-05-19 MED ORDER — PROPOFOL 10 MG/ML IV BOLUS
INTRAVENOUS | Status: DC | PRN
Start: 2015-05-19 — End: 2015-05-19
  Administered 2015-05-19: 50 mg via INTRAVENOUS

## 2015-05-19 NOTE — Op Note (Signed)
[facility] Gastroenterology Patient Name: Jeffrey Gallegos Procedure Date: 05/19/2015 1:27 PM MRN: [id number] Account #: [id number] Date of Birth: December 25, 1934 Admit Type: Inpatient Age: 80 Room: [facility] ENDO ROOM 2 Gender: Male Note Status: Finalized Procedure:         Colonoscopy Indications:       Iron deficiency anemia Patient Profile:   This is a 80 year old male. Providers:         Gerrit Heck. Rayann Heman, MD Referring MD:      Kirstie Peri. Caryn Section, MD (Referring MD) Medicines:         Propofol per Anesthesia Complications:     No immediate complications. Procedure:         Pre-Anesthesia Assessment:                    - Prior to the procedure, a History and Physical was                     performed, and patient medications, allergies and                     sensitivities were reviewed. The patient's tolerance of                     previous anesthesia was reviewed.                    After obtaining informed consent, the colonoscope was                     passed under direct vision. Throughout the procedure, the                     patient's blood pressure, pulse, and oxygen saturations                     were monitored continuously. The Colonoscope was                     introduced through the anus and advanced to the the                     terminal ileum. The colonoscopy was performed without                     difficulty. The patient tolerated the procedure well. The                     quality of the bowel preparation was fair. Findings:      The perianal exam findings include non-thrombosed external hemorrhoids.      The digital rectal exam findings include anal stricture. Pertinent       negatives include no anal lesion or abnormality was detected.      A 6 mm polyp was found in the sigmoid colon. The polyp was sessile. The       polyp was removed with a cold snare. Resection and retrieval were       complete.      A 3 mm polyp was found in the transverse  colon. The polyp was sessile.       The polyp was removed with a jumbo cold forceps. Resection and retrieval       were complete.      A single (solitary) eight mm ulcer was found at the ileocecal valve.  No       bleeding was present. Biopsies were taken with a cold forceps for       histology.      The terminal ileum appeared normal.      A diffuse area of mild melanosis was found in the entire colon. Impression:        - Non-thrombosed external hemorrhoids found on perianal                     exam.                    - Anal stricture found on digital rectal exam.                    - One 6 mm polyp in the sigmoid colon. Resected and                     retrieved.                    - One 3 mm polyp in the transverse colon. Resected and                     retrieved.                    - A single (solitary) ulcer at the ileocecal valve.                     Biopsied.                    - The examined portion of the ileum was normal.                    - Melanosis in the colon. Recommendation:    - Observe patient in GI recovery unit.                    - Resume regular diet.                    - Continue present medications.                    - Await pathology results.                    - Refer to a surgeon at appointment to be scheduled for                     anal stricture.                    - The findings and recommendations were discussed with the                     patient.                    - The findings and recommendations were discussed with the                     patient's family. Procedure Code(s): --- Professional ---                    [phone], Colonoscopy, flexible; with removal of tumor(s),  polyp(s), or other lesion(s) by snare technique                    45380, 59, Colonoscopy, flexible; with biopsy, single or                     multiple CPT copyright 2014 American Medical Association. All rights reserved. The codes documented in this  report are preliminary and upon coder review may  be revised to meet current compliance requirements. Mellody Life, MD 05/19/2015 2:47:59 PM Number of Addenda: 0 Note Initiated On: 05/19/2015 1:27 PM Scope Withdrawal Time: 0 hours 23 minutes 54 seconds  Total Procedure Duration: 0 hours 37 minutes 51 seconds       [facility]

## 2015-05-19 NOTE — Telephone Encounter (Signed)
Pt is scheduled for hospital f/u on 06/02/15. Pt is being discharged from hospital today 05/19/15 and was treated for congested heart failure. Thanks TNP

## 2015-05-19 NOTE — Telephone Encounter (Signed)
Wife called to say Jeffrey Gallegos is in the hospital but hopes to return to [facility].

## 2015-05-19 NOTE — H&P (View-Only) (Signed)
GI Inpatient Follow-up Note  Patient Identification: Jeffrey Gallegos is a 80 y.o. male with IDA, constipation, possible anal stricture  Subjective:  Less constipated today. Tolerating clear liquids.  No melean, no rectal bleeding, no abd pain.  No f/c.   Scheduled Inpatient Medications:  . amiodarone  200 mg Oral Daily  . antiseptic oral rinse  7 mL Mouth Rinse q12n4p  . atorvastatin  20 mg Oral q1800  . carvedilol  3.125 mg Oral BID  . chlorhexidine  15 mL Mouth Rinse BID  . furosemide  20 mg Intravenous Once  . furosemide  20 mg Intravenous Once  . lactulose  30 g Oral BID  . levothyroxine  125 mcg Oral QAC breakfast  . pantoprazole  40 mg Oral BID  . sodium chloride  3 mL Intravenous Q12H  . torsemide  10 mg Oral BID    Continuous Inpatient Infusions:     PRN Inpatient Medications:  acetaminophen **OR** acetaminophen, diphenhydrAMINE, ondansetron **OR** ondansetron (ZOFRAN) IV    Physical Examination: BP 102/58 mmHg  Pulse 59  Temp(Src) 97.9 F (36.6 C) (Oral)  Resp 18  Ht 5\' 8"  (1.727 m)  Wt 70.988 kg (156 lb 8 oz)  BMI 23.80 kg/m2  SpO2 93% Gen: NAD, alert and oriented x 4 Neck: supple, no JVD or thyromegaly Chest: CTA bilaterally, no wheezes, crackles, or other adventitious sounds CV: RRR, no m/g/c/r Abd: + distended, NT +BS in all four quadrants; no HSM, guarding, ridigity, or rebound tenderness Ext: no edema, well perfused with 2+ pulses, Skin: no rash or lesions noted Lymph: no LAD  Data: Lab Results  Component Value Date   WBC 10.3 05/16/2015   HGB 8.5* 05/18/2015   HCT 27.1* 05/16/2015   MCV 79.8* 05/16/2015   PLT 123* 05/16/2015    Recent Labs Lab 05/16/15 0731 05/17/15 0523 05/18/15 0509  HGB 8.7* 8.7* 8.5*   Lab Results  Component Value Date   NA 133* 05/18/2015   K 3.3* 05/18/2015   CL 100* 05/18/2015   CO2 24 05/18/2015   BUN 45* 05/18/2015   CREATININE 1.77* 05/18/2015   GLU 103 08/26/2014   Lab Results  Component Value  Date   ALT 24 05/15/2015   AST 39 05/15/2015   ALKPHOS 148* 05/15/2015   BILITOT 1.5* 05/15/2015   No results for input(s): APTT, INR, PTT in the last 168 hours.   Assessment/Plan: Mr. Jeffrey Gallegos is a 80 y.o. male with IDA, constipation, possible anal stricture   Recommendations: - EGD / Colon tomorrow, will investigate possible anal stricture at same time.  - miralax 255 gr this evening and 255 gr in am  - npo after 8 am   Please call with questions or concerns.  Jeffrey Gallegos, Grace Blight, MD

## 2015-05-19 NOTE — Anesthesia Preprocedure Evaluation (Signed)
Anesthesia Evaluation  Patient identified by MRN, date of birth, ID band Patient awake    Reviewed: Allergy & Precautions, H&P , NPO status , Patient's Chart, lab work & pertinent test results, reviewed documented beta blocker date and time   History of Anesthesia Complications Negative for: history of anesthetic complications  Airway Mallampati: II  TM Distance: >3 FB Neck ROM: full    Dental  (+) Upper Dentures, Chipped, Poor Dentition   Pulmonary shortness of breath and with exertion, sleep apnea , neg COPD, neg recent URI, former smoker,    Pulmonary exam normal breath sounds clear to auscultation       Cardiovascular Exercise Tolerance: Good hypertension, On Medications and On Home Beta Blockers + angina with exertion + CAD, + Past MI (in 2011), + Cardiac Stents, + CABG (4 vessels in 2011) and +CHF  Normal cardiovascular exam+ dysrhythmias (s/p ablation) Atrial Fibrillation + pacemaker + Valvular Problems/Murmurs  Rhythm:regular Rate:Normal     Neuro/Psych negative neurological ROS  negative psych ROS   GI/Hepatic Neg liver ROS, GERD  Medicated and Controlled,  Endo/Other  diabetesHypothyroidism   Renal/GU CRFRenal disease  negative genitourinary   Musculoskeletal   Abdominal   Peds  Hematology  (+) Blood dyscrasia, anemia ,   Anesthesia Other Findings Past Medical History:   History of MI (myocardial infarction)           2011         History of mumps                                               Comment:Childhood   Coronary artery disease                                      Hypertension                                                 Sleep apnea                                                  Thyroid disease                                                Comment:synthroid   Anemia                                                       Vitamin D deficiency                                         Chronic  systolic CHF (congestive heart failure*  HLD (hyperlipidemia)                                         GERD (gastroesophageal reflux disease)                       A-fib (HCC)                                                    Comment:on eliquis   BPH (benign prostatic hyperplasia)                           CKD (chronic kidney disease), stage III                      Reproductive/Obstetrics negative OB ROS                             Anesthesia Physical Anesthesia Plan  ASA: IV  Anesthesia Plan: General   Post-op Pain Management:    Induction:   Airway Management Planned:   Additional Equipment:   Intra-op Plan:   Post-operative Plan:   Informed Consent: I have reviewed the patients History and Physical, chart, labs and discussed the procedure including the risks, benefits and alternatives for the proposed anesthesia with the patient or authorized representative who has indicated his/her understanding and acceptance.   Dental Advisory Given  Plan Discussed with: Anesthesiologist, CRNA and Surgeon  Anesthesia Plan Comments:         Anesthesia Quick Evaluation

## 2015-05-19 NOTE — Care Management Important Message (Signed)
Important Message  Patient Details  Name: Jeffrey Gallegos MRN: [id number] Date of Birth: 08-10-1934   Medicare Important Message Given:  Yes-third notification given    [name] 05/19/2015, 9:30 AM

## 2015-05-19 NOTE — Progress Notes (Signed)
Removed telemetry and removed iv.  One new rx.  Patient repeated back discharge information.  Post procedure from GI given.  Family at bedside.  Patient escorted out of hospital via wheelchair by volunteers.

## 2015-05-19 NOTE — Care Management (Signed)
Informed that patient for EGD and colonoscopy today and anticipate discharge after procedures if stable.  Primary nursing  reports that there are no dischrge needs.  Patient has been ambulating on unit.  Discussed getting room air exertional sats prior to discharge and to inform CM during progression.

## 2015-05-19 NOTE — Transfer of Care (Signed)
Immediate Anesthesia Transfer of Care Note  Patient: Jeffrey Gallegos  Procedure(s) Performed: Procedure(s): ESOPHAGOGASTRODUODENOSCOPY (EGD) WITH PROPOFOL (N/A) COLONOSCOPY WITH PROPOFOL (N/A)  Patient Location: PACU  Anesthesia Type:General  Level of Consciousness: awake  Airway & Oxygen Therapy: Patient Spontanous Breathing and Patient connected to nasal cannula oxygen  Post-op Assessment: Report given to RN and Post -op Vital signs reviewed and stable  Post vital signs: stable  Last Vitals:  Filed Vitals:   05/19/15 1451  BP: 105/68  Pulse: 59  Temp: 36 C  Resp: 16    Complications: No apparent anesthesia complications

## 2015-05-19 NOTE — Op Note (Signed)
[facility] Gastroenterology Patient Name: Jeffrey Gallegos Procedure Date: 05/19/2015 1:27 PM MRN: [id number] Account #: [id number] Date of Birth: October 31, 1934 Admit Type: Inpatient Age: 80 Room: [facility] ENDO ROOM 2 Gender: Male Note Status: Finalized Procedure:         Upper GI endoscopy Indications:       Iron deficiency anemia, Dysphagia Providers:         Gerrit Heck. Rayann Heman, MD Referring MD:      Kirstie Peri. Caryn Section, MD (Referring MD) Medicines:         Propofol per Anesthesia Complications:     No immediate complications. Procedure:         Pre-Anesthesia Assessment:                    - Prior to the procedure, a History and Physical was                     performed, and patient medications, allergies and                     sensitivities were reviewed. The patient's tolerance of                     previous anesthesia was reviewed.                    After obtaining informed consent, the endoscope was passed                     under direct vision. Throughout the procedure, the                     patient's blood pressure, pulse, and oxygen saturations                     were monitored continuously. The Endoscope was introduced                     through the mouth, and advanced to the second part of                     duodenum. The upper GI endoscopy was accomplished without                     difficulty. The patient tolerated the procedure well. Findings:      LA Grade B (one or more mucosal breaks greater than 5 mm, not extending       between the tops of two mucosal folds) esophagitis was found at the       gastroesophageal junction. Biopsies were taken with a cold forceps for       histology.      White nummular lesions were noted in the upper third of the esophagus       and in the middle third of the esophagus. Brushings were obtained in the       middle third of the esophagus for KOH prep.      One non-bleeding cratered gastric ulcer with no stigmata of  bleeding was       found in the gastric antrum. The lesion was 13 mm in largest dimension.       Biopsies were taken with a cold forceps for histology.      Diffuse atrophic mucosa was found in the entire examined stomach.  Biopsies were taken with a cold forceps for histology from antrum and       body.      The examined duodenum was normal. Impression:        - LA Grade B reflux esophagitis at GEJ. Biopsied.                    - White nummular lesions in esophageal mucosa. Brushed.                    - Gastric ulcer with clean base. Biopsied.                    - Gastric mucosal atrophy. Biopsied.                    - Normal examined duodenum. Recommendation:    - Perform a colonoscopy today.                    - Use Prilosec (omeprazole) 40 mg PO BID.                    - Await pathology results.                    - Repeat the upper endoscopy in 8 weeks to check healing.                    - The findings and recommendations were discussed with the                     patient.                    - The findings and recommendations were discussed with the                     patient's family. Procedure Code(s): --- Professional ---                    [phone], Esophagogastroduodenoscopy, flexible, transoral;                     with biopsy, single or multiple CPT copyright 2014 American Medical Association. All rights reserved. The codes documented in this report are preliminary and upon coder review may  be revised to meet current compliance requirements. Mellody Life, MD 05/19/2015 1:59:19 PM This report has been signed electronically. Number of Addenda: 0 Note Initiated On: 05/19/2015 1:27 PM      [facility]

## 2015-05-19 NOTE — Interval H&P Note (Signed)
History and Physical Interval Note:  05/19/2015 1:17 PM  Jeffrey Gallegos  has presented today for surgery, with the diagnosis of IDA  The various methods of treatment have been discussed with the patient and family. After consideration of risks, benefits and other options for treatment, the patient has consented to  Procedure(s): ESOPHAGOGASTRODUODENOSCOPY (EGD) WITH PROPOFOL (N/A) COLONOSCOPY WITH PROPOFOL (N/A) as a surgical intervention .  The patient's history has been reviewed, patient examined, no change in status, stable for surgery.  I have reviewed the patient's chart and labs.  Questions were answered to the patient's satisfaction.     REIN, MATTHEW GORDON

## 2015-05-20 MED ORDER — PANTOPRAZOLE SODIUM 40 MG PO TBEC: 40.0000 mg | DELAYED_RELEASE_TABLET | Freq: Two times a day (BID) | ORAL | Status: AC

## 2015-05-20 NOTE — Discharge Summary (Signed)
Jeffrey Gallegos, 80 y.o., DOB 12-02-1934, MRN [id number]. Admission date: 05/15/2015 Discharge Date 05/20/2015 Primary MD [name], MD Admitting Physician Lance Coon, MD  Admission Diagnosis  Hyponatremia [E87.1] Chronic renal failure, stage 3 (moderate) [N18.3] Constipation, unspecified constipation type [K59.00] Anemia, unspecified anemia type [D64.9]  Discharge Diagnosis   Principal Problem:   Acute on chronic systolic CHF (congestive heart failure) (HCC)    BPH (benign prostatic hyperplasia)   Adult hypothyroidism   Chronic kidney disease   Benign essential HTN   Atrial fibrillation, chronic (HCC)   CAD in native artery   Constipation   Anemia   Hyponatremia  anal stricture esophagitis        Hospital Course Jeffrey Gallegos is a 80 y.o. male who presents with severe constipation. Patient states that he was scheduled to have colonoscopy tomorrow as part of his anemia workup. However, he was not able to complete appropriate bowel prep due to his severe constipation. Patient was also having some respiratory difficulties. Due to the symptoms she was admitted to the hospital. In terms of his respiratory distress it was due to acute systolic CHF he was treated with IV Lasix with resolution of his symptoms. Patient also was having severe constipation and could not do a colonoscopy prep. He had multiple and Amoss with resolution of his constipation. Patient also has had anemia as outpatient and was transfused. He also underwent a EGD and a colonoscopy. EGD revealed esophagitis. And a clean base gastric ulcer. On the colonoscopy was noted to have a anal stricture for which he was referred to surgery. Patient at this point is doing much better and is denying any complaints. He is stable for discharge.              Consults  GI  Significant Tests:  See full reports for all details    Ct Abdomen Pelvis Wo Contrast  05/15/2015  CLINICAL DATA:  Abdominal pain with  constipation for the past 1-2 weeks. Decreased urinary output with increased fluid retention. EXAM: CT ABDOMEN AND PELVIS WITHOUT CONTRAST TECHNIQUE: Multidetector CT imaging of the abdomen and pelvis was performed following the standard protocol without IV contrast. COMPARISON:  None. FINDINGS: The lack of intravenous contrast limits the ability to evaluate solid abdominal organs. There is mild nodularity of the hepatic contour. There are several layering radiopaque gallstones within otherwise normal-appearing gallbladder. There is a trace amount of perihepatic ascites. Normal noncontrast appearance of the bilateral kidneys. No renal stones. No urinary obstruction or perinephric stranding. Normal noncontrast appearance of bilateral adrenal glands, pancreas and spleen. A small amount of fluid is noted within the left upper abdominal quadrant. Ingested enteric contrast extends to the level of the cecum. Large colonic stool burden without definite evidence of enteric obstruction. No pneumoperitoneum, pneumatosis or portal venous gas. Large amount of eccentric irregular calcified atherosclerotic plaque within a normal caliber abdominal aorta. No definitive bulky retroperitoneal, mesenteric, pelvic or inguinal lymphadenopathy on this noncontrast examination. There is a small amount of fluid seen within the pelvic cul-de-sac. Normal noncontrast appearance of the urinary bladder given degree distention. Limited visualization of the lower thorax demonstrates cardiomegaly. Pacer leads are seen within the right atrium, ventricle and coronary sinus. There is diffuse decreased attenuation of the intra cardiac blood pool suggestive of anemia. Calcifications within the aortic valve leaflets. No pericardial effusion. Trace right-sided pleural effusion. Minimal dependent subpleural ground-glass atelectasis. No acute or aggressive osseous abnormalities. Mild-to-moderate multilevel lumbar spine DDD, worse at L4-L5 with disc space  height loss, endplate irregularity and sclerosis. Mild diffuse body wall anasarca. Small bilateral mesenteric fat containing inguinal hernias. IMPRESSION: 1. Mild nodularity hepatic contour suggestive of mild cirrhotic change. 2. Small volume intra-abdominal ascites. 3. Cholelithiasis without definite evidence of cholecystitis on this noncontrast examination. 4. No evidence of nephrolithiasis or urinary obstruction. 5. Large colonic stool burden without evidence of enteric obstruction. 6. Cardiomegaly. 7. Suspected anemia. Electronically Signed   By: Sandi Mariscal M.D.   On: 05/15/2015 17:16   Dg Abd Acute W/chest  05/15/2015  CLINICAL DATA:  1-2 week history of constipation decreased urinary output with increased fluid retention and waking. EXAM: DG ABDOMEN ACUTE W/ 1V CHEST COMPARISON:  Chest x-ray 10/21/2009 FINDINGS: The upright chest x-ray demonstrates mild stable cardiac enlargement. There are surgical changes from triple bypass surgery and there are right atrial, right ventricular and coronary sinus pacer wires in good position without complicating features. The lungs are clear. Possible small right effusion. No pulmonary edema. Moderate stool throughout the colon and down into the rectum suggesting constipation. No findings for small bowel obstruction or free air. The soft tissue shadows are maintained the bony structures are intact. Mild scoliosis. IMPRESSION: Mild stable cardiac enlargement but no acute pulmonary findings. Moderate to large amount of stool throughout the colon consistent with constipation. Electronically Signed   By: Marijo Sanes M.D.   On: 05/15/2015 15:29       Today   Subjective:   Jeffrey Gallegos feels well anxious to go home  Objective:   Blood pressure 100/68, pulse 61, temperature 98.3 F (36.8 C), temperature source Tympanic, resp. rate 14, height 5\' 8"  (1.727 m), weight 69.128 kg (152 lb 6.4 oz), SpO2 99 %.  .  Intake/Output Summary (Last 24 hours) at 05/20/15  1111 Last data filed at 05/19/15 1655  Gross per 24 hour  Intake    400 ml  Output    400 ml  Net      0 ml    Exam VITAL SIGNS: Blood pressure 100/68, pulse 61, temperature 98.3 F (36.8 C), temperature source Tympanic, resp. rate 14, height 5\' 8"  (1.727 m), weight 69.128 kg (152 lb 6.4 oz), SpO2 99 %.  GENERAL:  80 y.o.-year-old patient lying in the bed with no acute distress.  EYES: Pupils equal, round, reactive to light and accommodation. No scleral icterus. Extraocular muscles intact.  HEENT: Head atraumatic, normocephalic. Oropharynx and nasopharynx clear.  NECK:  Supple, no jugular venous distention. No thyroid enlargement, no tenderness.  LUNGS: Normal breath sounds bilaterally, no wheezing, rales,rhonchi or crepitation. No use of accessory muscles of respiration.  CARDIOVASCULAR: S1, S2 normal. No murmurs, rubs, or gallops.  ABDOMEN: Soft, nontender, nondistended. Bowel sounds present. No organomegaly or mass.  EXTREMITIES: No pedal edema, cyanosis, or clubbing.  NEUROLOGIC: Cranial nerves II through XII are intact. Muscle strength 5/5 in all extremities. Sensation intact. Gait not checked.  PSYCHIATRIC: The patient is alert and oriented x 3.  SKIN: No obvious rash, lesion, or ulcer.   Data Review     CBC w Diff: Lab Results  Component Value Date   WBC 10.3 05/16/2015   HGB 8.5* 05/18/2015   HCT 27.1* 05/16/2015   PLT 123* 05/16/2015   CMP: Lab Results  Component Value Date   NA 133* 05/19/2015   NA 130* 04/03/2015   K 3.8 05/19/2015   CL 99* 05/19/2015   CO2 25 05/19/2015   BUN 35* 05/19/2015   BUN 30* 04/03/2015   CREATININE 1.59* 05/19/2015  CREATININE 1.3 08/26/2014   GLU 103 08/26/2014   PROT 6.6 05/15/2015   ALBUMIN 3.2* 05/15/2015   ALBUMIN 3.6 04/03/2015   BILITOT 1.5* 05/15/2015   ALKPHOS 148* 05/15/2015   AST 39 05/15/2015   ALT 24 05/15/2015  .  Micro Results Recent Results (from the past 240 hour(s))  KOH prep     Status: None    Collection Time: 05/19/15  2:14 PM  Result Value Ref Range Status   Specimen Description BRONCH BRUSH TIP  Final   Special Requests NONE  Final   KOH Prep NO YEAST OR FUNGAL ELEMENTS SEEN  Final   Report Status 05/19/2015 FINAL  Final           Follow-up Information    Follow up with Alisa Graff, FNP. Go on 05/30/2015.   Specialty:  Family Medicine   Why:  at 9:00am , to the [facility] information:   [place] [address] [address] [phone]       Follow up with [name], MD. Go on 06/02/2015.   Specialty:  Family Medicine   Why:  check cbc in 2 wks, , App time at 10:00 am   Contact information:   [address] [place] [address] [phone]       Follow up with Josefine Class, MD. Go on 06/04/2015.   Specialty:  Gastroenterology   Why:  Hospital follow up, App time at 3:15 pm   Contact information:   [address] [place] [address] [phone]       Discharge Medications     Medication List    STOP taking these medications        naproxen sodium 220 MG tablet  Commonly known as:  ANAPROX      TAKE these medications        amiodarone 200 MG tablet  Commonly known as:  PACERONE  Take 200 mg by mouth daily.     atorvastatin 80 MG tablet  Commonly known as:  LIPITOR  TAKE ONE (1) TABLET BY MOUTH EVERY DAY     carvedilol 3.125 MG tablet  Commonly known as:  COREG  Take 1 tablet by mouth 2 (two) times daily.     ELIQUIS 5 MG Tabs tablet  Generic drug:  apixaban  Take 5 mg by mouth 2 (two) times daily.     levothyroxine 112 MCG tablet  Commonly known as:  SYNTHROID, LEVOTHROID  Take 1 tablet (112 mcg total) by mouth daily before breakfast.     nitroGLYCERIN 0.4 MG SL tablet  Commonly known as:  NITROSTAT  Place 0.4 mg under the tongue every 5 (five) minutes as needed for chest pain.     pantoprazole 40 MG tablet  Commonly known  as:  PROTONIX  TAKE ONE (1) TABLET BY MOUTH EVERY DAY     polyethylene glycol packet  Commonly known as:  MIRALAX  Take 17 g by mouth daily.     Saw Palmetto (Serenoa repens) 450 MG Caps  Take 3 tablets by mouth 2 (two) times daily.     spironolactone 25 MG tablet  Commonly known as:  ALDACTONE  Take 25 mg by mouth daily.     tadalafil 5 MG tablet  Commonly known as:  CIALIS  Take 1 tablet by mouth daily.     torsemide 10 MG tablet  Commonly known as:  DEMADEX  Take by mouth.  Takes 20 in the morning and 10 in the evening     TYLENOL PO  Take by mouth as needed.           Total Time in preparing paper work, data evaluation and todays exam - 35 minutes  Dustin Flock M.D on 05/20/2015 at 11:11 AM  [place]  [phone]

## 2015-05-20 NOTE — Anesthesia Postprocedure Evaluation (Signed)
  Anesthesia Post-op Note  Patient: Jeffrey Gallegos  Procedure(s) Performed: Procedure(s): ESOPHAGOGASTRODUODENOSCOPY (EGD) WITH PROPOFOL (N/A) COLONOSCOPY WITH PROPOFOL (N/A)  Anesthesia type:General  Patient location: PACU  Post pain: Pain level controlled  Post assessment: Post-op Vital signs reviewed, Patient's Cardiovascular Status Stable, Respiratory Function Stable, Patent Airway and No signs of Nausea or vomiting  Post vital signs: Reviewed and stable  Last Vitals:  Filed Vitals:   05/19/15 1623  BP: 100/68  Pulse: 61  Temp: 36.8 C  Resp:     Level of consciousness: awake, alert  and patient cooperative  Complications: No apparent anesthesia complications

## 2015-05-20 NOTE — Progress Notes (Signed)
Call prescription to patient for protonix bid for 30 days as per gi instruction

## 2015-05-22 ENCOUNTER — Inpatient Hospital Stay
Admission: EM | Admit: 2015-05-22 | Discharge: 2015-05-26 | DRG: 291 | Disposition: A | Discharge status: Home Health | Payer: Medicare Other | Attending: Internal Medicine | Admitting: Internal Medicine

## 2015-05-22 ENCOUNTER — Emergency Department: Payer: Medicare Other

## 2015-05-22 ENCOUNTER — Encounter: Payer: Self-pay | Admitting: *Deleted

## 2015-05-22 ENCOUNTER — Encounter: Payer: Self-pay | Admitting: Gastroenterology

## 2015-05-22 DIAGNOSIS — E039 Hypothyroidism, unspecified: Secondary | ICD-10-CM | POA: Diagnosis present

## 2015-05-22 DIAGNOSIS — E1122 Type 2 diabetes mellitus with diabetic chronic kidney disease: Secondary | ICD-10-CM | POA: Diagnosis present

## 2015-05-22 DIAGNOSIS — K219 Gastro-esophageal reflux disease without esophagitis: Secondary | ICD-10-CM | POA: Diagnosis present

## 2015-05-22 DIAGNOSIS — N183 Chronic kidney disease, stage 3 (moderate): Secondary | ICD-10-CM | POA: Diagnosis present

## 2015-05-22 DIAGNOSIS — Z79899 Other long term (current) drug therapy: Secondary | ICD-10-CM | POA: Diagnosis not present

## 2015-05-22 DIAGNOSIS — I480 Paroxysmal atrial fibrillation: Secondary | ICD-10-CM | POA: Diagnosis present

## 2015-05-22 DIAGNOSIS — I5022 Chronic systolic (congestive) heart failure: Secondary | ICD-10-CM | POA: Diagnosis present

## 2015-05-22 DIAGNOSIS — M5136 Other intervertebral disc degeneration, lumbar region: Secondary | ICD-10-CM | POA: Diagnosis present

## 2015-05-22 DIAGNOSIS — I071 Rheumatic tricuspid insufficiency: Secondary | ICD-10-CM | POA: Diagnosis present

## 2015-05-22 DIAGNOSIS — T82898A Other specified complication of vascular prosthetic devices, implants and grafts, initial encounter: Secondary | ICD-10-CM | POA: Diagnosis present

## 2015-05-22 DIAGNOSIS — N3090 Cystitis, unspecified without hematuria: Secondary | ICD-10-CM | POA: Diagnosis present

## 2015-05-22 DIAGNOSIS — D696 Thrombocytopenia, unspecified: Secondary | ICD-10-CM | POA: Diagnosis present

## 2015-05-22 DIAGNOSIS — Z950 Presence of cardiac pacemaker: Secondary | ICD-10-CM | POA: Diagnosis not present

## 2015-05-22 DIAGNOSIS — Z8619 Personal history of other infectious and parasitic diseases: Secondary | ICD-10-CM

## 2015-05-22 DIAGNOSIS — H00019 Hordeolum externum unspecified eye, unspecified eyelid: Secondary | ICD-10-CM | POA: Diagnosis present

## 2015-05-22 DIAGNOSIS — I255 Ischemic cardiomyopathy: Secondary | ICD-10-CM | POA: Diagnosis present

## 2015-05-22 DIAGNOSIS — N179 Acute kidney failure, unspecified: Secondary | ICD-10-CM | POA: Diagnosis present

## 2015-05-22 DIAGNOSIS — I252 Old myocardial infarction: Secondary | ICD-10-CM

## 2015-05-22 DIAGNOSIS — E559 Vitamin D deficiency, unspecified: Secondary | ICD-10-CM | POA: Diagnosis present

## 2015-05-22 DIAGNOSIS — F17210 Nicotine dependence, cigarettes, uncomplicated: Secondary | ICD-10-CM | POA: Diagnosis present

## 2015-05-22 DIAGNOSIS — Y832 Surgical operation with anastomosis, bypass or graft as the cause of abnormal reaction of the patient, or of later complication, without mention of misadventure at the time of the procedure: Secondary | ICD-10-CM | POA: Diagnosis present

## 2015-05-22 DIAGNOSIS — I509 Heart failure, unspecified: Secondary | ICD-10-CM | POA: Diagnosis present

## 2015-05-22 DIAGNOSIS — E785 Hyperlipidemia, unspecified: Secondary | ICD-10-CM | POA: Diagnosis present

## 2015-05-22 DIAGNOSIS — N400 Enlarged prostate without lower urinary tract symptoms: Secondary | ICD-10-CM | POA: Diagnosis present

## 2015-05-22 DIAGNOSIS — I2510 Atherosclerotic heart disease of native coronary artery without angina pectoris: Secondary | ICD-10-CM | POA: Diagnosis present

## 2015-05-22 DIAGNOSIS — G4730 Sleep apnea, unspecified: Secondary | ICD-10-CM | POA: Diagnosis present

## 2015-05-22 DIAGNOSIS — I700 Atherosclerosis of aorta: Secondary | ICD-10-CM | POA: Diagnosis present

## 2015-05-22 DIAGNOSIS — M419 Scoliosis, unspecified: Secondary | ICD-10-CM | POA: Diagnosis present

## 2015-05-22 DIAGNOSIS — I482 Chronic atrial fibrillation: Secondary | ICD-10-CM | POA: Diagnosis present

## 2015-05-22 DIAGNOSIS — I130 Hypertensive heart and chronic kidney disease with heart failure and stage 1 through stage 4 chronic kidney disease, or unspecified chronic kidney disease: Principal | ICD-10-CM | POA: Diagnosis present

## 2015-05-22 DIAGNOSIS — I5023 Acute on chronic systolic (congestive) heart failure: Secondary | ICD-10-CM | POA: Diagnosis present

## 2015-05-22 DIAGNOSIS — E871 Hypo-osmolality and hyponatremia: Secondary | ICD-10-CM | POA: Diagnosis present

## 2015-05-22 DIAGNOSIS — R809 Proteinuria, unspecified: Secondary | ICD-10-CM | POA: Diagnosis present

## 2015-05-22 DIAGNOSIS — Z7901 Long term (current) use of anticoagulants: Secondary | ICD-10-CM

## 2015-05-22 DIAGNOSIS — K5900 Constipation, unspecified: Secondary | ICD-10-CM | POA: Diagnosis present

## 2015-05-22 DIAGNOSIS — E079 Disorder of thyroid, unspecified: Secondary | ICD-10-CM | POA: Diagnosis present

## 2015-05-22 DIAGNOSIS — D631 Anemia in chronic kidney disease: Secondary | ICD-10-CM | POA: Diagnosis present

## 2015-05-22 LAB — URINALYSIS COMPLETE WITH MICROSCOPIC (ARMC ONLY)
Bilirubin Urine: NEGATIVE
Glucose, UA: NEGATIVE mg/dL
Ketones, ur: NEGATIVE mg/dL
NITRITE: NEGATIVE
PH: 6 (ref 5.0–8.0)
Protein, ur: NEGATIVE mg/dL
Specific Gravity, Urine: 1.006 (ref 1.005–1.030)
Squamous Epithelial / LPF: NONE SEEN

## 2015-05-22 LAB — CBC WITH DIFFERENTIAL/PLATELET
Basophils Absolute: 0 10*3/uL (ref 0–0.1)
Basophils Relative: 1 %
Eosinophils Absolute: 0 10*3/uL (ref 0–0.7)
Eosinophils Relative: 1 %
HEMATOCRIT: 29.1 % — AB (ref 40.0–52.0)
HEMOGLOBIN: 9.1 g/dL — AB (ref 13.0–18.0)
LYMPHS ABS: 1 10*3/uL (ref 1.0–3.6)
LYMPHS PCT: 15 %
MCH: 25.6 pg — AB (ref 26.0–34.0)
MCHC: 31.2 g/dL — ABNORMAL LOW (ref 32.0–36.0)
MCV: 82 fL (ref 80.0–100.0)
Monocytes Absolute: 0.6 10*3/uL (ref 0.2–1.0)
Monocytes Relative: 9 %
NEUTROS ABS: 5 10*3/uL (ref 1.4–6.5)
NEUTROS PCT: 74 %
Platelets: 104 10*3/uL — ABNORMAL LOW (ref 150–440)
RBC: 3.55 MIL/uL — ABNORMAL LOW (ref 4.40–5.90)
RDW: 24.3 % — ABNORMAL HIGH (ref 11.5–14.5)
WBC: 6.6 10*3/uL (ref 3.8–10.6)

## 2015-05-22 LAB — TROPONIN I: TROPONIN I: 0.13 ng/mL — AB (ref <0.031)

## 2015-05-22 LAB — COMPREHENSIVE METABOLIC PANEL
ALBUMIN: 2.8 g/dL — AB (ref 3.5–5.0)
ALK PHOS: 178 U/L — AB (ref 38–126)
ALT: 19 U/L (ref 17–63)
AST: 34 U/L (ref 15–41)
Anion gap: 6 (ref 5–15)
BILIRUBIN TOTAL: 1.4 mg/dL — AB (ref 0.3–1.2)
BUN: 42 mg/dL — ABNORMAL HIGH (ref 6–20)
CO2: 25 mmol/L (ref 22–32)
CREATININE: 2.27 mg/dL — AB (ref 0.61–1.24)
Calcium: 8.1 mg/dL — ABNORMAL LOW (ref 8.9–10.3)
Chloride: 96 mmol/L — ABNORMAL LOW (ref 101–111)
GFR calc Af Amer: 30 mL/min — ABNORMAL LOW (ref >60)
GFR, EST NON AFRICAN AMERICAN: 26 mL/min — AB (ref >60)
GLUCOSE: 130 mg/dL — AB (ref 65–99)
POTASSIUM: 4.6 mmol/L (ref 3.5–5.1)
Sodium: 127 mmol/L — ABNORMAL LOW (ref 135–145)
Total Protein: 6.3 g/dL — ABNORMAL LOW (ref 6.5–8.1)

## 2015-05-22 LAB — SURGICAL PATHOLOGY

## 2015-05-22 LAB — BRAIN NATRIURETIC PEPTIDE: B Natriuretic Peptide: 1504 pg/mL — ABNORMAL HIGH (ref 0.0–100.0)

## 2015-05-22 LAB — LIPASE, BLOOD: Lipase: 38 U/L (ref 11–51)

## 2015-05-22 LAB — TSH: TSH: 13.835 u[IU]/mL — ABNORMAL HIGH (ref 0.350–4.500)

## 2015-05-22 LAB — PROTIME-INR
INR: 2.36
Prothrombin Time: 25.9 seconds — ABNORMAL HIGH (ref 11.4–15.0)

## 2015-05-22 MED ORDER — PANTOPRAZOLE SODIUM 40 MG PO TBEC
40.0000 mg | DELAYED_RELEASE_TABLET | Freq: Two times a day (BID) | ORAL | Status: DC
  Administered 2015-05-22 – 2015-05-26 (×8): 40 mg via ORAL
  Filled 2015-05-22 (×8): qty 1

## 2015-05-22 MED ORDER — ACETAMINOPHEN 500 MG PO TABS: 500.0000 mg | ORAL_TABLET | Freq: Four times a day (QID) | ORAL | Status: DC | PRN

## 2015-05-22 MED ORDER — FUROSEMIDE 10 MG/ML IJ SOLN
40.0000 mg | Freq: Once | INTRAMUSCULAR | Status: AC
  Administered 2015-05-22: 40 mg via INTRAVENOUS
  Filled 2015-05-22: qty 4

## 2015-05-22 MED ORDER — NITROGLYCERIN 0.4 MG SL SUBL: 0.4000 mg | SUBLINGUAL_TABLET | SUBLINGUAL | Status: DC | PRN

## 2015-05-22 MED ORDER — LEVOTHYROXINE SODIUM 112 MCG PO TABS
112.0000 ug | ORAL_TABLET | Freq: Every day | ORAL | Status: DC
  Administered 2015-05-23 – 2015-05-26 (×4): 112 ug via ORAL
  Filled 2015-05-22 (×4): qty 1

## 2015-05-22 MED ORDER — SAW PALMETTO (SERENOA REPENS) 450 MG PO CAPS: 3.0000 | ORAL_CAPSULE | Freq: Two times a day (BID) | ORAL | Status: DC

## 2015-05-22 MED ORDER — ATORVASTATIN CALCIUM 20 MG PO TABS
20.0000 mg | ORAL_TABLET | Freq: Every day | ORAL | Status: DC
  Administered 2015-05-23 – 2015-05-25 (×3): 20 mg via ORAL
  Filled 2015-05-22 (×3): qty 1

## 2015-05-22 MED ORDER — TAMSULOSIN HCL 0.4 MG PO CAPS
0.4000 mg | ORAL_CAPSULE | Freq: Every day | ORAL | Status: DC
  Administered 2015-05-22 – 2015-05-26 (×5): 0.4 mg via ORAL
  Filled 2015-05-22 (×5): qty 1

## 2015-05-22 MED ORDER — IOHEXOL 240 MG/ML SOLN
25.0000 mL | INTRAMUSCULAR | Status: AC
  Administered 2015-05-22 (×2): 25 mL via ORAL

## 2015-05-22 MED ORDER — FUROSEMIDE 10 MG/ML IJ SOLN
10.0000 mg/h | INTRAMUSCULAR | Status: AC
  Administered 2015-05-22: 10 mg/h via INTRAVENOUS
  Filled 2015-05-22: qty 25

## 2015-05-22 MED ORDER — APIXABAN 5 MG PO TABS
5.0000 mg | ORAL_TABLET | Freq: Two times a day (BID) | ORAL | Status: DC
  Administered 2015-05-22 – 2015-05-26 (×8): 5 mg via ORAL
  Filled 2015-05-22 (×8): qty 1

## 2015-05-22 MED ORDER — CARVEDILOL 3.125 MG PO TABS
3.1250 mg | ORAL_TABLET | Freq: Two times a day (BID) | ORAL | Status: DC
  Administered 2015-05-22 – 2015-05-25 (×4): 3.125 mg via ORAL
  Filled 2015-05-22 (×6): qty 1

## 2015-05-22 MED ORDER — SPIRONOLACTONE 25 MG PO TABS
25.0000 mg | ORAL_TABLET | Freq: Every day | ORAL | Status: DC
  Administered 2015-05-23 – 2015-05-24 (×2): 25 mg via ORAL
  Filled 2015-05-22 (×2): qty 1

## 2015-05-22 MED ORDER — POLYETHYLENE GLYCOL 3350 17 G PO PACK
17.0000 g | PACK | Freq: Every day | ORAL | Status: DC
  Administered 2015-05-22 – 2015-05-23 (×2): 17 g via ORAL
  Filled 2015-05-22 (×2): qty 1

## 2015-05-22 MED ORDER — AMIODARONE HCL 200 MG PO TABS
200.0000 mg | ORAL_TABLET | Freq: Every day | ORAL | Status: DC
  Administered 2015-05-23 – 2015-05-26 (×4): 200 mg via ORAL
  Filled 2015-05-22 (×4): qty 1

## 2015-05-22 NOTE — ED Notes (Signed)
Patient and family updated on current plan of care. No further needs at this time.

## 2015-05-22 NOTE — H&P (Signed)
[facility] [place] NAME: Jeffrey Gallegos    MR#:  [id number]  DATE OF BIRTH:  July 18, 1935  DATE OF ADMISSION:  05/22/2015  PRIMARY CARE PHYSICIAN: [name], MD   REQUESTING/REFERRING PHYSICIAN: Schuyler Amor, MD  CHIEF COMPLAINT:   Chief Complaint  Patient presents with  . Constipation   HISTORY OF PRESENT ILLNESS:  Jeffrey Gallegos  is a 80 y.o. male with a known history of chronic systolic CHF, CAD s/p CABG, ICM, afib s/p ablation and pacer came to ED for weight gain of about 16 pounds over 2 weeks.  (Unintentional), had a home health nurse who recommended him to come to the emergency department.  He also had severe constipation reported in ED but had 2 large BM and feels better from that perspective. Patient actually has a 20 year history of constipation he states he's got a problem there his rectum which makes it hard for him to generate a bowel movement. Remarkably however, patient actually just had a colonoscopy after extensive bowel prep which did not show extensive constipation at that time. He and the family however convinced that he is constipated because he has not produced significant bowel movement since this weekend when he was doing his bowel prep for his colonoscopy. The patient states he's been complaining of a constipation for 20 years and no one ever takes him seriously and he feels that he needs an emergent surgery on his rectum for this. He has had no melena no bright red blood per rectum and he is not complaining of abdominal pain. In addition, patient states he is having trouble generating urine. He states he feels that he has a "blockage" and that although he is urinating is not a satisfying amount. No dysuria.   He also reports his lower extremity swelling is worse. he has baseline shortness of breath and that feels like it might be a little bit worse too. Denies orthopnea. Uses wedge pillow to sleep. He could walk  30 feet and getting short of breath so has to sit down. PAST MEDICAL HISTORY:   Past Medical History  Diagnosis Date  . History of MI (myocardial infarction) 2011  . History of mumps     Childhood  . Coronary artery disease   . Hypertension   . Sleep apnea   . Thyroid disease     synthroid  . Anemia   . Vitamin D deficiency   . Chronic systolic CHF (congestive heart failure) ([place])   . HLD (hyperlipidemia)   . GERD (gastroesophageal reflux disease)   . A-fib ([place])     on eliquis  . BPH (benign prostatic hyperplasia)   . CKD (chronic kidney disease), stage III    PAST SURGICAL HISTORY:   Past Surgical History  Procedure Laterality Date  . Coronary artery bypass graft  11/27/2009    [facility]V at [facility]  . Lasik Bilateral 2002  . Cyst excision Right     Foot  . Leg surgery      Right leg fracture  . Cardiac catheterization    . Electrophysiologic study N/A 01/13/2015    Procedure: CARDIOVERSION;  Surgeon: Teodoro Spray, MD;  Location: [facility] ORS;  Service: Cardiovascular;  Laterality: N/A;  . Esophagogastroduodenoscopy (egd) with propofol N/A 05/19/2015    Procedure: ESOPHAGOGASTRODUODENOSCOPY (EGD) WITH PROPOFOL;  Surgeon: Josefine Class, MD;  Location: [facility] ENDOSCOPY;  Service: Endoscopy;  Laterality: N/A;  . Colonoscopy with propofol N/A 05/19/2015  Procedure: COLONOSCOPY WITH PROPOFOL;  Surgeon: Josefine Class, MD;  Location: [facility] ENDOSCOPY;  Service: Endoscopy;  Laterality: N/A;   SOCIAL HISTORY:   Social History  Substance Use Topics  . Smoking status: Former Smoker    Types: Cigarettes    Quit date: 07/26/1968  . Smokeless tobacco: Current User    Types: Chew     Comment: Quit smoking in the 1970s  . Alcohol Use: 0.0 oz/week    0 Standard drinks or equivalent per week     Comment: Drinks Scotch   FAMILY HISTORY:   Family History  Problem Relation Age of Onset  . Cancer Mother     Gaspar Cola of death 71  . Heart attack Father     age of death 43  .  Heart failure Sister   . Seizures Sister   . Ulcers Sister   . Heart attack Maternal Uncle     age of death 28  . Heart attack Paternal Uncle     age of death 24  . Stroke Sister     age of death 58  . Heart attack Paternal Uncle     age of death 7   DRUG ALLERGIES:  No Known Allergies REVIEW OF SYSTEMS:   Review of Systems  Constitutional: Positive for malaise/fatigue. Negative for fever, weight loss and diaphoresis.       16 pounds weight gain  HENT: Negative for ear discharge, ear pain, hearing loss, nosebleeds, sore throat and tinnitus.   Eyes: Negative for blurred vision and pain.  Respiratory: Positive for shortness of breath. Negative for cough, hemoptysis and wheezing.   Cardiovascular: Positive for leg swelling. Negative for chest pain, palpitations and orthopnea.  Gastrointestinal: Negative for heartburn, nausea, vomiting, abdominal pain, diarrhea, constipation and blood in stool.  Genitourinary: Negative for dysuria, urgency and frequency.       Urinary retention  Musculoskeletal: Negative for myalgias and back pain.  Skin: Negative for itching and rash.  Neurological: Positive for weakness. Negative for dizziness, tingling, tremors, focal weakness, seizures and headaches.  Psychiatric/Behavioral: Negative for depression. The patient is not nervous/anxious.    MEDICATIONS AT HOME:   Prior to Admission medications   Medication Sig Start Date End Date Taking? Authorizing Provider  acetaminophen (TYLENOL) 500 MG tablet Take 500 mg by mouth every 6 (six) hours as needed for mild pain, moderate pain, fever or headache.   Yes Historical Provider, MD  amiodarone (PACERONE) 200 MG tablet Take 200 mg by mouth daily.    Yes Historical Provider, MD  atorvastatin (LIPITOR) 80 MG tablet TAKE ONE (1) TABLET BY MOUTH EVERY DAY Patient taking differently: TAKE ONE (0.5) TABLET BY MOUTH EVERY OTHER NIGHT 04/21/15  Yes Birdie Sons, MD  carvedilol (COREG) 3.125 MG tablet Take 1  tablet by mouth 2 (two) times daily. 02/05/15  Yes Historical Provider, MD  ELIQUIS 5 MG TABS tablet Take 5 mg by mouth 2 (two) times daily.  02/25/15  Yes Historical Provider, MD  levothyroxine (SYNTHROID, LEVOTHROID) 112 MCG tablet Take 1 tablet (112 mcg total) by mouth daily before breakfast. 04/04/15  Yes Birdie Sons, MD  nitroGLYCERIN (NITROSTAT) 0.4 MG SL tablet Place 0.4 mg under the tongue every 5 (five) minutes as needed for chest pain.   Yes Historical Provider, MD  pantoprazole (PROTONIX) 40 MG tablet Take 1 tablet (40 mg total) by mouth 2 (two) times daily. Patient taking differently: Take 40 mg by mouth daily.  05/20/15 06/20/15 Yes Dustin Flock, MD  polyethylene glycol (MIRALAX) packet Take 17 g by mouth daily. 05/19/15  Yes Dustin Flock, MD  Saw Palmetto, Serenoa repens, 450 MG CAPS Take 3 capsules by mouth 2 (two) times daily.    Yes Historical Provider, MD  spironolactone (ALDACTONE) 25 MG tablet Take 25 mg by mouth daily.  02/24/15  Yes Historical Provider, MD  tadalafil (CIALIS) 5 MG tablet Take 1 tablet by mouth daily. 08/24/12  Yes Historical Provider, MD  torsemide (DEMADEX) 10 MG tablet Take 10-20 mg by mouth See admin instructions. 2 tablets every morning and 1 tablet every evening   Yes Historical Provider, MD   VITAL SIGNS:  Blood pressure 109/74, pulse 60, temperature 97.5 F (36.4 C), temperature source Oral, resp. rate 20, height 5\' 8"  (1.727 m), weight 68.947 kg (152 lb), SpO2 94 %. PHYSICAL EXAMINATION:  Physical Exam  GENERAL:  80 y.o.-year-old patient lying in the bed with no acute distress.  EYES: Pupils equal, round, reactive to light and accommodation. No scleral icterus. Extraocular muscles intact.  HEENT: Head atraumatic, normocephalic. Oropharynx and nasopharynx clear.  NECK:  Supple, no jugular venous distention. No thyroid enlargement, no tenderness.  LUNGS: Normal breath sounds bilaterally, no wheezing, rales,rhonchi or crepitation. No use of accessory  muscles of respiration.  CARDIOVASCULAR: S1, S2 normal. No murmurs, rubs, or gallops.  ABDOMEN: Soft, nontender, nondistended. Bowel sounds present. No organomegaly or mass.  EXTREMITIES: 2-3 + pedal edema, cyanosis, or clubbing.  NEUROLOGIC: Cranial nerves II through XII are intact. Muscle strength 5/5 in all extremities. Sensation intact. Gait not checked.  PSYCHIATRIC: The patient is alert and oriented x 3.  SKIN: No obvious rash, lesion, or ulcer.  LABORATORY PANEL:   CBC  Recent Labs Lab 05/22/15 1335  WBC 6.6  HGB 9.1*  HCT 29.1*  PLT 104*   ------------------------------------------------------------------------------------------------------------------  Chemistries   Recent Labs Lab 05/22/15 1335  NA 127*  K 4.6  CL 96*  CO2 25  GLUCOSE 130*  BUN 42*  CREATININE 2.27*  CALCIUM 8.1*  AST 34  ALT 19  ALKPHOS 178*  BILITOT 1.4*   ------------------------------------------------------------------------------------------------------------------  Cardiac Enzymes  Recent Labs Lab 05/22/15 1335  TROPONINI 0.13*   ------------------------------------------------------------------------------------------------------------------  RADIOLOGY:  Ct Abdomen Pelvis Wo Contrast  05/22/2015  CLINICAL DATA:  Complains of constipation. EXAM: CT ABDOMEN AND PELVIS WITHOUT CONTRAST TECHNIQUE: Multidetector CT imaging of the abdomen and pelvis was performed following the standard protocol without IV contrast. COMPARISON:  05/15/2015 FINDINGS: Lower chest: Again noted are biventricular pacer leads. Again noted is a small right pleural fluid. There is a new small left pleural effusion. Small amount of atelectasis at the right lung base. Again noted is enlargement of the heart. Hepatobiliary: There is increased perihepatic ascites. No gross abnormality to the liver but difficult to exclude mild cirrhosis. There are high-density stones in the gallbladder with small amount of fluid  around the gallbladder. The gallbladder is not distended. Pancreas: Normal appearance of the pancreas without duct dilatation or inflammation. Spleen: Again noted is perisplenic ascites.  No enlargement. Adrenals/Urinary Tract: Stable fullness of the left adrenal gland. Normal appearance the right adrenal gland. Normal appearance of both kidneys without stones or hydronephrosis. Again noted is mild bladder wall thickening but the bladder is also not distended. Stomach/Bowel: Large amount of stool in the rectum. Moderate amount of stool throughout most of the colon. Overall, the stool burden in the colon has slightly decreased since 05/15/2015. Oral contrast throughout the small bowel without evidence for dilatation or obstruction. Vascular/Lymphatic:  Extensive calcifications involving the abdominal aorta without aneurysm. Iliac arteries are also heavily calcified. No significant lymphadenopathy. Reproductive: No gross abnormality to the prostate or seminal vesicles. Other: The amount of pelvic ascites is similar to the previous examination but there is increased ascites in the upper abdomen, particularly around the liver. In addition, there is increased subcutaneous edema throughout the abdomen and pelvis compared to 05/15/2015. Musculoskeletal: Extensive facet arthropathy at the lumbosacral junction. No acute bone abnormality. IMPRESSION: There continues to be a large amount of stool in the colon but the stool burden has slightly decreased since 05/15/2015. Increased abdominal ascites and subcutaneous edema. There is also a new left pleural effusion. Findings are compatible with a fluid overload state. Difficult to exclude mild cirrhosis. Cholelithiasis without gallbladder distension. Again noted is mild bladder wall thickening which is nonspecific. Electronically Signed   By: Markus Daft M.D.   On: 05/22/2015 17:29   Dg Chest 2 View  05/22/2015  CLINICAL DATA:  Fatigue, abdominal pain. EXAM: CHEST  2 VIEW  COMPARISON:  05/15/2015 FINDINGS: Cardiac pacemaker, with leads overlying the right atrium, right ventricle and coronary sinus, and postsurgical changes from CABG are stable. Cardiomediastinal silhouette is stably enlarged. Mediastinal contours appear intact. Atherosclerotic calcifications of the aorta noted. There is no evidence of focal airspace consolidation, or pneumothorax. There are small bilateral pleural effusions. Osseous structures are without acute abnormality. Soft tissues are grossly normal. IMPRESSION: Small bilateral pleural effusions. Stable enlargement of the cardiac silhouette. Electronically Signed   By: Fidela Salisbury M.D.   On: 05/22/2015 15:29      IMPRESSION AND PLAN:   80 y.o. male with a known history of chronic systolic CHF, CAD s/p CABG, ICM, afib s/p ablation and pacer came to ED for weight gain of about 16 pounds over 2 weeks.   # Acute on chronic systolic CHF exacerbation: EF mildly reduced (i.e. 40%) by most recent TTE. Continue beta-blocker, as tolerated by HR, BP, and renal fxn. Will hold ace-i due to renal function. Feel symptomatology primarily due to ICM. However, no targets for revascularization, limited ability uptitrate HF meds, and not a candidate for advanced HF therapies. -would repeat echo to reeval RV/LV fxn given symptomatology -will try lasix drip and assess clinical course/prognosis -continue carvedilol 3.125 mg PO bid, can do uptitrating as tolerated -continue spironolactone 25 mg PO q d -may consider adding ACEI back in future but will be limited by renal fxn/K  # Acute on chronic kidney disease stage III: c/s nephrology, monitor renal function closely while on Lasix drip.  #CAD s/p CABG: Severe 3vCAD w/ patent LIMA. RCA occluded at ostium and SVG X 2 occluded as well. One free RIMA graft to PDA had ~30% lesion. Ischemic symptoms by hx over last 2 months. Per cardio notes - Complicated anatomy and no obvious targets for PCI on reviewing recent  cath -continue ASA and high-potency statin - consider adding long-acting nitrate   #Afib s/p Ablation: PVI w/ Dr. Mylinda Latina 11/2014. Has maintained NSR since that time. NOAC for primary stroke prevention given CHADS2-VASc = 5 suggesting ~7.2% annual risk of stroke. -continue apixaban - renal dose adjustment per pharmacy  #Anemia: likely due to CKD. Monitor.  #Hypothyroidism: check TSH, continue synthroid.  * constipation: resolved.    All the records are reviewed and case discussed with ED provider. Management plans discussed with the patient, family and they are in agreement.  CODE STATUS: Full Code  TOTAL TIME TAKING CARE OF THIS PATIENT: 55 minutes.  [facility], Jacquelyn Shadrick M.D on 05/22/2015 at 7:21 PM  Between 7am to 6pm - Pager - [phone]  After 6pm go to [email or web address] - password [email or web address] [facility]  Office  [phone]  CC: Primary care physician; [name], MD

## 2015-05-22 NOTE — ED Provider Notes (Addendum)
[facility] Emergency Department Provider Note  ____________________________________________   I have reviewed the triage vital signs and the nursing notes.   HISTORY  Chief Complaint Constipation  and multiple complaints   HPI Jeffrey Gallegos is a 80 y.o. male who presents today complaining of constipation principally. Patient actually has a 20 year history of constipation he states he's got a problem there his rectum which makes it hard for him to generate a bowel movement. Remarkably however, patient actually just had a colonoscopy after extensive bowel prep which did not show extensive constipation at that time. He and the family however convinced that he is constipated because he has not produced significant bowel movement since this weekend when he was doing his bowel prep for his colonoscopy. The patient states he's been complaining of a constipation for 20 years and no one ever takes him seriously and he feels that he needs an emergent surgery on his rectum for this. He has had no melena no bright red blood per rectum and he is not complaining of abdominal pain. In addition, patient states he is having trouble generating urine. He states he feels that he has a "blockage" and that although he is urinating is not a satisfying amount. No dysuria.   The patient also states that he has had a 16 pound weight gain over the last few weeks to months, and that his lower extremity swelling is worse he has baseline shortness of breath and that feels like it might be a little bit worse too. Denies orthopnea.   Past Medical History  Diagnosis Date  . History of MI (myocardial infarction) 2011  . History of mumps     Childhood  . Coronary artery disease   . Hypertension   . Sleep apnea   . Thyroid disease     synthroid  . Anemia   . Vitamin D deficiency   . Chronic systolic CHF (congestive heart failure) ([place])   . HLD (hyperlipidemia)   . GERD (gastroesophageal  reflux disease)   . A-fib ([place])     on eliquis  . BPH (benign prostatic hyperplasia)   . CKD (chronic kidney disease), stage III     Patient Active Problem List   Diagnosis Date Noted  . Acute on chronic systolic CHF (congestive heart failure) ([place]) 05/15/2015  . Constipation 05/15/2015  . Anemia 05/15/2015  . Hyponatremia 05/15/2015  . Chronic kidney disease 02/21/2015  . Bradycardia 01/16/2015  . A-fib ([place]) 10/26/2014  . Basal cell carcinoma of skin 10/26/2014  . BPH (benign prostatic hyperplasia) 10/26/2014  . Arteriosclerosis of coronary artery 10/26/2014  . ED (erectile dysfunction) of organic origin 10/26/2014  . Hyperlipidemia 10/26/2014  . BP (high blood pressure) 10/26/2014  . Adult hypothyroidism 10/26/2014  . Diabetes ([place]) 10/26/2014  . Polypharmacy 10/26/2014  . Chronic systolic heart failure ([place]) 09/18/2014  . Cardiomyopathy, ischemic 09/18/2014  . Partial anomalous pulmonary venous connection 09/18/2014  . Disorder of right ventricle of heart 09/18/2014  . Atrial fibrillation, chronic ([place]) 09/18/2014  . Fecal occult blood test positive 08/28/2014  . 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 Benign essential HTN 10/20/2013  . CAD in native artery 10/20/2013  . Severe tricuspid regurgitation 01/24/2012    Past Surgical History  Procedure Laterality Date  . Coronary artery bypass graft  11/27/2009    4V at [facility]  . Lasik Bilateral 2002  . Cyst excision Right     Foot  . Leg  surgery      Right leg fracture  . Cardiac catheterization    . Electrophysiologic study N/A 01/13/2015    Procedure: CARDIOVERSION;  Surgeon: Teodoro Spray, MD;  Location: [facility] ORS;  Service: Cardiovascular;  Laterality: N/A;  . Esophagogastroduodenoscopy (egd) with propofol N/A 05/19/2015    Procedure: ESOPHAGOGASTRODUODENOSCOPY (EGD) WITH PROPOFOL;  Surgeon: Josefine Class, MD;  Location: [facility] ENDOSCOPY;  Service: Endoscopy;  Laterality:  N/A;  . Colonoscopy with propofol N/A 05/19/2015    Procedure: COLONOSCOPY WITH PROPOFOL;  Surgeon: Josefine Class, MD;  Location: [facility] ENDOSCOPY;  Service: Endoscopy;  Laterality: N/A;    Current Outpatient Rx  Name  Route  Sig  Dispense  Refill  . acetaminophen (TYLENOL) 500 MG tablet   Oral   Take 500 mg by mouth every 6 (six) hours as needed for mild pain, moderate pain, fever or headache.         Marland Kitchen amiodarone (PACERONE) 200 MG tablet   Oral   Take 200 mg by mouth daily.          Marland Kitchen atorvastatin (LIPITOR) 80 MG tablet      TAKE ONE (1) TABLET BY MOUTH EVERY DAY Patient taking differently: TAKE ONE (0.5) TABLET BY MOUTH EVERY OTHER NIGHT   30 tablet   0     PATIENT NEEDS TO SCHEDULE OFFICE VISIT FOR FOLLOW  ...   . carvedilol (COREG) 3.125 MG tablet   Oral   Take 1 tablet by mouth 2 (two) times daily.      2   . ELIQUIS 5 MG TABS tablet   Oral   Take 5 mg by mouth 2 (two) times daily.       2     Dispense as written.   Marland Kitchen levothyroxine (SYNTHROID, LEVOTHROID) 112 MCG tablet   Oral   Take 1 tablet (112 mcg total) by mouth daily before breakfast.   30 tablet   3   . nitroGLYCERIN (NITROSTAT) 0.4 MG SL tablet   Sublingual   Place 0.4 mg under the tongue every 5 (five) minutes as needed for chest pain.         . pantoprazole (PROTONIX) 40 MG tablet   Oral   Take 1 tablet (40 mg total) by mouth 2 (two) times daily. Patient taking differently: Take 40 mg by mouth daily.    30 tablet   12   . polyethylene glycol (MIRALAX) packet   Oral   Take 17 g by mouth daily.   14 each   0   . Saw [place]metto, Serenoa repens, 450 MG CAPS   Oral   Take 3 capsules by mouth 2 (two) times daily.          Marland Kitchen spironolactone (ALDACTONE) 25 MG tablet   Oral   Take 25 mg by mouth daily.       2   . tadalafil (CIALIS) 5 MG tablet   Oral   Take 1 tablet by mouth daily.         Marland Kitchen torsemide (DEMADEX) 10 MG tablet   Oral   Take 10-20 mg by mouth See admin  instructions. 2 tablets every morning and 1 tablet every evening           Allergies Review of patient's allergies indicates no known allergies.  Family History  Problem Relation Age of Onset  . Cancer Mother     [place]par Cola of death 38  . Heart attack Father     age of  death 11  . Heart failure Sister   . Seizures Sister   . Ulcers Sister   . Heart attack Maternal Uncle     age of death 36  . Heart attack Paternal Uncle     age of death 45  . Stroke Sister     age of death 30  . Heart attack Paternal Uncle     age of death 87    Social History Social History  Substance Use Topics  . Smoking status: Former Smoker    Types: Cigarettes    Quit date: 07/26/1968  . Smokeless tobacco: Current User    Types: Chew     Comment: Quit smoking in the 1970s  . Alcohol Use: 0.0 oz/week    0 Standard drinks or equivalent per week     Comment: Drinks Scotch    Review of Systems Constitutional: No fever/chills Eyes: No visual changes. ENT: No sore throat. No stiff neck no neck pain Cardiovascular: Denies chest pain. Respiratory: Denies shortness of breath. Gastrointestinal:   no vomiting.  No diarrhea. See history of present illness  Genitourinary: Negative for dysuria. Musculoskeletal: Negative lower extremity swelling Skin: Negative for rash. Neurological: Negative for headaches, focal weakness or numbness. 10-point ROS otherwise negative.  ____________________________________________   PHYSICAL EXAM:  VITAL SIGNS: ED Triage Vitals  Enc Vitals Group     BP 05/22/15 1327 89/57 mmHg     Pulse Rate 05/22/15 1327 111     Resp 05/22/15 1327 20     Temp 05/22/15 1327 97.5 F (36.4 C)     Temp Source 05/22/15 1327 Oral     SpO2 05/22/15 1327 97 %     Weight 05/22/15 1327 152 lb (68.947 kg)     Height 05/22/15 1327 5\' 8"  (1.727 m)     Head Cir --      Peak Flow --      Pain Score 05/22/15 1328 0     Pain Loc --      Pain Edu? --      Excl. in [place]? --      Constitutional: Alert and oriented. Well appearing and in no acute distress.Anxious and upset  Eyes: Conjunctivae are normal. PERRL. EOMI. Head: Atraumatic. Nose: No congestion/rhinnorhea. Mouth/Throat: Mucous membranes are moist.  Oropharynx non-erythematous. Neck: No stridor.   Nontender with no meningismus Cardiovascular: Normal rate, regular rhythm. Grossly normal heart sounds, Faint systolic murmur appreciated systolic.  Good peripheral circulation. Respiratory: Normal respiratory effort.  No retractions. Lungs CTAB. Gastrointestinal: Soft and nontender. No distention. No guarding no rebound Back:  There is no focal tenderness or step off there is no midline tenderness there are no lesions noted. there is no CVA tenderness Rectal exam: Small amount of  brown stool in the vault no impaction noted  Musculoskeletal: No lower extremity tenderness. No joint effusions, no DVT signs strong distal pulsesget bilateral pedal edema up to his knees symmetric pitting  Neurologic:  Normal speech and language. No gross focal neurologic deficits are appreciated.  Skin:  Skin is warm, dry and intact. No rash noted. Psychiatric: Mood and affect areanxious. Speech and behavior are normal.  ____________________________________________   LABS (all labs ordered are listed, but only abnormal results are displayed)  Labs Reviewed  TROPONIN I - Abnormal; Notable for the following:    Troponin I 0.13 (*)    All other components within normal limits  COMPREHENSIVE METABOLIC PANEL - Abnormal; Notable for the following:    Sodium 127 (*)  Chloride 96 (*)    Glucose, Bld 130 (*)    BUN 42 (*)    Creatinine, Ser 2.27 (*)    Calcium 8.1 (*)    Total Protein 6.3 (*)    Albumin 2.8 (*)    Alkaline Phosphatase 178 (*)    Total Bilirubin 1.4 (*)    GFR calc non Af Amer 26 (*)    GFR calc Af Amer 30 (*)    All other components within normal limits  CBC WITH DIFFERENTIAL/PLATELET - Abnormal; Notable  for the following:    RBC 3.55 (*)    Hemoglobin 9.1 (*)    HCT 29.1 (*)    MCH 25.6 (*)    MCHC 31.2 (*)    RDW 24.3 (*)    Platelets 104 (*)    All other components within normal limits  PROTIME-INR - Abnormal; Notable for the following:    Prothrombin Time 25.9 (*)    All other components within normal limits  LIPASE, BLOOD  BRAIN NATRIURETIC PEPTIDE  URINALYSIS COMPLETEWITH MICROSCOPIC ([facility] ONLY)   ____________________________________________  EKG  I personally interpreted EKG, paced rhythm AV dual paced no further interpretation is possible rate 60  ____________________________________________  RADIOLOGY  Personally reviewed  ____________________________________________   PROCEDURES  Procedure(s) performed: None  Critical Care performed:   ____________________________________________   INITIAL IMPRESSION / ASSESSMENT AND PLAN / ED COURSE  Pertinent labs & imaging results that were available during my care of the patient were reviewed by me and considered in my medical decision making (see chart for details).  Patient here with multiple complaints. He has been constipated literally for 20 years and even though he had a negative bowel prep and unremarkable colonoscopy which can be reviewed in our notes, he is convinced that he is still constipated because he has not produced a satisfying bowel movement since the procedure. I have low suspicion that the patient has any significant abdominal pathology but I will obtain a CT scan given his age and his complaint of a sensation of bowel blockage, certainly could be possible that he has a cancer that was missed on colonoscopy or a small bowel pathology that would be limiting his ability to generate bowel movements. However, again I have very low suspicion that we will find acute pathology with that. I have explained to him that we are not likely to be able to obtain emergent surgery for his chronic constipation issue and he  is somewhat upset about this but seems to understand. Patient does also state that he is producing less urine than normal although he is taking fluid pill we will place a Foley catheter to see if he has retained urine and we will send a urinalysis. I do not palpate any evidence of significant urinary retention Finally, and most concerning patient has bilateral pitting lower extremity edema and claims a 16 pound weight gain over the last several weeks. I have sent a troponin which is borderline positive, he has had worsening kidney issues as well. He is not actually having chest pain we does have baseline shortness of breath which seems to be getting worse. Patient's blood pressure is slightly low here but that does run concordant with his multiple prior visits. We will send a BNP, chest x-ray and reassess that as well.  ____________________________________________   ----------------------------------------- 6:27 PM on 05/22/2015 -----------------------------------------  Patient had a large bowel movement in the emergency room, there is still a stool burn but nothing that appears dangerous at  this time. Patient's biggest issue I think today in the emergency room is a 16 pound weight gain and his dyspnea on exertion even with doing activities of daily life. His creatinine is somewhat elevated as well. We are giving him IV Lasix here, we'll minimum to the hospital for CHF in the can possibly continue to work him up for his constipation or concerns at that time  FINAL CLINICAL IMPRESSION(S) / ED DIAGNOSES  Final diagnoses:  None     Schuyler Amor, MD 05/22/15 [place], MD 05/22/15 [phone]

## 2015-05-22 NOTE — ED Notes (Signed)
Pt to ed with c/o constipation and rectal blockage of stool.  Pt states abd distention, pain, and difficulty urinating.

## 2015-05-22 NOTE — Progress Notes (Signed)
Cardiac Individual Treatment Plan  Patient Details  Name: Jeffrey Gallegos MRN: [id number] Date of Birth: [birth year] Referring Provider:  No ref. provider found  Initial Encounter Date:  03/18/2015  Visit Diagnosis: Chronic systolic congestive heart failure ([place])  Patient's Home Medications on Admission:  Current outpatient prescriptions:  .  Acetaminophen (TYLENOL PO), Take by mouth as needed., Disp: , Rfl:  .  amiodarone (PACERONE) 200 MG tablet, Take 200 mg by mouth daily. , Disp: , Rfl:  .  atorvastatin (LIPITOR) 80 MG tablet, TAKE ONE (1) TABLET BY MOUTH EVERY DAY, Disp: 30 tablet, Rfl: 0 .  carvedilol (COREG) 3.125 MG tablet, Take 1 tablet by mouth 2 (two) times daily., Disp: , Rfl: 2 .  ELIQUIS 5 MG TABS tablet, Take 5 mg by mouth 2 (two) times daily. , Disp: , Rfl: 2 .  levothyroxine (SYNTHROID, LEVOTHROID) 112 MCG tablet, Take 1 tablet (112 mcg total) by mouth daily before breakfast., Disp: 30 tablet, Rfl: 3 .  nitroGLYCERIN (NITROSTAT) 0.4 MG SL tablet, Place 0.4 mg under the tongue every 5 (five) minutes as needed for chest pain., Disp: , Rfl:  .  pantoprazole (PROTONIX) 40 MG tablet, Take 1 tablet (40 mg total) by mouth 2 (two) times daily., Disp: 30 tablet, Rfl: 12 .  polyethylene glycol (MIRALAX) packet, Take 17 g by mouth daily., Disp: 14 each, Rfl: 0 .  Saw [place]met[place], Serenoa repens, 450 MG CAPS, Take 3 tablets by mouth 2 (two) times daily., Disp: , Rfl:  .  spironolactone (ALDACTONE) 25 MG tablet, Take 25 mg by mouth daily. , Disp: , Rfl: 2 .  tadalafil (CIALIS) 5 MG tablet, Take 1 tablet by mouth daily., Disp: , Rfl:  .  torsemide (DEMADEX) 10 MG tablet, Take by mouth. Takes 20 in the morning and 10 in the evening, Disp: , Rfl:   Past Medical History: Past Medical History  Diagnosis Date  . History of MI (myocardial infarction) 2011  . History of mumps     Childhood  . Coronary artery disease   . Hypertension   . Sleep apnea   . Thyroid disease     synthroid  .  Anemia   . Vitamin D deficiency   . Chronic systolic CHF (congestive heart failure) ([place])   . HLD (hyperlipidemia)   . GERD (gastroesophageal reflux disease)   . A-fib ([place])     on eliquis  . BPH (benign prostatic hyperplasia)   . CKD (chronic kidney disease), stage III     Tobacco Use: History  Smoking status  . Former Smoker  . Types: Cigarettes  . Quit date: 07/26/1968  Smokeless tobacco  . Current User  . Types: Chew    Comment: Quit smoking in the 1970s    Labs: Recent Review Scientist, physiological    Labs for ITP Cardiac and Pulmonary Rehab Latest Ref Rng 08/26/2014   Cholestrol 0 - 200 mg/dL 56   LDLCALC - 19   HDL 35 - 70 mg/dL 24(A)   Trlycerides 40 - 160 mg/dL 65   Hemoglobin A1c 4.0 - 6.0 % 6.7(A)       Exercise Target Goals:    Exercise Program Goal: Individual exercise prescription set with THRR, safety & activity barriers. Participant demonstrates ability to understand and report RPE using BORG scale, to self-measure pulse accurately, and to acknowledge the importance of the exercise prescription.  Exercise Prescription Goal: Starting with aerobic activity 30 plus minutes a day, 3 days per week for initial exercise prescription.  Provide home exercise prescription and guidelines that participant acknowledges understanding prior to discharge.  Activity Barriers & Risk Stratification:     Activity Barriers & Risk Stratification - 03/18/15 0816    Activity Barriers & Risk Stratification   Activity Barriers None;Deconditioning   Risk Stratification High      6 Minute Walk:     6 Minute Walk      03/18/15 1024       6 Minute Walk   Phase Initial     Distance 800 feet     Walk Time 5.38 minutes     Resting HR 61 bpm     Resting BP 108/52 mmHg     Max Ex. HR 114 bpm     Max Ex. BP 100/50 mmHg     RPE 15     Symptoms No        Initial Exercise Prescription:     Initial Exercise Prescription - 03/18/15 1000    Date of Initial Exercise  Prescription   Date 03/18/15   Treadmill   MPH 1.5   Grade 0   Minutes 10   Bike   Level 0.2   Watts 10   Minutes 10   Recumbant Bike   Level 2   RPM 40   Watts 15   Minutes 10   NuStep   Level 2   Watts 30   Minutes 15   Arm Ergometer   Level 1   Watts 8   Minutes 10   Arm/Foot Ergometer   Level 4   Watts 12   Minutes 10   Cybex   Level 1   RPM 50   Minutes 10   Recumbant Elliptical   Level 1   RPM 40   Watts 10   Minutes 10   Elliptical   Level 1   Speed 3   Minutes 1   REL-XR   Level 2   Watts 35   Minutes 15   Prescription Details   Frequency (times per week) 3   Duration Progress to 30 minutes of continuous aerobic without signs/symptoms of physical distress   Intensity   THRR REST +  30   Ratings of Perceived Exertion 11-15   Progression Continue progressive overload as per policy without signs/symptoms or physical distress.   Resistance Training   Training Prescription Yes   Weight 2   Reps 10-15      Exercise Prescription Changes:     Exercise Prescription Changes      04/03/15 1400           Exercise Review   Progression Yes       Response to Exercise   Blood Pressure (Admit) 110/60 mmHg       Blood Pressure (Exercise) 126/56 mmHg       Blood Pressure (Exit) 108/60 mmHg       Heart Rate (Admit) 62 bpm       Heart Rate (Exercise) 76 bpm       Heart Rate (Exit) 56 bpm       Rating of Perceived Exertion (Exercise) 13       Symptoms No       Duration Progress to 30 minutes of continuous aerobic without signs/symptoms of physical distress       Intensity Rest + 30       Progression Continue progressive overload as per policy without signs/symptoms or physical distress.       Resistance Training  Training Prescription Yes       Weight 3       Reps 10-15       Interval Training   Interval Training No       Treadmill   MPH 1.5       Grade 0       Minutes 15       NuStep   Level 2       Watts 25       Minutes 15           Discharge Exercise Prescription (Final Exercise Prescription Changes):     Exercise Prescription Changes - 04/03/15 1400    Exercise Review   Progression Yes   Response to Exercise   Blood Pressure (Admit) 110/60 mmHg   Blood Pressure (Exercise) 126/56 mmHg   Blood Pressure (Exit) 108/60 mmHg   Heart Rate (Admit) 62 bpm   Heart Rate (Exercise) 76 bpm   Heart Rate (Exit) 56 bpm   Rating of Perceived Exertion (Exercise) 13   Symptoms No   Duration Progress to 30 minutes of continuous aerobic without signs/symptoms of physical distress   Intensity Rest + 30   Progression Continue progressive overload as per policy without signs/symptoms or physical distress.   Resistance Training   Training Prescription Yes   Weight 3   Reps 10-15   Interval Training   Interval Training No   Treadmill   MPH 1.5   Grade 0   Minutes 15   NuStep   Level 2   Watts 25   Minutes 15      Nutrition:  Target Goals: Understanding of nutrition guidelines, daily intake of sodium <1582m, cholesterol <2051m calories 30% from fat and 7% or less from saturated fats, daily to have 5 or more servings of fruits and vegetables.  Biometrics:     Pre Biometrics - 03/18/15 1021    Pre Biometrics   Height 5' 8.5" (1.74 m)   Weight 142 lb 8 oz (64.638 kg)   Waist Circumference 36.5 inches   Hip Circumference 36.25 inches   Waist to Hip Ratio 1.01 %   BMI (Calculated) 21.4       Nutrition Therapy Plan and Nutrition Goals:     Nutrition Therapy & Goals - 04/08/15 0729    Intervention Plan   Intervention Using nutrition plan and personal goals to gain a healthy nutrition lifestyle. Add exercise as prescribed.      Nutrition Discharge: Rate Your Plate Scores:     Rate Your Plate - 0936/[id number]/9467654  Rate Your Plate Scores   Pre Score 78   Pre Score % 87 %      Nutrition Goals Re-Evaluation:     Nutrition Goals Re-Evaluation      04/17/15 1253 05/19/15 1528         Personal Goal  #1 Re-Evaluation   Personal Goal #1 Will see his kidney doctor tomorrow and go from there.  Wife called to say [place] in the [facility]      Goal Progress Seen  Yes         Psychosocial: Target Goals: Acknowledge presence or absence of depression, maximize coping skills, provide positive support system. Participant is able to verbalize types and ability to use techniques and skills needed for reducing stress and depression.  Initial Review & Psychosocial Screening:     Initial Psych Review & Screening - 03/18/15 [id number]  Family Dynamics   Good Support System? Yes   Barriers   Psychosocial barriers to participate in program There are no identifiable barriers or psychosocial needs.;The patient should benefit from training in stress management and relaxation.   Screening Interventions   Interventions Encouraged to exercise;Program counselor consult      Quality of Life Scores:     Quality of Life - 03/18/15 1628    Quality of Life Scores   Health/Function Pre 22.8 %   Socioeconomic Pre 30 %   Psych/Spiritual Pre 29.14 %   Family Pre 30 %   GLOBAL Pre 26.44 %      PHQ-9:     Recent Review Flowsheet Data    Depression screen [facility] 2/9 03/18/2015   Decreased Interest 0   Down, Depressed, Hopeless 0   PHQ - 2 Score 0   Altered sleeping 0   Tired, decreased energy 1   Change in appetite 0   Feeling bad or failure about yourself  0   Trouble concentrating 0   Moving slowly or fidgety/restless 0   Suicidal thoughts 0   PHQ-9 Score 1   Difficult doing work/chores Somewhat difficult      Psychosocial Evaluation and Intervention:     Psychosocial Evaluation - 04/01/15 0943    Psychosocial Evaluation & Interventions   Interventions Encouraged to exercise with the program and follow exercise prescription   Comments Counselor met with Jeffrey Gallegos for a psychosocial evaluation.  He is an almost 80 year old (in 2 weeks) who has a history  of heart attacks, A Fib and Congestive Heart Failure.  He has a spouse of 12 years and reports he is sleeping and eating well at this time.   He denies a history of depression or anxiety or current symptoms.  He reports his mood is typically positive although he gets a little concerned at times about his health issues and some property sale transaction that are currently going on in his life.  His goal for this program is to walk without "wearing out" and increase his stamina and strength.  He plans to participate in a follow up program after graduating from this program.     Continued Psychosocial Services Needed --  Jeffrey Gallegos wil benefit from the psychoeducational components of this program, especially on stress management and relaxation.        Psychosocial Re-Evaluation:   Vocational Rehabilitation: Provide vocational rehab assistance to qualifying candidates.   Vocational Rehab Evaluation & Intervention:     Vocational Rehab - 03/18/15 0817    Initial Vocational Rehab Evaluation & Intervention   Assessment shows need for Vocational Rehabilitation No      Education: Education Goals: Education classes will be provided on a weekly basis, covering required topics. Participant will state understanding/return demonstration of topics presented.  Learning Barriers/Preferences:     Learning Barriers/Preferences - 03/18/15 0817    Learning Barriers/Preferences   Learning Barriers None   Learning Preferences None      Education Topics: General Nutrition Guidelines/Fats and Fiber: -Group instruction provided by verbal, written material, models and posters to present the general guidelines for heart healthy nutrition. Gives an explanation and review of dietary fats and fiber.   Controlling Sodium/Reading Food Labels: -Group verbal and written material supporting the discussion of sodium use in heart healthy nutrition. Review and explanation with models, verbal and written materials  for utilization of the food label.   Exercise Physiology & Risk Factors: - Group verbal and written  instruction with models to review the exercise physiology of the cardiovascular system and associated critical values. Details cardiovascular disease risk factors and the goals associated with each risk factor.   Aerobic Exercise & Resistance Training: - Gives group verbal and written discussion on the health impact of inactivity. On the components of aerobic and resistive training programs and the benefits of this training and how to safely progress through these programs.   Flexibility, Balance, General Exercise Guidelines: - Provides group verbal and written instruction on the benefits of flexibility and balance training programs. Provides general exercise guidelines with specific guidelines to those with heart or lung disease. Demonstration and skill practice provided.   Stress Management: - Provides group verbal and written instruction about the health risks of elevated stress, cause of high stress, and healthy ways to reduce stress.   Depression: - Provides group verbal and written instruction on the correlation between heart/lung disease and depressed mood, treatment options, and the stigmas associated with seeking treatment.   Anatomy & Physiology of the Heart: - Group verbal and written instruction and models provide basic cardiac anatomy and physiology, with the coronary electrical and arterial systems. Review of: AMI, Angina, Valve disease, Heart Failure, Cardiac Arrhythmia, Pacemakers, and the ICD.   Cardiac Procedures: - Group verbal and written instruction and models to describe the testing methods done to diagnose heart disease. Reviews the outcomes of the test results. Describes the treatment choices: Medical Management, Angioplasty, or Coronary Bypass Surgery.   Cardiac Medications: - Group verbal and written instruction to review commonly prescribed medications for  heart disease. Reviews the medication, class of the drug, and side effects. Includes the steps to properly store meds and maintain the prescription regimen.   Go Sex-Intimacy & Heart Disease, Get SMART - Goal Setting: - Group verbal and written instruction through game format to discuss heart disease and the return to sexual intimacy. Provides group verbal and written material to discuss and apply goal setting through the application of the S.M.A.R.T. Method.   Other Matters of the Heart: - Provides group verbal, written materials and models to describe Heart Failure, Angina, Valve Disease, and Diabetes in the realm of heart disease. Includes description of the disease process and treatment options available to the cardiac patient.   Exercise & Equipment Safety: - Individual verbal instruction and demonstration of equipment use and safety with use of the equipment.          Cardiac Rehab from 03/18/2015 in [facility] Cardiac Rehab   Date  03/18/15   Educator  SB   Instruction Review Code  2- meets goals/outcomes      Infection Prevention: - Provides verbal and written material to individual with discussion of infection control including proper hand washing and proper equipment cleaning during exercise session.      Cardiac Rehab from 03/18/2015 in [facility] Cardiac Rehab   Date  03/18/15   Educator  SB   Instruction Review Code  2- meets goals/outcomes      Falls Prevention: - Provides verbal and written material to individual with discussion of falls prevention and safety.      Cardiac Rehab from 03/18/2015 in [facility] Cardiac Rehab   Date  03/18/15   Educator  Sb   Instruction Review Code  2- meets goals/outcomes      Diabetes: - Individual verbal and written instruction to review signs/symptoms of diabetes, desired ranges of glucose level fasting, after meals and with exercise. Advice that pre and post exercise glucose checks will  be done for 3 sessions at entry of program.    Knowledge  Questionnaire Score:     Knowledge Questionnaire Score - 03/18/15 1625    Knowledge Questionnaire Score   Pre Score 23/28      Personal Goals and Risk Factors at Admission:     Personal Goals and Risk Factors at Admission - 03/18/15 0824    Personal Goals and Risk Factors on Admission    Weight Management Yes   Intervention Learn and follow the exercise and diet guidelines while in the program. Utilize the nutrition and education classes to help gain knowledge of the diet and exercise expectations in the program   Increase Aerobic Exercise and Physical Activity Yes;Sedentary   Intervention While in program, learn and follow the exercise prescription taught. Start at a low level workload and increase workload after able to maintain previous level for 30 minutes. Increase time before increasing intensity.   Intervention Provide exercise education and an individualized exercise prescription that will provide continued progressive overload as per policy without signs/symptoms of physical distress.   Diabetes No   Hypertension No   Lipids Yes   Goal Cholesterol controlled with medications as prescribed, with individualized exercise RX and with personalized nutrition plan. Value goals: LDL < 10m, HDL > 466m Participant states understanding of desired cholesterol values and following prescriptions.   Intervention Provide nutrition & aerobic exercise along with prescribed medications to achieve LDL <7032mHDL >59m4m Stress Yes   Goal To meet with psychosocial counselor for stress and relaxation information and guidance. To state understanding of performing relaxation techniques and or identifying personal stressors.   Intervention Provide education on types of stress, identifiying stressors, and ways to cope with stress. Provide demonstration and active practice of relaxation techniques.      Personal Goals and Risk Factors Review:      Goals and Risk Factor Review      04/17/15 1253  05/07/15 1414 05/19/15 1529       Increase Aerobic Exercise and Physical Activity   Goals Progress/Improvement seen  No No No     Comments I called Jeffrey Gallegos he said "the exercise is about to kill me". I exercised on Thursday and needed Tuesday to get ready. Now I feel the [place] is taking my strength. MD increased Torsemide but he still have feet swelling,. Jeffrey Gallegos that he is going to the Nephologist tomorrow (Kidney doctor). He will check with his MD. I told him that they can bring him down in wheelchair but he really doesn't want to do. Has been out of Cardiac Rehab a while.  Wife called to say [facility] hospital [facility]   Stress   Goal   --  In hospital this past week.         Personal Goals Discharge (Final Personal Goals and Risk Factors Review):      Goals and Risk Factor Review - 05/19/15 1529    Increase Aerobic Exercise and Physical Activity   Goals Progress/Improvement seen  No   Comments Wife called to say Jeffrey [facility]legos the hospital [facility]   Goal --  In hospital this past week.        Comments:

## 2015-05-22 NOTE — ED Notes (Signed)
Hand-off report received by Elane Fritz, RN.

## 2015-05-22 NOTE — Progress Notes (Signed)
Cardiac Individual Treatment Plan  Patient Details  Name: Jeffrey Gallegos MRN: [id number] Date of Birth: [birth year] Referring Provider:  No ref. provider found  Initial Encounter Date:    Visit Diagnosis: Chronic systolic congestive heart failure ([place])  Patient's Home Medications on Admission:  Current outpatient prescriptions:  .  Acetaminophen (TYLENOL PO), Take by mouth as needed., Disp: , Rfl:  .  amiodarone (PACERONE) 200 MG tablet, Take 200 mg by mouth daily. , Disp: , Rfl:  .  atorvastatin (LIPITOR) 80 MG tablet, TAKE ONE (1) TABLET BY MOUTH EVERY DAY, Disp: 30 tablet, Rfl: 0 .  carvedilol (COREG) 3.125 MG tablet, Take 1 tablet by mouth 2 (two) times daily., Disp: , Rfl: 2 .  ELIQUIS 5 MG TABS tablet, Take 5 mg by mouth 2 (two) times daily. , Disp: , Rfl: 2 .  levothyroxine (SYNTHROID, LEVOTHROID) 112 MCG tablet, Take 1 tablet (112 mcg total) by mouth daily before breakfast., Disp: 30 tablet, Rfl: 3 .  nitroGLYCERIN (NITROSTAT) 0.4 MG SL tablet, Place 0.4 mg under the tongue every 5 (five) minutes as needed for chest pain., Disp: , Rfl:  .  pantoprazole (PROTONIX) 40 MG tablet, Take 1 tablet (40 mg total) by mouth 2 (two) times daily., Disp: 30 tablet, Rfl: 12 .  polyethylene glycol (MIRALAX) packet, Take 17 g by mouth daily., Disp: 14 each, Rfl: 0 .  Saw Palmetto, Serenoa repens, 450 MG CAPS, Take 3 tablets by mouth 2 (two) times daily., Disp: , Rfl:  .  spironolactone (ALDACTONE) 25 MG tablet, Take 25 mg by mouth daily. , Disp: , Rfl: 2 .  tadalafil (CIALIS) 5 MG tablet, Take 1 tablet by mouth daily., Disp: , Rfl:  .  torsemide (DEMADEX) 10 MG tablet, Take by mouth. Takes 20 in the morning and 10 in the evening, Disp: , Rfl:   Past Medical History: Past Medical History  Diagnosis Date  . History of MI (myocardial infarction) 2011  . History of mumps     Childhood  . Coronary artery disease   . Hypertension   . Sleep apnea   . Thyroid disease     synthroid  . Anemia    . Vitamin D deficiency   . Chronic systolic CHF (congestive heart failure) ([place])   . HLD (hyperlipidemia)   . GERD (gastroesophageal reflux disease)   . A-fib ([place])     on eliquis  . BPH (benign prostatic hyperplasia)   . CKD (chronic kidney disease), stage III     Tobacco Use: History  Smoking status  . Former Smoker  . Types: Cigarettes  . Quit date: 07/26/1968  Smokeless tobacco  . Current User  . Types: Chew    Comment: Quit smoking in the 1970s    Labs: Recent Review Scientist, physiological    Labs for ITP Cardiac and Pulmonary Rehab Latest Ref Rng 08/26/2014   Cholestrol 0 - 200 mg/dL 56   LDLCALC - 19   HDL 35 - 70 mg/dL 24(A)   Trlycerides 40 - 160 mg/dL 65   Hemoglobin A1c 4.0 - 6.0 % 6.7(A)       Exercise Target Goals:    Exercise Program Goal: Individual exercise prescription set with THRR, safety & activity barriers. Participant demonstrates ability to understand and report RPE using BORG scale, to self-measure pulse accurately, and to acknowledge the importance of the exercise prescription.  Exercise Prescription Goal: Starting with aerobic activity 30 plus minutes a day, 3 days per week for initial exercise prescription.  Provide home exercise prescription and guidelines that participant acknowledges understanding prior to discharge.  Activity Barriers & Risk Stratification:     Activity Barriers & Risk Stratification - 03/18/15 0816    Activity Barriers & Risk Stratification   Activity Barriers None;Deconditioning   Risk Stratification High      6 Minute Walk:     6 Minute Walk      03/18/15 1024       6 Minute Walk   Phase Initial     Distance 800 feet     Walk Time 5.38 minutes     Resting HR 61 bpm     Resting BP 108/52 mmHg     Max Ex. HR 114 bpm     Max Ex. BP 100/50 mmHg     RPE 15     Symptoms No        Initial Exercise Prescription:     Initial Exercise Prescription - 03/18/15 1000    Date of Initial Exercise Prescription    Date 03/18/15   Treadmill   MPH 1.5   Grade 0   Minutes 10   Bike   Level 0.2   Watts 10   Minutes 10   Recumbant Bike   Level 2   RPM 40   Watts 15   Minutes 10   NuStep   Level 2   Watts 30   Minutes 15   Arm Ergometer   Level 1   Watts 8   Minutes 10   Arm/Foot Ergometer   Level 4   Watts 12   Minutes 10   Cybex   Level 1   RPM 50   Minutes 10   Recumbant Elliptical   Level 1   RPM 40   Watts 10   Minutes 10   Elliptical   Level 1   Speed 3   Minutes 1   REL-XR   Level 2   Watts 35   Minutes 15   Prescription Details   Frequency (times per week) 3   Duration Progress to 30 minutes of continuous aerobic without signs/symptoms of physical distress   Intensity   THRR REST +  30   Ratings of Perceived Exertion 11-15   Progression Continue progressive overload as per policy without signs/symptoms or physical distress.   Resistance Training   Training Prescription Yes   Weight 2   Reps 10-15      Exercise Prescription Changes:     Exercise Prescription Changes      04/03/15 1400           Exercise Review   Progression Yes       Response to Exercise   Blood Pressure (Admit) 110/60 mmHg       Blood Pressure (Exercise) 126/56 mmHg       Blood Pressure (Exit) 108/60 mmHg       Heart Rate (Admit) 62 bpm       Heart Rate (Exercise) 76 bpm       Heart Rate (Exit) 56 bpm       Rating of Perceived Exertion (Exercise) 13       Symptoms No       Duration Progress to 30 minutes of continuous aerobic without signs/symptoms of physical distress       Intensity Rest + 30       Progression Continue progressive overload as per policy without signs/symptoms or physical distress.       Resistance Training  Training Prescription Yes       Weight 3       Reps 10-15       Interval Training   Interval Training No       Treadmill   MPH 1.5       Grade 0       Minutes 15       NuStep   Level 2       Watts 25       Minutes 15          Discharge  Exercise Prescription (Final Exercise Prescription Changes):     Exercise Prescription Changes - 04/03/15 1400    Exercise Review   Progression Yes   Response to Exercise   Blood Pressure (Admit) 110/60 mmHg   Blood Pressure (Exercise) 126/56 mmHg   Blood Pressure (Exit) 108/60 mmHg   Heart Rate (Admit) 62 bpm   Heart Rate (Exercise) 76 bpm   Heart Rate (Exit) 56 bpm   Rating of Perceived Exertion (Exercise) 13   Symptoms No   Duration Progress to 30 minutes of continuous aerobic without signs/symptoms of physical distress   Intensity Rest + 30   Progression Continue progressive overload as per policy without signs/symptoms or physical distress.   Resistance Training   Training Prescription Yes   Weight 3   Reps 10-15   Interval Training   Interval Training No   Treadmill   MPH 1.5   Grade 0   Minutes 15   NuStep   Level 2   Watts 25   Minutes 15      Nutrition:  Target Goals: Understanding of nutrition guidelines, daily intake of sodium <1559m, cholesterol <2045m calories 30% from fat and 7% or less from saturated fats, daily to have 5 or more servings of fruits and vegetables.  Biometrics:     Pre Biometrics - 03/18/15 1021    Pre Biometrics   Height 5' 8.5" (1.74 m)   Weight 142 lb 8 oz (64.638 kg)   Waist Circumference 36.5 inches   Hip Circumference 36.25 inches   Waist to Hip Ratio 1.01 %   BMI (Calculated) 21.4       Nutrition Therapy Plan and Nutrition Goals:     Nutrition Therapy & Goals - 04/08/15 0729    Intervention Plan   Intervention Using nutrition plan and personal goals to gain a healthy nutrition lifestyle. Add exercise as prescribed.      Nutrition Discharge: Rate Your Plate Scores:     Rate Your Plate - 0926/37/8568850  Rate Your Plate Scores   Pre Score 78   Pre Score % 87 %      Nutrition Goals Re-Evaluation:     Nutrition Goals Re-Evaluation      04/17/15 1253 05/19/15 1528         Personal Goal #1 Re-Evaluation    Personal Goal #1 Will see his kidney doctor tomorrow and go from there.  Wife called to say EdMarkeises in the hospital but [facility]      Goal Progress Seen  Yes         Psychosocial: Target Goals: Acknowledge presence or absence of depression, maximize coping skills, provide positive support system. Participant is able to verbalize types and ability to use techniques and skills needed for reducing stress and depression.  Initial Review & Psychosocial Screening:     Initial Psych Review & Screening - 03/18/15 [id number]  Family Dynamics   Good Support System? Yes   Barriers   Psychosocial barriers to participate in program There are no identifiable barriers or psychosocial needs.;The patient should benefit from training in stress management and relaxation.   Screening Interventions   Interventions Encouraged to exercise;Program counselor consult      Quality of Life Scores:     Quality of Life - 03/18/15 1628    Quality of Life Scores   Health/Function Pre 22.8 %   Socioeconomic Pre 30 %   Psych/Spiritual Pre 29.14 %   Family Pre 30 %   GLOBAL Pre 26.44 %      PHQ-9:     Recent Review Flowsheet Data    Depression screen [facility] 2/9 03/18/2015   Decreased Interest 0   Down, Depressed, Hopeless 0   PHQ - 2 Score 0   Altered sleeping 0   Tired, decreased energy 1   Change in appetite 0   Feeling bad or failure about yourself  0   Trouble concentrating 0   Moving slowly or fidgety/restless 0   Suicidal thoughts 0   PHQ-9 Score 1   Difficult doing work/chores Somewhat difficult      Psychosocial Evaluation and Intervention:     Psychosocial Evaluation - 04/01/15 0943    Psychosocial Evaluation & Interventions   Interventions Encouraged to exercise with the program and follow exercise prescription   Comments Counselor met with Mr. Bennis for a psychosocial evaluation.  He is an almost 80 year old (in 2 weeks) who has a history of heart attacks,  A Fib and Congestive Heart Failure.  He has a spouse of 89 years and reports he is sleeping and eating well at this time.   He denies a history of depression or anxiety or current symptoms.  He reports his mood is typically positive although he gets a little concerned at times about his health issues and some property sale transaction that are currently going on in his life.  His goal for this program is to walk without "wearing out" and increase his stamina and strength.  He plans to participate in a follow up program after graduating from this program.     Continued Psychosocial Services Needed --  Mr. Mclaren wil benefit from the psychoeducational components of this program, especially on stress management and relaxation.        Psychosocial Re-Evaluation:   Vocational Rehabilitation: Provide vocational rehab assistance to qualifying candidates.   Vocational Rehab Evaluation & Intervention:     Vocational Rehab - 03/18/15 0817    Initial Vocational Rehab Evaluation & Intervention   Assessment shows need for Vocational Rehabilitation No      Education: Education Goals: Education classes will be provided on a weekly basis, covering required topics. Participant will state understanding/return demonstration of topics presented.  Learning Barriers/Preferences:     Learning Barriers/Preferences - 03/18/15 0817    Learning Barriers/Preferences   Learning Barriers None   Learning Preferences None      Education Topics: General Nutrition Guidelines/Fats and Fiber: -Group instruction provided by verbal, written material, models and posters to present the general guidelines for heart healthy nutrition. Gives an explanation and review of dietary fats and fiber.   Controlling Sodium/Reading Food Labels: -Group verbal and written material supporting the discussion of sodium use in heart healthy nutrition. Review and explanation with models, verbal and written materials for utilization  of the food label.   Exercise Physiology & Risk Factors: - Group verbal and written  instruction with models to review the exercise physiology of the cardiovascular system and associated critical values. Details cardiovascular disease risk factors and the goals associated with each risk factor.   Aerobic Exercise & Resistance Training: - Gives group verbal and written discussion on the health impact of inactivity. On the components of aerobic and resistive training programs and the benefits of this training and how to safely progress through these programs.   Flexibility, Balance, General Exercise Guidelines: - Provides group verbal and written instruction on the benefits of flexibility and balance training programs. Provides general exercise guidelines with specific guidelines to those with heart or lung disease. Demonstration and skill practice provided.   Stress Management: - Provides group verbal and written instruction about the health risks of elevated stress, cause of high stress, and healthy ways to reduce stress.   Depression: - Provides group verbal and written instruction on the correlation between heart/lung disease and depressed mood, treatment options, and the stigmas associated with seeking treatment.   Anatomy & Physiology of the Heart: - Group verbal and written instruction and models provide basic cardiac anatomy and physiology, with the coronary electrical and arterial systems. Review of: AMI, Angina, Valve disease, Heart Failure, Cardiac Arrhythmia, Pacemakers, and the ICD.   Cardiac Procedures: - Group verbal and written instruction and models to describe the testing methods done to diagnose heart disease. Reviews the outcomes of the test results. Describes the treatment choices: Medical Management, Angioplasty, or Coronary Bypass Surgery.   Cardiac Medications: - Group verbal and written instruction to review commonly prescribed medications for heart disease.  Reviews the medication, class of the drug, and side effects. Includes the steps to properly store meds and maintain the prescription regimen.   Go Sex-Intimacy & Heart Disease, Get SMART - Goal Setting: - Group verbal and written instruction through game format to discuss heart disease and the return to sexual intimacy. Provides group verbal and written material to discuss and apply goal setting through the application of the S.M.A.R.T. Method.   Other Matters of the Heart: - Provides group verbal, written materials and models to describe Heart Failure, Angina, Valve Disease, and Diabetes in the realm of heart disease. Includes description of the disease process and treatment options available to the cardiac patient.   Exercise & Equipment Safety: - Individual verbal instruction and demonstration of equipment use and safety with use of the equipment.          Cardiac Rehab from 03/18/2015 in [facility] Cardiac Rehab   Date  03/18/15   Educator  SB   Instruction Review Code  2- meets goals/outcomes      Infection Prevention: - Provides verbal and written material to individual with discussion of infection control including proper hand washing and proper equipment cleaning during exercise session.      Cardiac Rehab from 03/18/2015 in [facility] Cardiac Rehab   Date  03/18/15   Educator  SB   Instruction Review Code  2- meets goals/outcomes      Falls Prevention: - Provides verbal and written material to individual with discussion of falls prevention and safety.      Cardiac Rehab from 03/18/2015 in [facility] Cardiac Rehab   Date  03/18/15   Educator  Sb   Instruction Review Code  2- meets goals/outcomes      Diabetes: - Individual verbal and written instruction to review signs/symptoms of diabetes, desired ranges of glucose level fasting, after meals and with exercise. Advice that pre and post exercise glucose checks will  be done for 3 sessions at entry of program.    Knowledge Questionnaire  Score:     Knowledge Questionnaire Score - 03/18/15 1625    Knowledge Questionnaire Score   Pre Score 23/28      Personal Goals and Risk Factors at Admission:     Personal Goals and Risk Factors at Admission - 03/18/15 0824    Personal Goals and Risk Factors on Admission    Weight Management Yes   Intervention Learn and follow the exercise and diet guidelines while in the program. Utilize the nutrition and education classes to help gain knowledge of the diet and exercise expectations in the program   Increase Aerobic Exercise and Physical Activity Yes;Sedentary   Intervention While in program, learn and follow the exercise prescription taught. Start at a low level workload and increase workload after able to maintain previous level for 30 minutes. Increase time before increasing intensity.   Intervention Provide exercise education and an individualized exercise prescription that will provide continued progressive overload as per policy without signs/symptoms of physical distress.   Diabetes No   Hypertension No   Lipids Yes   Goal Cholesterol controlled with medications as prescribed, with individualized exercise RX and with personalized nutrition plan. Value goals: LDL < 74m, HDL > 416m Participant states understanding of desired cholesterol values and following prescriptions.   Intervention Provide nutrition & aerobic exercise along with prescribed medications to achieve LDL <702mHDL >58m51m Stress Yes   Goal To meet with psychosocial counselor for stress and relaxation information and guidance. To state understanding of performing relaxation techniques and or identifying personal stressors.   Intervention Provide education on types of stress, identifiying stressors, and ways to cope with stress. Provide demonstration and active practice of relaxation techniques.      Personal Goals and Risk Factors Review:      Goals and Risk Factor Review      04/17/15 1253 05/07/15 1414  05/19/15 1529       Increase Aerobic Exercise and Physical Activity   Goals Progress/Improvement seen  No No No     Comments I called EddiPrincess Bruins he said "the exercise is about to kill me". I exercised on Thursday and needed Tuesday to get ready. Now I feel the [place] is taking my strength. MD increased Torsemide but he still have feet swelling,. EddiCharlis Harnerorts that he is going to the Nephologist tomorrow (Kidney doctor). He will check with his MD. I told him that they can bring him down in wheelchair but he really doesn't want to do. Has been out of Cardiac Rehab a while.  Wife called to say EddiEmanualin the hospital but [facility]   Stress   Goal   --  In hospital this past week.         Personal Goals Discharge (Final Personal Goals and Risk Factors Review):      Goals and Risk Factor Review - 05/19/15 1529    Increase Aerobic Exercise and Physical Activity   Goals Progress/Improvement seen  No   Comments Wife called to say EddiSheddrickin the hospital but [facility] Stress   Goal --  In hospital this past week.        Comments: I just called to check on Francisco after his hospitalization and his wife said "we are getting ready to go back in the hospital."

## 2015-05-23 ENCOUNTER — Telehealth: Payer: Self-pay | Admitting: Family Medicine

## 2015-05-23 ENCOUNTER — Inpatient Hospital Stay
Admit: 2015-05-23 | Discharge: 2015-05-23 | Disposition: A | Discharge status: Home / Self Care | Payer: Medicare Other | Attending: Internal Medicine | Admitting: Internal Medicine

## 2015-05-23 LAB — BASIC METABOLIC PANEL
Anion gap: 8 (ref 5–15)
BUN: 41 mg/dL — AB (ref 6–20)
CALCIUM: 7.9 mg/dL — AB (ref 8.9–10.3)
CO2: 22 mmol/L (ref 22–32)
CREATININE: 1.99 mg/dL — AB (ref 0.61–1.24)
Chloride: 99 mmol/L — ABNORMAL LOW (ref 101–111)
GFR calc Af Amer: 35 mL/min — ABNORMAL LOW (ref >60)
GFR, EST NON AFRICAN AMERICAN: 30 mL/min — AB (ref >60)
GLUCOSE: 140 mg/dL — AB (ref 65–99)
POTASSIUM: 3.7 mmol/L (ref 3.5–5.1)
SODIUM: 129 mmol/L — AB (ref 135–145)

## 2015-05-23 MED ORDER — CEFTRIAXONE SODIUM 1 G IJ SOLR
1.0000 g | INTRAMUSCULAR | Status: DC
  Administered 2015-05-23 – 2015-05-26 (×4): 1 g via INTRAVENOUS
  Filled 2015-05-23 (×4): qty 10

## 2015-05-23 MED ORDER — FUROSEMIDE 10 MG/ML IJ SOLN
40.0000 mg | Freq: Two times a day (BID) | INTRAMUSCULAR | Status: DC
Start: 2015-05-23 — End: 2015-05-26
  Administered 2015-05-23 – 2015-05-26 (×5): 40 mg via INTRAVENOUS
  Filled 2015-05-23 (×5): qty 4

## 2015-05-23 MED ORDER — DIGOXIN 125 MCG PO TABS
0.1250 mg | ORAL_TABLET | Freq: Every day | ORAL | Status: DC
  Administered 2015-05-23 – 2015-05-26 (×4): 0.125 mg via ORAL
  Filled 2015-05-23 (×4): qty 1

## 2015-05-23 MED ORDER — POLYETHYLENE GLYCOL 3350 17 G PO PACK
34.0000 g | PACK | Freq: Two times a day (BID) | ORAL | Status: DC
  Administered 2015-05-23 – 2015-05-26 (×6): 34 g via ORAL
  Filled 2015-05-23 (×6): qty 2

## 2015-05-23 MED ORDER — SENNOSIDES-DOCUSATE SODIUM 8.6-50 MG PO TABS
1.0000 | ORAL_TABLET | Freq: Two times a day (BID) | ORAL | Status: DC
  Administered 2015-05-23: 1 via ORAL
  Filled 2015-05-23: qty 1

## 2015-05-23 MED ORDER — DIPHENHYDRAMINE HCL 25 MG PO CAPS
25.0000 mg | ORAL_CAPSULE | Freq: Four times a day (QID) | ORAL | Status: DC | PRN
  Administered 2015-05-23 – 2015-05-25 (×5): 25 mg via ORAL
  Filled 2015-05-23 (×5): qty 1

## 2015-05-23 MED ORDER — SENNOSIDES-DOCUSATE SODIUM 8.6-50 MG PO TABS
2.0000 | ORAL_TABLET | Freq: Two times a day (BID) | ORAL | Status: DC
  Administered 2015-05-23 – 2015-05-26 (×6): 2 via ORAL
  Filled 2015-05-23 (×6): qty 2

## 2015-05-23 NOTE — Progress Notes (Signed)
*  PRELIMINARY RESULTS* Echocardiogram 2D Echocardiogram has been performed.  Jeffrey Gallegos 05/23/2015, 8:51 AM

## 2015-05-23 NOTE — Progress Notes (Signed)
Arrival Method: via stretcher with ED tech Mental Orientation: A&O Telemetry: MX40-05 Skin: verified by Robet Leu, RN. Skin intact- bottom red-non blanchable  IV: 22g right AC, lasix drip @ 80ml/hr Pain:  No pain Tubes: foley in place for urinary retention & aggressive IV diuresis Safety Measures: Safety Fall Prevention Plan has been given, discussed & signed, non skid socks in place, bed alarm activated.  Orders have been reviewed & implemented. Will continue to monitor the patient. Call light & phone are within reach.  Georgian Co, RN

## 2015-05-23 NOTE — Consult Note (Signed)
[facility] Cardiology  CARDIOLOGY CONSULT NOTE  Patient ID: Jeffrey Gallegos MRN: [id number] DOB/AGE: 11-01-1934 80 y.o.  Admit date: 05/22/2015 Referring Physician Manuella Ghazi Primary Physician Fisher Primary Cardiologist Fath Reason for Consultation congestive heart failure  HPI: 80 year old gentleman with known history of coronary artery disease, ischemic cardiomyopathy, chronic systolic congestive heart failure, atrial fibrillation, sick sinus syndrome status post pacemaker, referred for evaluation of acute on chronic systolic congestive heart failure, complicated by chronic kidney disease. Patient has a history of chronic systolic congestive heart failure, with chronic exertional dyspnea and intermittent peripheral edema. Patient has had a recent problem with sitting constipation, underwent recent colonoscopy and endoscopy, and developed rapid fluid retention, peripheral edema and shortness of breath. Patient was treated with furosemide infusion overnight, with diuresis, with clinical improvement. The patient denies chest pain.  Review of systems complete and found to be negative unless listed above     Past Medical History  Diagnosis Date  . History of MI (myocardial infarction) 2011  . History of mumps     Childhood  . Coronary artery disease   . Hypertension   . Sleep apnea   . Thyroid disease     synthroid  . Anemia   . Vitamin D deficiency   . Chronic systolic CHF (congestive heart failure) ([place])   . HLD (hyperlipidemia)   . GERD (gastroesophageal reflux disease)   . A-fib ([place])     on eliquis  . BPH (benign prostatic hyperplasia)   . CKD (chronic kidney disease), stage III     Past Surgical History  Procedure Laterality Date  . Coronary artery bypass graft  11/27/2009    4V at [facility]  . Lasik Bilateral 2002  . Cyst excision Right     Foot  . Leg surgery      Right leg fracture  . Cardiac catheterization    . Electrophysiologic study N/A 01/13/2015    Procedure: CARDIOVERSION;   Surgeon: Teodoro Spray, MD;  Location: [facility] ORS;  Service: Cardiovascular;  Laterality: N/A;  . Esophagogastroduodenoscopy (egd) with propofol N/A 05/19/2015    Procedure: ESOPHAGOGASTRODUODENOSCOPY (EGD) WITH PROPOFOL;  Surgeon: Josefine Class, MD;  Location: [facility] ENDOSCOPY;  Service: Endoscopy;  Laterality: N/A;  . Colonoscopy with propofol N/A 05/19/2015    Procedure: COLONOSCOPY WITH PROPOFOL;  Surgeon: Josefine Class, MD;  Location: [facility] ENDOSCOPY;  Service: Endoscopy;  Laterality: N/A;    Prescriptions prior to admission  Medication Sig Dispense Refill Last Dose  . acetaminophen (TYLENOL) 500 MG tablet Take 500 mg by mouth every 6 (six) hours as needed for mild pain, moderate pain, fever or headache.   PRN  . amiodarone (PACERONE) 200 MG tablet Take 200 mg by mouth daily.    05/22/2015 at am  . atorvastatin (LIPITOR) 80 MG tablet TAKE ONE (1) TABLET BY MOUTH EVERY DAY (Patient taking differently: TAKE ONE (0.5) TABLET BY MOUTH EVERY OTHER NIGHT) 30 tablet 0 05/20/2015 at pm  . carvedilol (COREG) 3.125 MG tablet Take 1 tablet by mouth 2 (two) times daily.  2 05/22/2015 at 0930  . ELIQUIS 5 MG TABS tablet Take 5 mg by mouth 2 (two) times daily.   2 05/22/2015 at am  . levothyroxine (SYNTHROID, LEVOTHROID) 112 MCG tablet Take 1 tablet (112 mcg total) by mouth daily before breakfast. 30 tablet 3 05/22/2015 at am  . nitroGLYCERIN (NITROSTAT) 0.4 MG SL tablet Place 0.4 mg under the tongue every 5 (five) minutes as needed for chest pain.   PRN  .  pantoprazole (PROTONIX) 40 MG tablet Take 1 tablet (40 mg total) by mouth 2 (two) times daily. (Patient taking differently: Take 40 mg by mouth daily. ) 30 tablet 12 05/22/2015 at am  . polyethylene glycol (MIRALAX) packet Take 17 g by mouth daily. 14 each 0 05/22/2015 at am  . Saw Palmetto, Serenoa repens, 450 MG CAPS Take 3 capsules by mouth 2 (two) times daily.    05/22/2015 at am  . spironolactone (ALDACTONE) 25 MG tablet Take 25 mg by mouth  daily.   2 05/22/2015 at am  . tadalafil (CIALIS) 5 MG tablet Take 1 tablet by mouth daily.   05/22/2015 at am  . torsemide (DEMADEX) 10 MG tablet Take 10-20 mg by mouth See admin instructions. 2 tablets every morning and 1 tablet every evening   05/22/2015 at am   Social History   Social History  . Marital Status: Married    Spouse Name: N/A  . Number of Children: 2  . Years of Education: N/A   Occupational History  . Retired     [employer]   Social History Main Topics  . Smoking status: Former Smoker    Types: Cigarettes    Quit date: 07/26/1968  . Smokeless tobacco: Current User    Types: Chew     Comment: Quit smoking in the 1970s  . Alcohol Use: 0.0 oz/week    0 Standard drinks or equivalent per week     Comment: Drinks Building services engineer  . Drug Use: No  . Sexual Activity: Not on file   Other Topics Concern  . Not on file   Social History Narrative    Family History  Problem Relation Age of Onset  . Cancer Mother     Gaspar Cola of death 77  . Heart attack Father     age of death 18  . Heart failure Sister   . Seizures Sister   . Ulcers Sister   . Heart attack Maternal Uncle     age of death 67  . Heart attack Paternal Uncle     age of death 59  . Stroke Sister     age of death 34  . Heart attack Paternal Uncle     age of death 37      Review of systems complete and found to be negative unless listed above      PHYSICAL EXAM  General: Well developed, well nourished, in no acute distress HEENT:  Normocephalic and atramatic Neck:  No JVD.  Lungs: Clear bilaterally to auscultation and percussion. Heart: HRRR . Normal S1 and S2 without gallops or murmurs.  Abdomen: Bowel sounds are positive, abdomen soft and non-tender  Msk:  Back normal, normal gait. Normal strength and tone for age. Extremities: No clubbing, cyanosis or edema.   Neuro: Alert and oriented X 3. Psych:  Good affect, responds appropriately  Labs:   Lab Results  Component Value  Date   WBC 6.6 05/22/2015   HGB 9.1* 05/22/2015   HCT 29.1* 05/22/2015   MCV 82.0 05/22/2015   PLT 104* 05/22/2015    Recent Labs Lab 05/22/15 1335 05/23/15 0403  NA 127* 129*  K 4.6 3.7  CL 96* 99*  CO2 25 22  BUN 42* 41*  CREATININE 2.27* 1.99*  CALCIUM 8.1* 7.9*  PROT 6.3*  --   BILITOT 1.4*  --   ALKPHOS 178*  --   ALT 19  --   AST 34  --   GLUCOSE 130* 140*  Lab Results  Component Value Date   TROPONINI 0.13* 05/22/2015    Lab Results  Component Value Date   CHOL 56 08/26/2014   Lab Results  Component Value Date   HDL 24* 08/26/2014   Lab Results  Component Value Date   LDLCALC 19 08/26/2014   Lab Results  Component Value Date   TRIG 65 08/26/2014   No results found for: CHOLHDL No results found for: LDLDIRECT    Radiology: Ct Abdomen Pelvis Wo Contrast  05/22/2015  CLINICAL DATA:  Complains of constipation. EXAM: CT ABDOMEN AND PELVIS WITHOUT CONTRAST TECHNIQUE: Multidetector CT imaging of the abdomen and pelvis was performed following the standard protocol without IV contrast. COMPARISON:  05/15/2015 FINDINGS: Lower chest: Again noted are biventricular pacer leads. Again noted is a small right pleural fluid. There is a new small left pleural effusion. Small amount of atelectasis at the right lung base. Again noted is enlargement of the heart. Hepatobiliary: There is increased perihepatic ascites. No gross abnormality to the liver but difficult to exclude mild cirrhosis. There are high-density stones in the gallbladder with small amount of fluid around the gallbladder. The gallbladder is not distended. Pancreas: Normal appearance of the pancreas without duct dilatation or inflammation. Spleen: Again noted is perisplenic ascites.  No enlargement. Adrenals/Urinary Tract: Stable fullness of the left adrenal gland. Normal appearance the right adrenal gland. Normal appearance of both kidneys without stones or hydronephrosis. Again noted is mild bladder wall  thickening but the bladder is also not distended. Stomach/Bowel: Large amount of stool in the rectum. Moderate amount of stool throughout most of the colon. Overall, the stool burden in the colon has slightly decreased since 05/15/2015. Oral contrast throughout the small bowel without evidence for dilatation or obstruction. Vascular/Lymphatic: Extensive calcifications involving the abdominal aorta without aneurysm. Iliac arteries are also heavily calcified. No significant lymphadenopathy. Reproductive: No gross abnormality to the prostate or seminal vesicles. Other: The amount of pelvic ascites is similar to the previous examination but there is increased ascites in the upper abdomen, particularly around the liver. In addition, there is increased subcutaneous edema throughout the abdomen and pelvis compared to 05/15/2015. Musculoskeletal: Extensive facet arthropathy at the lumbosacral junction. No acute bone abnormality. IMPRESSION: There continues to be a large amount of stool in the colon but the stool burden has slightly decreased since 05/15/2015. Increased abdominal ascites and subcutaneous edema. There is also a new left pleural effusion. Findings are compatible with a fluid overload state. Difficult to exclude mild cirrhosis. Cholelithiasis without gallbladder distension. Again noted is mild bladder wall thickening which is nonspecific. Electronically Signed   By: Markus Daft M.D.   On: 05/22/2015 17:29   Ct Abdomen Pelvis Wo Contrast  05/15/2015  CLINICAL DATA:  Abdominal pain with constipation for the past 1-2 weeks. Decreased urinary output with increased fluid retention. EXAM: CT ABDOMEN AND PELVIS WITHOUT CONTRAST TECHNIQUE: Multidetector CT imaging of the abdomen and pelvis was performed following the standard protocol without IV contrast. COMPARISON:  None. FINDINGS: The lack of intravenous contrast limits the ability to evaluate solid abdominal organs. There is mild nodularity of the hepatic  contour. There are several layering radiopaque gallstones within otherwise normal-appearing gallbladder. There is a trace amount of perihepatic ascites. Normal noncontrast appearance of the bilateral kidneys. No renal stones. No urinary obstruction or perinephric stranding. Normal noncontrast appearance of bilateral adrenal glands, pancreas and spleen. A small amount of fluid is noted within the left upper abdominal quadrant. Ingested enteric contrast extends to  the level of the cecum. Large colonic stool burden without definite evidence of enteric obstruction. No pneumoperitoneum, pneumatosis or portal venous gas. Large amount of eccentric irregular calcified atherosclerotic plaque within a normal caliber abdominal aorta. No definitive bulky retroperitoneal, mesenteric, pelvic or inguinal lymphadenopathy on this noncontrast examination. There is a small amount of fluid seen within the pelvic cul-de-sac. Normal noncontrast appearance of the urinary bladder given degree distention. Limited visualization of the lower thorax demonstrates cardiomegaly. Pacer leads are seen within the right atrium, ventricle and coronary sinus. There is diffuse decreased attenuation of the intra cardiac blood pool suggestive of anemia. Calcifications within the aortic valve leaflets. No pericardial effusion. Trace right-sided pleural effusion. Minimal dependent subpleural ground-glass atelectasis. No acute or aggressive osseous abnormalities. Mild-to-moderate multilevel lumbar spine DDD, worse at L4-L5 with disc space height loss, endplate irregularity and sclerosis. Mild diffuse body wall anasarca. Small bilateral mesenteric fat containing inguinal hernias. IMPRESSION: 1. Mild nodularity hepatic contour suggestive of mild cirrhotic change. 2. Small volume intra-abdominal ascites. 3. Cholelithiasis without definite evidence of cholecystitis on this noncontrast examination. 4. No evidence of nephrolithiasis or urinary obstruction. 5.  Large colonic stool burden without evidence of enteric obstruction. 6. Cardiomegaly. 7. Suspected anemia. Electronically Signed   By: Sandi Mariscal M.D.   On: 05/15/2015 17:16   Dg Chest 2 View  05/22/2015  CLINICAL DATA:  Fatigue, abdominal pain. EXAM: CHEST  2 VIEW COMPARISON:  05/15/2015 FINDINGS: Cardiac pacemaker, with leads overlying the right atrium, right ventricle and coronary sinus, and postsurgical changes from CABG are stable. Cardiomediastinal silhouette is stably enlarged. Mediastinal contours appear intact. Atherosclerotic calcifications of the aorta noted. There is no evidence of focal airspace consolidation, or pneumothorax. There are small bilateral pleural effusions. Osseous structures are without acute abnormality. Soft tissues are grossly normal. IMPRESSION: Small bilateral pleural effusions. Stable enlargement of the cardiac silhouette. Electronically Signed   By: Fidela Salisbury M.D.   On: 05/22/2015 15:29   Dg Abd Acute W/chest  05/15/2015  CLINICAL DATA:  1-2 week history of constipation decreased urinary output with increased fluid retention and waking. EXAM: DG ABDOMEN ACUTE W/ 1V CHEST COMPARISON:  Chest x-ray 10/21/2009 FINDINGS: The upright chest x-ray demonstrates mild stable cardiac enlargement. There are surgical changes from triple bypass surgery and there are right atrial, right ventricular and coronary sinus pacer wires in good position without complicating features. The lungs are clear. Possible small right effusion. No pulmonary edema. Moderate stool throughout the colon and down into the rectum suggesting constipation. No findings for small bowel obstruction or free air. The soft tissue shadows are maintained the bony structures are intact. Mild scoliosis. IMPRESSION: Mild stable cardiac enlargement but no acute pulmonary findings. Moderate to large amount of stool throughout the colon consistent with constipation. Electronically Signed   By: Marijo Sanes M.D.   On:  05/15/2015 15:29    EKG: Paced rhythm  ASSESSMENT AND PLAN:   1. Acute on chronic systolic congestive heart failure, clinical improvement after initial diuresis, exacerbated by underlying chronic kidney disease 2. CAD, status post CABG, with patent LIMA, patent RIMA, occluded SVG 2 currently without chest pain 3. Paroxysmal atrial fibrillation, status post catheter ablation, chads Vasc of 5, currently on apixaban 4. Sick sinus syndrome, status post dual-chamber pacemaker 5. Chronic kidney disease  Recommendations  1. Agree with overall current therapy 2. Continue diuresis 3. Closely monitor renal function 4. Continue apixaban for stroke prevention 5. Consider nephrology consult 6. Review 2-D echocardiogram   Signed: Kieffer Blatz  MD,PhD, [facility] 05/23/2015, 9:19 AM

## 2015-05-23 NOTE — Telephone Encounter (Signed)
Pt's wife called saying he is in the hospital at [facility] for fluid retention.  Being treated by Dr. Posey Pronto....  She said he has a stye on his eye and wants to know if you can prescribe him something.  She called [facility] and they said erythromycin.  Please advise  Pt. Wife [phone].  Thanks Con Memos

## 2015-05-23 NOTE — Telephone Encounter (Signed)
Spoke with patients wife on the phone who stated that husband was admitted into [facility], this morning patient complained of what appeared to be a possible stye in his eye. Patients wife had brought this to the attention of Dr. Posey Pronto and nurse on call but stated that neither of them were concerned about stye in eye. I tried explaining to wife that if there is a medical issue while patient is under care of a attending physician then it should be addressed then at hospital so he can be treated, I advised that if his eye is still bothering patient to bring it to the attention of nurse and doctor so he can be examined thoroughly. Wife requested that you send in Rx to pharmacy for stye. Please advise. KW

## 2015-05-23 NOTE — Progress Notes (Signed)
[facility]  ROUNDING NOTE   Subjective:  Pt well known to [place]. Followed by Dr. Juleen China as outpt.  Baseline Cr 1.8, egfr 35. Cr was 2.2 upon admission. Down to 1.99 today. Presented with acute systolic heart failure exacerbation, weight increase, LE swelling, DOE.   Objective:  Vital signs in last 24 hours:  Temp:  [97.5 F (36.4 C)-98.4 F (36.9 C)] 97.5 F (36.4 C) (10/28 1141) Pulse Rate:  [58-94] 58 (10/28 1141) Resp:  [16-22] 19 (10/28 1141) BP: (92-109)/(54-74) 92/54 mmHg (10/28 1141) SpO2:  [94 %-100 %] 94 % (10/28 1141) Weight:  [68.947 kg (152 lb)-69.31 kg (152 lb 12.8 oz)] 69.31 kg (152 lb 12.8 oz) (10/28 0500)  Weight change:  Filed Weights   05/22/15 1327 05/22/15 2130 05/23/15 0500  Weight: 68.947 kg (152 lb) 68.947 kg (152 lb) 69.31 kg (152 lb 12.8 oz)    Intake/Output: I/O last 3 completed shifts: In: 191.8 [P.O.:88; I.V.:103.8] Out: 1925 [XLKGM:0102]   Intake/Output this shift:  Total I/O In: 1000 [P.O.:1000] Out: 1060 [Urine:1060]  Physical Exam: General: NAD  Head: Normocephalic, atraumatic. Moist oral mucosal membranes  Eyes: Anicteric, stye left eye  Neck: Supple, trachea midline  Lungs:  Basilar rales, normal effort  Heart: Regular rate and rhythm no rubs  Abdomen:  Soft, nontender, BS present  Extremities:  1+ peripheral edema.  Neurologic: Nonfocal, moving all four extremities  Skin: No lesions       Basic Metabolic Panel:  Recent Labs Lab 05/17/15 0523 05/18/15 0509 05/19/15 0505 05/22/15 1335 05/23/15 0403  NA 130* 133* 133* 127* 129*  K 3.4* 3.3* 3.8 4.6 3.7  CL 96* 100* 99* 96* 99*  CO2 _0 GLUCOSE 128* 156* 116* 130* 140*  BUN 48* 45* 35* 42* 41*  CREATININE 1.95* 1.77* 1.59* 2.27* 1.99*  CALCIUM 8.2* 8.2* 8.2* 8.1* 7.9*    Liver Function Tests:  Recent Labs Lab 05/22/15 1335  AST 34  ALT 19  ALKPHOS 178*  BILITOT 1.4*  PROT 6.3*  ALBUMIN 2.8*    Recent Labs Lab 05/22/15 1335   LIPASE 38   No results for input(s): AMMONIA in the last 168 hours.  CBC:  Recent Labs Lab 05/17/15 0523 05/18/15 0509 05/22/15 1335  WBC  --   --  6.6  NEUTROABS  --   --  5.0  HGB 8.7* 8.5* 9.1*  HCT  --   --  29.1*  MCV  --   --  82.0  PLT  --   --  104*    Cardiac Enzymes:  Recent Labs Lab 05/22/15 1335  TROPONINI 0.13*    BNP: Invalid input(s): POCBNP  CBG:  Recent Labs Lab 05/18/15 2149  GLUCAP 125*    Microbiology: Results for orders placed or performed during the hospital encounter of 05/15/15  KOH prep     Status: None   Collection Time: 05/19/15  2:14 PM  Result Value Ref Range Status   Specimen Description BRONCH BRUSH TIP  Final   Special Requests NONE  Final   KOH Prep NO YEAST OR FUNGAL ELEMENTS SEEN  Final   Report Status 05/19/2015 FINAL  Final    Coagulation Studies:  Recent Labs  05/22/15 1335  LABPROT 25.9*  INR 2.36    Urinalysis:  Recent Labs  05/22/15 1700  COLORURINE YELLOW*  LABSPEC 1.006  PHURINE 6.0  GLUCOSEU NEGATIVE  HGBUR 1+*  BILIRUBINUR NEGATIVE  KETONESUR NEGATIVE  PROTEINUR NEGATIVE  NITRITE NEGATIVE  LEUKOCYTESUR 3+*      Imaging: Ct Abdomen Pelvis Wo Contrast  05/22/2015  CLINICAL DATA:  Complains of constipation. EXAM: CT ABDOMEN AND PELVIS WITHOUT CONTRAST TECHNIQUE: Multidetector CT imaging of the abdomen and pelvis was performed following the standard protocol without IV contrast. COMPARISON:  05/15/2015 FINDINGS: Lower chest: Again noted are biventricular pacer leads. Again noted is a small right pleural fluid. There is a new small left pleural effusion. Small amount of atelectasis at the right lung base. Again noted is enlargement of the heart. Hepatobiliary: There is increased perihepatic ascites. No gross abnormality to the liver but difficult to exclude mild cirrhosis. There are high-density stones in the gallbladder with small amount of fluid around the gallbladder. The gallbladder is not  distended. Pancreas: Normal appearance of the pancreas without duct dilatation or inflammation. Spleen: Again noted is perisplenic ascites.  No enlargement. Adrenals/Urinary Tract: Stable fullness of the left adrenal gland. Normal appearance the right adrenal gland. Normal appearance of both kidneys without stones or hydronephrosis. Again noted is mild bladder wall thickening but the bladder is also not distended. Stomach/Bowel: Large amount of stool in the rectum. Moderate amount of stool throughout most of the colon. Overall, the stool burden in the colon has slightly decreased since 05/15/2015. Oral contrast throughout the small bowel without evidence for dilatation or obstruction. Vascular/Lymphatic: Extensive calcifications involving the abdominal aorta without aneurysm. Iliac arteries are also heavily calcified. No significant lymphadenopathy. Reproductive: No gross abnormality to the prostate or seminal vesicles. Other: The amount of pelvic ascites is similar to the previous examination but there is increased ascites in the upper abdomen, particularly around the liver. In addition, there is increased subcutaneous edema throughout the abdomen and pelvis compared to 05/15/2015. Musculoskeletal: Extensive facet arthropathy at the lumbosacral junction. No acute bone abnormality. IMPRESSION: There continues to be a large amount of stool in the colon but the stool burden has slightly decreased since 05/15/2015. Increased abdominal ascites and subcutaneous edema. There is also a new left pleural effusion. Findings are compatible with a fluid overload state. Difficult to exclude mild cirrhosis. Cholelithiasis without gallbladder distension. Again noted is mild bladder wall thickening which is nonspecific. Electronically Signed   By: Markus Daft M.D.   On: 05/22/2015 17:29   Dg Chest 2 View  05/22/2015  CLINICAL DATA:  Fatigue, abdominal pain. EXAM: CHEST  2 VIEW COMPARISON:  05/15/2015 FINDINGS: Cardiac pacemaker,  with leads overlying the right atrium, right ventricle and coronary sinus, and postsurgical changes from CABG are stable. Cardiomediastinal silhouette is stably enlarged. Mediastinal contours appear intact. Atherosclerotic calcifications of the aorta noted. There is no evidence of focal airspace consolidation, or pneumothorax. There are small bilateral pleural effusions. Osseous structures are without acute abnormality. Soft tissues are grossly normal. IMPRESSION: Small bilateral pleural effusions. Stable enlargement of the cardiac silhouette. Electronically Signed   By: Fidela Salisbury M.D.   On: 05/22/2015 15:29     Medications:     . amiodarone  200 mg Oral Daily  . apixaban  5 mg Oral BID  . atorvastatin  20 mg Oral q1800  . carvedilol  3.125 mg Oral BID  . cefTRIAXone (ROCEPHIN)  IV  1 g Intravenous Q24H  . digoxin  0.125 mg Oral Daily  . furosemide  40 mg Intravenous Q12H  . levothyroxine  112 mcg Oral QAC breakfast  . pantoprazole  40 mg Oral BID  . polyethylene glycol  34 g Oral BID  . senna-docusate  2 tablet Oral BID  .  spironolactone  25 mg Oral Daily  . tamsulosin  0.4 mg Oral Daily   acetaminophen, diphenhydrAMINE, nitroGLYCERIN  Assessment/ Plan:  80 y.o. male  with hypertension, hypothyroidism, diabetes mellitus type 2, hyperlipidemia, GERD, erectile function disorder, BPH, atrial fibrillation, coronary artery disease, systolic congestive heart failure, severe tricuspid regurgitation, admission 23/93/59 for acute systolic heart failure exacerbation.  1. Acute renal failure/Chronic Kidney Disease stage III with proteinuria: seems to be secondary to cardiorenal syndrome.  Baseline Cr 1.8 egfr 35. Presented with acute renal failure with Cr of 2.2, currently down to 1.99 with diuresis.  Continue lasix 75m IV q12 hours for now, follow renal function trend closely.  2.  Acute systolic heart failure exacerbation:  Continue lasix/coreg/digoxin/spironolactone at this time,  follow K closely.    3.  Hyponatremia: due to hypervolemia, Na up to 129, continue lasix at this time.  4.  Anemia of CKD:  May need to consider epogen as outpt if hgb remains low.    5.  Thanks for consult.    LOS: 1 Ada Holness 10/28/20164:00 PM

## 2015-05-23 NOTE — Progress Notes (Signed)
Patient currently has an initial appointment at the [facility] scheduled on May 30, 2015 at 9:00am. Thank you.

## 2015-05-23 NOTE — Care Management (Signed)
Patient assessed by this CM last admission for heart failure.  At that time he and his wife denied any issues accessing medical care, obtaining medications.  He is admitted 1027 after having a colonoscopy 10/24, became more short of breath and significant weight gain- due to heart failure.  Upon admission was placed on lasix drip which has now been changed to IV bid. He has an appointment at the heart failure clinic 11/4.  Patient may benefit from home health follow up with telehealth at this discharge.  Last discharge, home health was not ordered.  Patient and wife did not feel it was it was needed but may be of benefit at this discharge

## 2015-05-23 NOTE — Progress Notes (Signed)
[place]                                                                                                                                                                                            Patient Demographics   Jeffrey Gallegos, is a 80 y.o. male, DOB - Jul 04, 1935, WUG:[id number]  Admit date - 05/22/2015   Admitting Physician [name], MD  Outpatient Primary MD for the patient is [name], MD   LOS - 1  Subjective: Patient known to me from recent hospitalization. At that time he was admitted with severe constipation and was noted to have CHF. Patient was diuresed and also had a EGD and a colonoscopy done on Monday. After the procedure he was discharged home. Patient was noted to have 16 pound weight gain since being discharged he is admitted for CHF. Patient also was noted to have constipation even though he had a colonoscopy prep done a few days prior. He was found to have a anal stricture and was Demyan Dibbles was supposed to follow-up with surgery. He reports that his breathing is improved denies any chest pain     Review of Systems:   CONSTITUTIONAL: No documented fever. No fatigue, weakness. No weight gain, no weight loss.  EYES: No blurry or double vision.  ENT: No tinnitus. No postnasal drip. No redness of the oropharynx.  RESPIRATORY: No cough, no wheeze, no hemoptysis. Positive dyspnea.  CARDIOVASCULAR: No chest pain. No orthopnea. No palpitations. No syncope.  GASTROINTESTINAL: No nausea, no vomiting or diarrhea. No abdominal pain. No melena or hematochezia. C positive constipation GENITOURINARY: No dysuria or hematuria.  ENDOCRINE: No polyuria or nocturia. No heat or cold intolerance.  HEMATOLOGY: No anemia. No bruising. No bleeding.  INTEGUMENTARY: No rashes. No lesions.  MUSCULOSKELETAL: No arthritis. No swelling. No gout.  NEUROLOGIC: No numbness, tingling, or ataxia. No seizure-type activity.  PSYCHIATRIC: No  anxiety. No insomnia. No ADD.    Vitals:   Filed Vitals:   05/23/15 0500 05/23/15 0529 05/23/15 0801 05/23/15 1141  BP:  92/55 95/56 92/54   Pulse:  61 60 58  Temp:  98.4 F (36.9 C) 97.7 F (36.5 C) 97.5 F (36.4 C)  TempSrc:  Oral Oral Oral  Resp:  22 16 19   Height:      Weight: 69.31 kg (152 lb 12.8 oz)     SpO2:  97% 97% 94%    Wt Readings from Last 3 Encounters:  05/23/15 69.31 kg (152 lb 12.8 oz)  05/19/15 69.128 kg (152 lb 6.4 oz)  04/24/15 66.588 kg (146 lb 12.8 oz)  Intake/Output Summary (Last 24 hours) at 05/23/15 1209 Last data filed at 05/23/15 1044  Gross per 24 hour  Intake 431.83 ml  Output   2875 ml  Net -2443.17 ml    Physical Exam:   GENERAL: Pleasant-appearing in no apparent distress.  HEAD, EYES, EARS, NOSE AND THROAT: Atraumatic, normocephalic. Extraocular muscles are intact. Pupils equal and reactive to light. Sclerae anicteric. No conjunctival injection. No oro-pharyngeal erythema.  NECK: Supple. There is no jugular venous distention. No bruits, no lymphadenopathy, no thyromegaly.  HEART: Regular rate and rhythm,. No murmurs, no rubs, no clicks.  LUNGS: Crackles at the bases ABDOMEN: Soft, flat, nontender, nondistended. Has good bowel sounds. No hepatosplenomegaly appreciated.  EXTREMITIES: No evidence of any cyanosis, clubbing, or peripheral edema.  +2 pedal and radial pulses bilaterally.  NEUROLOGIC: The patient is alert, awake, and oriented x3 with no focal motor or sensory deficits appreciated bilaterally.  SKIN: Moist and warm with no rashes appreciated.  Psych: Not anxious, depressed LN: No inguinal LN enlargement    Antibiotics   Anti-infectives    Start     Dose/Rate Route Frequency Ordered Stop   05/23/15 1000  cefTRIAXone (ROCEPHIN) 1 g in dextrose 5 % 50 mL IVPB     1 g 100 mL/hr over 30 Minutes Intravenous Every 24 hours 05/23/15 4332        Medications   Scheduled Meds: . amiodarone  200 mg Oral Daily  . apixaban  5  mg Oral BID  . atorvastatin  20 mg Oral q1800  . carvedilol  3.125 mg Oral BID  . cefTRIAXone (ROCEPHIN)  IV  1 g Intravenous Q24H  . furosemide  40 mg Intravenous Q12H  . levothyroxine  112 mcg Oral QAC breakfast  . pantoprazole  40 mg Oral BID  . polyethylene glycol  17 g Oral Daily  . senna-docusate  1 tablet Oral BID  . spironolactone  25 mg Oral Daily  . tamsulosin  0.4 mg Oral Daily   Continuous Infusions:  PRN Meds:.acetaminophen, diphenhydrAMINE, nitroGLYCERIN   Data Review:   Micro Results Recent Results (from the past 240 hour(s))  KOH prep     Status: None   Collection Time: 05/19/15  2:14 PM  Result Value Ref Range Status   Specimen Description BRONCH BRUSH TIP  Final   Special Requests NONE  Final   KOH Prep NO YEAST OR FUNGAL ELEMENTS SEEN  Final   Report Status 05/19/2015 FINAL  Final    Radiology Reports Ct Abdomen Pelvis Wo Contrast  05/22/2015  CLINICAL DATA:  Complains of constipation. EXAM: CT ABDOMEN AND PELVIS WITHOUT CONTRAST TECHNIQUE: Multidetector CT imaging of the abdomen and pelvis was performed following the standard protocol without IV contrast. COMPARISON:  05/15/2015 FINDINGS: Lower chest: Again noted are biventricular pacer leads. Again noted is a small right pleural fluid. There is a new small left pleural effusion. Small amount of atelectasis at the right lung base. Again noted is enlargement of the heart. Hepatobiliary: There is increased perihepatic ascites. No gross abnormality to the liver but difficult to exclude mild cirrhosis. There are high-density stones in the gallbladder with small amount of fluid around the gallbladder. The gallbladder is not distended. Pancreas: Normal appearance of the pancreas without duct dilatation or inflammation. Spleen: Again noted is perisplenic ascites.  No enlargement. Adrenals/Urinary Tract: Stable fullness of the left adrenal gland. Normal appearance the right adrenal gland. Normal appearance of both kidneys  without stones or hydronephrosis. Again noted is mild bladder wall  thickening but the bladder is also not distended. Stomach/Bowel: Large amount of stool in the rectum. Moderate amount of stool throughout most of the colon. Overall, the stool burden in the colon has slightly decreased since 05/15/2015. Oral contrast throughout the small bowel without evidence for dilatation or obstruction. Vascular/Lymphatic: Extensive calcifications involving the abdominal aorta without aneurysm. Iliac arteries are also heavily calcified. No significant lymphadenopathy. Reproductive: No gross abnormality to the prostate or seminal vesicles. Other: The amount of pelvic ascites is similar to the previous examination but there is increased ascites in the upper abdomen, particularly around the liver. In addition, there is increased subcutaneous edema throughout the abdomen and pelvis compared to 05/15/2015. Musculoskeletal: Extensive facet arthropathy at the lumbosacral junction. No acute bone abnormality. IMPRESSION: There continues to be a large amount of stool in the colon but the stool burden has slightly decreased since 05/15/2015. Increased abdominal ascites and subcutaneous edema. There is also a new left pleural effusion. Findings are compatible with a fluid overload state. Difficult to exclude mild cirrhosis. Cholelithiasis without gallbladder distension. Again noted is mild bladder wall thickening which is nonspecific. Electronically Signed   By: Markus Daft M.D.   On: 05/22/2015 17:29   Ct Abdomen Pelvis Wo Contrast  05/15/2015  CLINICAL DATA:  Abdominal pain with constipation for the past 1-2 weeks. Decreased urinary output with increased fluid retention. EXAM: CT ABDOMEN AND PELVIS WITHOUT CONTRAST TECHNIQUE: Multidetector CT imaging of the abdomen and pelvis was performed following the standard protocol without IV contrast. COMPARISON:  None. FINDINGS: The lack of intravenous contrast limits the ability to evaluate  solid abdominal organs. There is mild nodularity of the hepatic contour. There are several layering radiopaque gallstones within otherwise normal-appearing gallbladder. There is a trace amount of perihepatic ascites. Normal noncontrast appearance of the bilateral kidneys. No renal stones. No urinary obstruction or perinephric stranding. Normal noncontrast appearance of bilateral adrenal glands, pancreas and spleen. A small amount of fluid is noted within the left upper abdominal quadrant. Ingested enteric contrast extends to the level of the cecum. Large colonic stool burden without definite evidence of enteric obstruction. No pneumoperitoneum, pneumatosis or portal venous gas. Large amount of eccentric irregular calcified atherosclerotic plaque within a normal caliber abdominal aorta. No definitive bulky retroperitoneal, mesenteric, pelvic or inguinal lymphadenopathy on this noncontrast examination. There is a small amount of fluid seen within the pelvic cul-de-sac. Normal noncontrast appearance of the urinary bladder given degree distention. Limited visualization of the lower thorax demonstrates cardiomegaly. Pacer leads are seen within the right atrium, ventricle and coronary sinus. There is diffuse decreased attenuation of the intra cardiac blood pool suggestive of anemia. Calcifications within the aortic valve leaflets. No pericardial effusion. Trace right-sided pleural effusion. Minimal dependent subpleural ground-glass atelectasis. No acute or aggressive osseous abnormalities. Mild-to-moderate multilevel lumbar spine DDD, worse at L4-L5 with disc space height loss, endplate irregularity and sclerosis. Mild diffuse body wall anasarca. Small bilateral mesenteric fat containing inguinal hernias. IMPRESSION: 1. Mild nodularity hepatic contour suggestive of mild cirrhotic change. 2. Small volume intra-abdominal ascites. 3. Cholelithiasis without definite evidence of cholecystitis on this noncontrast examination.  4. No evidence of nephrolithiasis or urinary obstruction. 5. Large colonic stool burden without evidence of enteric obstruction. 6. Cardiomegaly. 7. Suspected anemia. Electronically Signed   By: Sandi Mariscal M.D.   On: 05/15/2015 17:16   Dg Chest 2 View  05/22/2015  CLINICAL DATA:  Fatigue, abdominal pain. EXAM: CHEST  2 VIEW COMPARISON:  05/15/2015 FINDINGS: Cardiac pacemaker, with leads overlying  the right atrium, right ventricle and coronary sinus, and postsurgical changes from CABG are stable. Cardiomediastinal silhouette is stably enlarged. Mediastinal contours appear intact. Atherosclerotic calcifications of the aorta noted. There is no evidence of focal airspace consolidation, or pneumothorax. There are small bilateral pleural effusions. Osseous structures are without acute abnormality. Soft tissues are grossly normal. IMPRESSION: Small bilateral pleural effusions. Stable enlargement of the cardiac silhouette. Electronically Signed   By: Fidela Salisbury M.D.   On: 05/22/2015 15:29   Dg Abd Acute W/chest  05/15/2015  CLINICAL DATA:  1-2 week history of constipation decreased urinary output with increased fluid retention and waking. EXAM: DG ABDOMEN ACUTE W/ 1V CHEST COMPARISON:  Chest x-ray 10/21/2009 FINDINGS: The upright chest x-ray demonstrates mild stable cardiac enlargement. There are surgical changes from triple bypass surgery and there are right atrial, right ventricular and coronary sinus pacer wires in good position without complicating features. The lungs are clear. Possible small right effusion. No pulmonary edema. Moderate stool throughout the colon and down into the rectum suggesting constipation. No findings for small bowel obstruction or free air. The soft tissue shadows are maintained the bony structures are intact. Mild scoliosis. IMPRESSION: Mild stable cardiac enlargement but no acute pulmonary findings. Moderate to large amount of stool throughout the colon consistent with  constipation. Electronically Signed   By: Marijo Sanes M.D.   On: 05/15/2015 15:29     CBC  Recent Labs Lab 05/17/15 0523 05/18/15 0509 05/22/15 1335  WBC  --   --  6.6  HGB 8.7* 8.5* 9.1*  HCT  --   --  29.1*  PLT  --   --  104*  MCV  --   --  82.0  MCH  --   --  25.6*  MCHC  --   --  31.2*  RDW  --   --  24.3*  LYMPHSABS  --   --  1.0  MONOABS  --   --  0.6  EOSABS  --   --  0.0  BASOSABS  --   --  0.0    Chemistries   Recent Labs Lab 05/17/15 0523 05/18/15 0509 05/19/15 0505 05/22/15 1335 05/23/15 0403  NA 130* 133* 133* 127* 129*  K 3.4* 3.3* 3.8 4.6 3.7  CL 96* 100* 99* 96* 99*  CO2 25 24 25 25 22   GLUCOSE 128* 156* 116* 130* 140*  BUN 48* 45* 35* 42* 41*  CREATININE 1.95* 1.77* 1.59* 2.27* 1.99*  CALCIUM 8.2* 8.2* 8.2* 8.1* 7.9*  AST  --   --   --  34  --   ALT  --   --   --  19  --   ALKPHOS  --   --   --  178*  --   BILITOT  --   --   --  1.4*  --    ------------------------------------------------------------------------------------------------------------------ estimated creatinine clearance is 30 mL/min (by C-G formula based on Cr of 1.99). ------------------------------------------------------------------------------------------------------------------ No results for input(s): HGBA1C in the last 72 hours. ------------------------------------------------------------------------------------------------------------------ No results for input(s): CHOL, HDL, LDLCALC, TRIG, CHOLHDL, LDLDIRECT in the last 72 hours. ------------------------------------------------------------------------------------------------------------------  Recent Labs  05/22/15 1335  TSH 13.835*   ------------------------------------------------------------------------------------------------------------------ No results for input(s): VITAMINB12, FOLATE, FERRITIN, TIBC, IRON, RETICCTPCT in the last 72 hours.  Coagulation profile  Recent Labs Lab 05/22/15 1335  INR 2.36     No results for input(s): DDIMER in the last 72 hours.  Cardiac Enzymes  Recent Labs Lab 05/22/15 1335  TROPONINI 0.13*   ------------------------------------------------------------------------------------------------------------------ Invalid input(s): POCBNP    Assessment & Plan   80 y.o. male with a known history of chronic systolic CHF, CAD s/p CABG, ICM, afib s/p ablation and pacer came to ED for weight gain of about 16 pounds   # Acute on chronic systolic CHF exacerbation: EF mildly reduced (i.e. 40%) by most recent TTE.  Add digoxin to current regimen, patient treated with IV Lasix drip which has expired. I will change him to Lasix IV twice a day  # Acute on chronic kidney disease stage III: Nephrology consult is pending    #CAD s/p CABG: Severe 3vCAD w/ patent LIMA. RCA occluded at ostium and SVG X 2 occluded as well. One free RIMA graft to PDA had ~30% lesion. Ischemic symptoms by hx over last 2 months. Per cardio notes - Complicated anatomy and no obvious targets for PCI on reviewing recent cath -continue ASA and high-potency statin  #Afib s/p Ablation: PVI w/ Dr. Mylinda Latina 11/2014. Has maintained NSR since that time. NOAC for primary stroke prevention given CHADS2-VASc = 5 suggesting ~7.2% annual risk of stroke. -continue apixaban - renal dose adjustment per pharmacy  #Anemia: likely due to CKD. Monitor.  #Hypothyroidism: Continue Synthroid  #constipation:  Recurrent issue likely related to his anal stricture which has led to a lot of problems will ask surgery to evaluate while in the hospital.      Code Status Orders        Start     Ordered   05/22/15 2112  Full code   Continuous     05/22/15 2111           Consults  Cardiology, nephrology, surgery   DVT Prophylaxis   Eliquis  Lab Results  Component Value Date   PLT 104* 05/22/2015     Time Spent in minutes    15min   Dustin Flock M.D on 05/23/2015 at 12:09 PM  Between 7am to  6pm - Pager - [phone]  After 6pm go to [email or web address] - password [email or web address] [facility]   Office  [phone]

## 2015-05-23 NOTE — Consult Note (Signed)
History and physical exam    Chief complaint is chronic constipation  History of Present Illness: He reports a history of chronic constipation which dates back some 20 years.  He reports that 20 years ago he had a tear in his rectum.  He reports no history of rectal surgery.  He reports that in recent months his problem with constipation is got worse.  He reports a history of taking stool softeners and often having to take enemas  .  He describes having hard stool in his rectum with the urge to move his bowels and have to strain.  He reports having to sit on the commode for an hour or more at a time. I reviewed recent colonoscopy report which was done on October 24. Did have some small polyps removed from his colon.  Did have findings of an anal stricture.  No neoplasm was seen at the site. The patient further notes that he has from time to time put is finger up in the rectum and has felt the stricture and is tried to dilate it himself.  He reports no rectal bleeding.  He has been admitted emergently to the hospital with fluid weight gain consistent with congestive heart failure.  He has been treated with diuretics since admission and has eliminated a large amount of fluid.  Surgery consultation was requested due to an anal stricture  Past Medical History:  I reviewed his history of heart disease including coronary artery disease and myocardial infarction, history of heart surgery and stents  and pacemaker.  I noted his findings of renal insufficiency.  He also has been in the neck and recently received a blood transfusion. Past Medical History  Diagnosis Date  . History of MI (myocardial infarction) 2011  . History of mumps     Childhood  . Coronary artery disease   . Hypertension   . Sleep apnea   . Thyroid disease     synthroid  . Anemia   . Vitamin D deficiency   . Chronic systolic CHF (congestive heart failure) ([place])   . HLD (hyperlipidemia)   . GERD (gastroesophageal reflux disease)    . A-fib ([place])     on eliquis  . BPH (benign prostatic hyperplasia)   . CKD (chronic kidney disease), stage III     Problem List: Patient Active Problem List   Diagnosis Date Noted  . Acute on chronic systolic CHF (congestive heart failure) ([place]) 05/15/2015  . Constipation 05/15/2015  . Anemia 05/15/2015  . Hyponatremia 05/15/2015  . Chronic kidney disease 02/21/2015  . Bradycardia 01/16/2015  . A-fib ([place]) 10/26/2014  . Basal cell carcinoma of skin 10/26/2014  . BPH (benign prostatic hyperplasia) 10/26/2014  . Arteriosclerosis of coronary artery 10/26/2014  . ED (erectile dysfunction) of organic origin 10/26/2014  . Hyperlipidemia 10/26/2014  . BP (high blood pressure) 10/26/2014  . Adult hypothyroidism 10/26/2014  . Diabetes ([place]) 10/26/2014  . Polypharmacy 10/26/2014  . Chronic systolic heart failure ([place]) 09/18/2014  . Cardiomyopathy, ischemic 09/18/2014  . Partial anomalous pulmonary venous connection 09/18/2014  . Disorder of right ventricle of heart 09/18/2014  . Atrial fibrillation, chronic ([place]) 09/18/2014  . Fecal occult blood test positive 08/28/2014  . 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 Benign essential HTN 10/20/2013  . CAD in native artery 10/20/2013  . Severe tricuspid regurgitation 01/24/2012    Past Surgical History: Past Surgical History  Procedure Laterality Date  . Coronary artery bypass graft  11/27/2009  [facility]V [facility]  . Lasik Bilateral 2002  . Cyst excision Right     Foot  . Leg surgery      Right leg fracture  . Cardiac catheterization    . Electrophysiologic study N/A 01/13/2015    Procedure: CARDIOVERSION;  Surgeon: Teodoro Spray, MD;  Location: [facility] ORS;  Service: Cardiovascular;  Laterality: N/A;  . Esophagogastroduodenoscopy (egd) with propofol N/A 05/19/2015    Procedure: ESOPHAGOGASTRODUODENOSCOPY (EGD) WITH PROPOFOL;  Surgeon: Josefine Class, MD;  Location: [facility] ENDOSCOPY;  Service:  Endoscopy;  Laterality: N/A;  . Colonoscopy with propofol N/A 05/19/2015    Procedure: COLONOSCOPY WITH PROPOFOL;  Surgeon: Josefine Class, MD;  Location: [facility] ENDOSCOPY;  Service: Endoscopy;  Laterality: N/A;    Allergies: No Known Allergies  Home Medications: Prescriptions prior to admission  Medication Sig Dispense Refill Last Dose  . acetaminophen (TYLENOL) 500 MG tablet Take 500 mg by mouth every 6 (six) hours as needed for mild pain, moderate pain, fever or headache.   PRN  . amiodarone (PACERONE) 200 MG tablet Take 200 mg by mouth daily.    05/22/2015 at am  . atorvastatin (LIPITOR) 80 MG tablet TAKE ONE (1) TABLET BY MOUTH EVERY DAY (Patient taking differently: TAKE ONE (0.5) TABLET BY MOUTH EVERY OTHER NIGHT) 30 tablet 0 05/20/2015 at pm  . carvedilol (COREG) 3.125 MG tablet Take 1 tablet by mouth 2 (two) times daily.  2 05/22/2015 at 0930  . ELIQUIS 5 MG TABS tablet Take 5 mg by mouth 2 (two) times daily.   2 05/22/2015 at am  . levothyroxine (SYNTHROID, LEVOTHROID) 112 MCG tablet Take 1 tablet (112 mcg total) by mouth daily before breakfast. 30 tablet 3 05/22/2015 at am  . nitroGLYCERIN (NITROSTAT) 0.4 MG SL tablet Place 0.4 mg under the tongue every 5 (five) minutes as needed for chest pain.   PRN  . pantoprazole (PROTONIX) 40 MG tablet Take 1 tablet (40 mg total) by mouth 2 (two) times daily. (Patient taking differently: Take 40 mg by mouth daily. ) 30 tablet 12 05/22/2015 at am  . polyethylene glycol (MIRALAX) packet Take 17 g by mouth daily. 14 each 0 05/22/2015 at am  . Saw [place]metto, Serenoa repens, 450 MG CAPS Take 3 capsules by mouth 2 (two) times daily.    05/22/2015 at am  . spironolactone (ALDACTONE) 25 MG tablet Take 25 mg by mouth daily.   2 05/22/2015 at am  . tadalafil (CIALIS) 5 MG tablet Take 1 tablet by mouth daily.   05/22/2015 at am  . torsemide (DEMADEX) 10 MG tablet Take 10-20 mg by mouth See admin instructions. 2 tablets every morning and 1 tablet every  evening   05/22/2015 at am   Home medication reconciliation was completed with the patient.   Scheduled Inpatient Medications:   . amiodarone  200 mg Oral Daily  . apixaban  5 mg Oral BID  . atorvastatin  20 mg Oral q1800  . carvedilol  3.125 mg Oral BID  . cefTRIAXone (ROCEPHIN)  IV  1 g Intravenous Q24H  . digoxin  0.125 mg Oral Daily  . furosemide  40 mg Intravenous Q12H  . levothyroxine  112 mcg Oral QAC breakfast  . pantoprazole  40 mg Oral BID  . polyethylene glycol  34 g Oral BID  . senna-docusate  2 tablet Oral BID  . spironolactone  25 mg Oral Daily  . tamsulosin  0.4 mg Oral Daily       PRN Inpatient Medications:  acetaminophen, diphenhydrAMINE, nitroGLYCERIN  Family History: family history includes Cancer in his mother; Heart attack in his father, maternal uncle, paternal uncle, and paternal uncle; Heart failure in his sister; Seizures in his sister; Stroke in his sister; Ulcers in his sister.  The patient's family history is negative for inflammatory bowel disorders, GI malignancy, or solid organ transplantation.  Social History:   He is accompanied by his wife.  reports that he quit smoking about 46 years ago. His smoking use included Cigarettes. His smokeless tobacco use includes Chew. He reports that he drinks alcohol. He reports that he does not use illicit drugs. The patient denies ETOH, tobacco, or drug use.   Review of Systems:  He reports no recent acute illness such as cough cold or sore throat.  He has been aware of some swelling of his upper eyelid on the left side.  He reports no recent visual changes.  He reports he does have some chest pain with exertion.  He reports dyspnea with exertion.  He reports no abdominal pain or bloating.  He has had recent development of marked edema of both legs and has been admitted emergently and placed on diuretics and also had a Foley urinary catheter put in.  He reports having sometimes difficulty emptying his bladder.  He  reports no recent sores or boils.  He has had some discomfort over his sacrum when in bed and has had no bit sore but does wear a pad over his sacrum.  He does report weakness and fatigued.  Review of systems otherwise negative   Physical Examination: BP 92/54 mmHg  Pulse 58  Temp(Src) 97.5 F (36.4 C) (Oral)  Resp 19  Ht 5' 8.5" (1.74 m)  Wt 69.31 kg (152 lb 12.8 oz)  BMI 22.89 kg/m2  SpO2 94%    GENERAL:  The patient is awake, alert and oriented, Resting in bed and in no acute distress.  HEENT:    Pupils equal, reactive to light, extraocular movements are intact, sclerae are clear, palpebral conjunctiva normal red color. There is some swelling of the upper eyelid on the left possibly a stye Pharynx clear.  NECK:   Supple without adenopathy or palpable mass.  LUNGS:   Patient is in no respiratory distress.  Lungs are clear without rales rhonchi or wheezes.   HEART:   Regular rhythm,  normal S1-S2 without murmur.There is a palpable lift. He has pacemaker in the left subclavian area.  ABDOMEN:   Nondistended soft and nontender, with no palpable mass, no hepatomegaly.  RECTAL EXAM:External appearance demonstrates no dermatitis.  No fissure was seen.  Digital exam demonstrates good sphincter tone.  There is a stricture which is above the dentate line by approximately 2 cm.  A finger could be put through the stricture with some moderate discomfort.  The stricture   is  Circumferential.  GENITALIA:  Has indwelling Foley urinary catheter  EXTREMITIES: Well developed well-nourished and with trace dependent edema.  NEUROLOGIC:  The patient has a symmetrical facial expression and is awake alert and oriented and moving all extremities.   Data: Lab Results  Component Value Date   WBC 6.6 05/22/2015   HGB 9.1* 05/22/2015   HCT 29.1* 05/22/2015   MCV 82.0 05/22/2015   PLT 104* 05/22/2015    Recent Labs Lab 05/17/15 0523 05/18/15 0509 05/22/15 1335  HGB 8.7* 8.5* 9.1*   Lab  Results  Component Value Date   NA 129* 05/23/2015   K 3.7 05/23/2015   CL  99* 05/23/2015   CO2 22 05/23/2015   BUN 41* 05/23/2015   CREATININE 1.99* 05/23/2015   GLU 103 08/26/2014   Lab Results  Component Value Date   ALT 19 05/22/2015   AST 34 05/22/2015   ALKPHOS 178* 05/22/2015   BILITOT 1.4* 05/22/2015    Recent Labs Lab 05/22/15 1335  INR 2.36   Impression:  Chronic anal stricture of moderate severity Chronic constipation, coronary artery disease, congestive heart failure chronic kidney disease, normocytic anemia , thrombocytopenia   Recommendations: I discussed  with him the option of surgery to correct the anal stricture.  I discussed that he is at high risk for surgery.  He could have  anoplasty under general anesthesia  which typically can be done as outpatient surgery.  I have discussed the operation care risk and benefits with him in detail.  I have also discussed non operative approach which could include a daily digital dilatation done by the patient.  I have also discussed continuing MiraLax, Senokot.   I discussed this case with Dr.Rein who suggested Linzess.  Also discussed having fiber in his diet to help avoid constipation.  If surgery is considered consider a preop hematology consultation to try to correct his anemia which would decrease his operative risk  If he wants to pursue surgery can see in my office for follow-up.  Thank you for the consult. Please call with questions or concerns.  Rochel Brome, MD

## 2015-05-24 LAB — BASIC METABOLIC PANEL
Anion gap: 9 (ref 5–15)
BUN: 37 mg/dL — AB (ref 6–20)
CALCIUM: 8.3 mg/dL — AB (ref 8.9–10.3)
CO2: 25 mmol/L (ref 22–32)
CREATININE: 1.97 mg/dL — AB (ref 0.61–1.24)
Chloride: 99 mmol/L — ABNORMAL LOW (ref 101–111)
GFR calc Af Amer: 36 mL/min — ABNORMAL LOW (ref >60)
GFR, EST NON AFRICAN AMERICAN: 31 mL/min — AB (ref >60)
Glucose, Bld: 112 mg/dL — ABNORMAL HIGH (ref 65–99)
Potassium: 4.1 mmol/L (ref 3.5–5.1)
SODIUM: 133 mmol/L — AB (ref 135–145)

## 2015-05-24 MED ORDER — ERYTHROMYCIN 5 MG/GM OP OINT
TOPICAL_OINTMENT | Freq: Three times a day (TID) | OPHTHALMIC | Status: DC
  Administered 2015-05-24 – 2015-05-26 (×6): 1 via OPHTHALMIC
  Filled 2015-05-24: qty 3.5

## 2015-05-24 MED ORDER — SPIRONOLACTONE 25 MG PO TABS
12.5000 mg | ORAL_TABLET | Freq: Every day | ORAL | Status: DC
  Administered 2015-05-25 – 2015-05-26 (×2): 12.5 mg via ORAL
  Filled 2015-05-24 (×2): qty 1

## 2015-05-24 NOTE — Progress Notes (Signed)
[facility]  SUBJECTIVE: I don't have chest pain   Filed Vitals:   05/24/15 0027 05/24/15 0451 05/24/15 0452 05/24/15 0900  BP: 94/55  91/59 105/59  Pulse: 62  60 58  Temp: 97.3 F (36.3 C)  98.6 F (37 C) 97.6 F (36.4 C)  TempSrc:    Oral  Resp: 17  17 16   Height:      Weight:  71.079 kg (156 lb 11.2 oz)    SpO2: 96%  94% 95%     Intake/Output Summary (Last 24 hours) at 05/24/15 1205 Last data filed at 05/24/15 0945  Gross per 24 hour  Intake    840 ml  Output   1410 ml  Net   -570 ml      PHYSICAL EXAM  General: Well developed, well nourished, in no acute distress HEENT:  Normocephalic and atramatic Neck:  No JVD.  Lungs: Clear bilaterally to auscultation and percussion. Heart: HRRR . Normal S1 and S2 without gallops or murmurs.  Abdomen: Bowel sounds are positive, abdomen soft and non-tender  Msk:  Back normal, normal gait. Normal strength and tone for age. Extremities: No clubbing, cyanosis or edema.   Neuro: Alert and oriented X 3. Psych:  Good affect, responds appropriately   LABS: Basic Metabolic Panel:  Recent Labs  05/23/15 0403 05/24/15 0520  NA 129* 133*  K 3.7 4.1  CL 99* 99*  CO2 22 25  GLUCOSE 140* 112*  BUN 41* 37*  CREATININE 1.99* 1.97*  CALCIUM 7.9* 8.3*   Liver Function Tests:  Recent Labs  05/22/15 1335  AST 34  ALT 19  ALKPHOS 178*  BILITOT 1.4*  PROT 6.3*  ALBUMIN 2.8*    Recent Labs  05/22/15 1335  LIPASE 38   CBC:  Recent Labs  05/22/15 1335  WBC 6.6  NEUTROABS 5.0  HGB 9.1*  HCT 29.1*  MCV 82.0  PLT 104*   Cardiac Enzymes:  Recent Labs  05/22/15 1335  TROPONINI 0.13*   BNP: Invalid input(s): POCBNP D-Dimer: No results for input(s): DDIMER in the last 72 hours. Hemoglobin A1C: No results for input(s): HGBA1C in the last 72 hours. Fasting Lipid Panel: No results for input(s): CHOL, HDL, LDLCALC, TRIG, CHOLHDL, LDLDIRECT in the last 72 hours. Thyroid Function Tests:  Recent Labs   05/22/15 1335  TSH 13.835*   Anemia Panel: No results for input(s): VITAMINB12, FOLATE, FERRITIN, TIBC, IRON, RETICCTPCT in the last 72 hours.  Ct Abdomen Pelvis Wo Contrast  05/22/2015  CLINICAL DATA:  Complains of constipation. EXAM: CT ABDOMEN AND PELVIS WITHOUT CONTRAST TECHNIQUE: Multidetector CT imaging of the abdomen and pelvis was performed following the standard protocol without IV contrast. COMPARISON:  05/15/2015 FINDINGS: Lower chest: Again noted are biventricular pacer leads. Again noted is a small right pleural fluid. There is a new small left pleural effusion. Small amount of atelectasis at the right lung base. Again noted is enlargement of the heart. Hepatobiliary: There is increased perihepatic ascites. No gross abnormality to the liver but difficult to exclude mild cirrhosis. There are high-density stones in the gallbladder with small amount of fluid around the gallbladder. The gallbladder is not distended. Pancreas: Normal appearance of the pancreas without duct dilatation or inflammation. Spleen: Again noted is perisplenic ascites.  No enlargement. Adrenals/Urinary Tract: Stable fullness of the left adrenal gland. Normal appearance the right adrenal gland. Normal appearance of both kidneys without stones or hydronephrosis. Again noted is mild bladder wall thickening but the bladder is also not distended.  Stomach/Bowel: Large amount of stool in the rectum. Moderate amount of stool throughout most of the colon. Overall, the stool burden in the colon has slightly decreased since 05/15/2015. Oral contrast throughout the small bowel without evidence for dilatation or obstruction. Vascular/Lymphatic: Extensive calcifications involving the abdominal aorta without aneurysm. Iliac arteries are also heavily calcified. No significant lymphadenopathy. Reproductive: No gross abnormality to the prostate or seminal vesicles. Other: The amount of pelvic ascites is similar to the previous examination but  there is increased ascites in the upper abdomen, particularly around the liver. In addition, there is increased subcutaneous edema throughout the abdomen and pelvis compared to 05/15/2015. Musculoskeletal: Extensive facet arthropathy at the lumbosacral junction. No acute bone abnormality. IMPRESSION: There continues to be a large amount of stool in the colon but the stool burden has slightly decreased since 05/15/2015. Increased abdominal ascites and subcutaneous edema. There is also a new left pleural effusion. Findings are compatible with a fluid overload state. Difficult to exclude mild cirrhosis. Cholelithiasis without gallbladder distension. Again noted is mild bladder wall thickening which is nonspecific. Electronically Signed   By: Markus Daft M.D.   On: 05/22/2015 17:29   Dg Chest 2 View  05/22/2015  CLINICAL DATA:  Fatigue, abdominal pain. EXAM: CHEST  2 VIEW COMPARISON:  05/15/2015 FINDINGS: Cardiac pacemaker, with leads overlying the right atrium, right ventricle and coronary sinus, and postsurgical changes from CABG are stable. Cardiomediastinal silhouette is stably enlarged. Mediastinal contours appear intact. Atherosclerotic calcifications of the aorta noted. There is no evidence of focal airspace consolidation, or pneumothorax. There are small bilateral pleural effusions. Osseous structures are without acute abnormality. Soft tissues are grossly normal. IMPRESSION: Small bilateral pleural effusions. Stable enlargement of the cardiac silhouette. Electronically Signed   By: Fidela Salisbury M.D.   On: 05/22/2015 15:29     Echo LV EF 45-50%  TELEMETRY: Normal sinus rhythm  ASSESSMENT AND PLAN:  Active Problems:   Acute on chronic systolic CHF (congestive heart failure) ([place])    1. Acute on chronic congestive heart failure, improved after diuresis 2. Mild ischemic cardiomyopathy 3. CAD, status post CABG, without chest pain, 4. Paroxysmal atrial fibrillation, status post catheter  ablation, chads Vascor of 5, on apixaban 5. Sick sinus syndrome, status post dual-chamber pacemaker 6. Chronic kidney disease 7. Anal stricture, low risk for anoplasty  Recommendations  1. Continue current therapy 2. Continue diuresis 3. Closely monitor renal function 4. Continue apixaban for stroke prevention 5. Defer further cardiac diagnostics at this time  Signed off for now, please call if any questions   Audel Coakley, MD, PhD, [facility] 05/24/2015 12:05 PM

## 2015-05-24 NOTE — Progress Notes (Signed)
BP at this time is 94/55, and patient has lasix 40 mg IV to be administered at this time. Dr. Edwina Barth notified and he insist the RN should go ahead and administer the Lasix. Will continue to monitor. No s/s of distress noted.

## 2015-05-24 NOTE — Progress Notes (Signed)
[place] at [facility]                                                                                                                                                                                            Patient Demographics   Jeffrey Gallegos, is a 80 y.o. male, DOB - Dec 12, 1934, WCH:[id number]  Admit date - 05/22/2015   Admitting Physician [name], MD  Outpatient Primary MD for the patient is [name], MD   LOS - 2  Subjective: Noted for acute on chronic systolic heart failure, constipation. Today, he denies any complaints.  Review of Systems:   CONSTITUTIONAL: No documented fever. No fatigue, weakness. No weight gain, no weight loss.  EYES: No blurry or double vision.  ENT: No tinnitus. No postnasal drip. No redness of the oropharynx.  RESPIRATORY: No cough, no wheeze, no hemoptysis. Positive dyspnea.  CARDIOVASCULAR: No chest pain. No orthopnea. No palpitations. No syncope.  GASTROINTESTINAL: No nausea, no vomiting or diarrhea. No abdominal pain. No melena or hematochezia. C positive constipation GENITOURINARY: No dysuria or hematuria.  ENDOCRINE: No polyuria or nocturia. No heat or cold intolerance.  HEMATOLOGY: No anemia. No bruising. No bleeding.  INTEGUMENTARY: No rashes. No lesions.  MUSCULOSKELETAL: No arthritis. No swelling. No gout.  NEUROLOGIC: No numbness, tingling, or ataxia. No seizure-type activity.  PSYCHIATRIC: No anxiety. No insomnia. No ADD.    Vitals:   Filed Vitals:   05/24/15 0451 05/24/15 0452 05/24/15 0900 05/24/15 1214  BP:  91/59 105/59 94/58  Pulse:  60 58 60  Temp:  98.6 F (37 C) 97.6 F (36.4 C) 97.8 F (36.6 C)  TempSrc:   Oral   Resp:  17 16   Height:      Weight: 71.079 kg (156 lb 11.2 oz)     SpO2:  94% 95% 96%    Wt Readings from Last 3 Encounters:  05/24/15 71.079 kg (156 lb 11.2 oz)  05/19/15 69.128 kg (152 lb 6.4 oz)  04/24/15 66.588 kg (146 lb 12.8 oz)     Intake/Output  Summary (Last 24 hours) at 05/24/15 1240 Last data filed at 05/24/15 0945  Gross per 24 hour  Intake    840 ml  Output   1300 ml  Net   -460 ml    Physical Exam:   GENERAL: Pleasant-appearing in no apparent distress.  HEAD, EYES, EARS, NOSE AND THROAT: Atraumatic, normocephalic. Extraocular muscles are intact. Pupils equal and reactive to light. Sclerae anicteric. No conjunctival injection. No oro-pharyngeal erythema.  NECK: Supple. There is no jugular venous distention. No bruits, no lymphadenopathy, no thyromegaly.  HEART: Regular rate and rhythm,. No murmurs, no rubs, no clicks.  LUNGS: Crackles at the bases ABDOMEN: Soft, flat, nontender, nondistended. Has good bowel sounds. No hepatosplenomegaly appreciated.  EXTREMITIES: No evidence of any cyanosis, clubbing, or peripheral edema.  +2 pedal and radial pulses bilaterally.  NEUROLOGIC: The patient is alert, awake, and oriented x3 with no focal motor or sensory deficits appreciated bilaterally.  SKIN: Moist and warm with no rashes appreciated.  Psych: Not anxious, depressed LN: No inguinal LN enlargement    Antibiotics   Anti-infectives    Start     Dose/Rate Route Frequency Ordered Stop   05/23/15 1000  cefTRIAXone (ROCEPHIN) 1 g in dextrose 5 % 50 mL IVPB     1 g 100 mL/hr over 30 Minutes Intravenous Every 24 hours 05/23/15 7408        Medications   Scheduled Meds: . amiodarone  200 mg Oral Daily  . apixaban  5 mg Oral BID  . atorvastatin  20 mg Oral q1800  . carvedilol  3.125 mg Oral BID  . cefTRIAXone (ROCEPHIN)  IV  1 g Intravenous Q24H  . digoxin  0.125 mg Oral Daily  . furosemide  40 mg Intravenous Q12H  . levothyroxine  112 mcg Oral QAC breakfast  . pantoprazole  40 mg Oral BID  . polyethylene glycol  34 g Oral BID  . senna-docusate  2 tablet Oral BID  . spironolactone  25 mg Oral Daily  . tamsulosin  0.4 mg Oral Daily   Continuous Infusions:  PRN Meds:.acetaminophen, diphenhydrAMINE,  nitroGLYCERIN   Data Review:   Micro Results Recent Results (from the past 240 hour(s))  KOH prep     Status: None   Collection Time: 05/19/15  2:14 PM  Result Value Ref Range Status   Specimen Description BRONCH BRUSH TIP  Final   Special Requests NONE  Final   KOH Prep NO YEAST OR FUNGAL ELEMENTS SEEN  Final   Report Status 05/19/2015 FINAL  Final  Urine culture     Status: None (Preliminary result)   Collection Time: 05/23/15  9:59 AM  Result Value Ref Range Status   Specimen Description URINE, CLEAN CATCH  Final   Special Requests NONE  Final   Culture INSIGNIFICANT GROWTH  Final   Report Status PENDING  Incomplete    Radiology Reports Ct Abdomen Pelvis Wo Contrast  05/22/2015  CLINICAL DATA:  Complains of constipation. EXAM: CT ABDOMEN AND PELVIS WITHOUT CONTRAST TECHNIQUE: Multidetector CT imaging of the abdomen and pelvis was performed following the standard protocol without IV contrast. COMPARISON:  05/15/2015 FINDINGS: Lower chest: Again noted are biventricular pacer leads. Again noted is a small right pleural fluid. There is a new small left pleural effusion. Small amount of atelectasis at the right lung base. Again noted is enlargement of the heart. Hepatobiliary: There is increased perihepatic ascites. No gross abnormality to the liver but difficult to exclude mild cirrhosis. There are high-density stones in the gallbladder with small amount of fluid around the gallbladder. The gallbladder is not distended. Pancreas: Normal appearance of the pancreas without duct dilatation or inflammation. Spleen: Again noted is perisplenic ascites.  No enlargement. Adrenals/Urinary Tract: Stable fullness of the left adrenal gland. Normal appearance the right adrenal gland. Normal appearance of both kidneys without stones or hydronephrosis. Again noted is mild bladder wall thickening but the bladder is also not distended. Stomach/Bowel: Large amount of stool in the rectum. Moderate amount of  stool throughout most of the colon. Overall,  the stool burden in the colon has slightly decreased since 05/15/2015. Oral contrast throughout the small bowel without evidence for dilatation or obstruction. Vascular/Lymphatic: Extensive calcifications involving the abdominal aorta without aneurysm. Iliac arteries are also heavily calcified. No significant lymphadenopathy. Reproductive: No gross abnormality to the prostate or seminal vesicles. Other: The amount of pelvic ascites is similar to the previous examination but there is increased ascites in the upper abdomen, particularly around the liver. In addition, there is increased subcutaneous edema throughout the abdomen and pelvis compared to 05/15/2015. Musculoskeletal: Extensive facet arthropathy at the lumbosacral junction. No acute bone abnormality. IMPRESSION: There continues to be a large amount of stool in the colon but the stool burden has slightly decreased since 05/15/2015. Increased abdominal ascites and subcutaneous edema. There is also a new left pleural effusion. Findings are compatible with a fluid overload state. Difficult to exclude mild cirrhosis. Cholelithiasis without gallbladder distension. Again noted is mild bladder wall thickening which is nonspecific. Electronically Signed   By: Markus Daft M.D.   On: 05/22/2015 17:29   Ct Abdomen Pelvis Wo Contrast  05/15/2015  CLINICAL DATA:  Abdominal pain with constipation for the past 1-2 weeks. Decreased urinary output with increased fluid retention. EXAM: CT ABDOMEN AND PELVIS WITHOUT CONTRAST TECHNIQUE: Multidetector CT imaging of the abdomen and pelvis was performed following the standard protocol without IV contrast. COMPARISON:  None. FINDINGS: The lack of intravenous contrast limits the ability to evaluate solid abdominal organs. There is mild nodularity of the hepatic contour. There are several layering radiopaque gallstones within otherwise normal-appearing gallbladder. There is a trace  amount of perihepatic ascites. Normal noncontrast appearance of the bilateral kidneys. No renal stones. No urinary obstruction or perinephric stranding. Normal noncontrast appearance of bilateral adrenal glands, pancreas and spleen. A small amount of fluid is noted within the left upper abdominal quadrant. Ingested enteric contrast extends to the level of the cecum. Large colonic stool burden without definite evidence of enteric obstruction. No pneumoperitoneum, pneumatosis or portal venous gas. Large amount of eccentric irregular calcified atherosclerotic plaque within a normal caliber abdominal aorta. No definitive bulky retroperitoneal, mesenteric, pelvic or inguinal lymphadenopathy on this noncontrast examination. There is a small amount of fluid seen within the pelvic cul-de-sac. Normal noncontrast appearance of the urinary bladder given degree distention. Limited visualization of the lower thorax demonstrates cardiomegaly. Pacer leads are seen within the right atrium, ventricle and coronary sinus. There is diffuse decreased attenuation of the intra cardiac blood pool suggestive of anemia. Calcifications within the aortic valve leaflets. No pericardial effusion. Trace right-sided pleural effusion. Minimal dependent subpleural ground-glass atelectasis. No acute or aggressive osseous abnormalities. Mild-to-moderate multilevel lumbar spine DDD, worse at L4-L5 with disc space height loss, endplate irregularity and sclerosis. Mild diffuse body wall anasarca. Small bilateral mesenteric fat containing inguinal hernias. IMPRESSION: 1. Mild nodularity hepatic contour suggestive of mild cirrhotic change. 2. Small volume intra-abdominal ascites. 3. Cholelithiasis without definite evidence of cholecystitis on this noncontrast examination. 4. No evidence of nephrolithiasis or urinary obstruction. 5. Large colonic stool burden without evidence of enteric obstruction. 6. Cardiomegaly. 7. Suspected anemia. Electronically  Signed   By: Sandi Mariscal M.D.   On: 05/15/2015 17:16   Dg Chest 2 View  05/22/2015  CLINICAL DATA:  Fatigue, abdominal pain. EXAM: CHEST  2 VIEW COMPARISON:  05/15/2015 FINDINGS: Cardiac pacemaker, with leads overlying the right atrium, right ventricle and coronary sinus, and postsurgical changes from CABG are stable. Cardiomediastinal silhouette is stably enlarged. Mediastinal contours appear intact. Atherosclerotic calcifications  of the aorta noted. There is no evidence of focal airspace consolidation, or pneumothorax. There are small bilateral pleural effusions. Osseous structures are without acute abnormality. Soft tissues are grossly normal. IMPRESSION: Small bilateral pleural effusions. Stable enlargement of the cardiac silhouette. Electronically Signed   By: Fidela Salisbury M.D.   On: 05/22/2015 15:29   Dg Abd Acute W/chest  05/15/2015  CLINICAL DATA:  1-2 week history of constipation decreased urinary output with increased fluid retention and waking. EXAM: DG ABDOMEN ACUTE W/ 1V CHEST COMPARISON:  Chest x-ray 10/21/2009 FINDINGS: The upright chest x-ray demonstrates mild stable cardiac enlargement. There are surgical changes from triple bypass surgery and there are right atrial, right ventricular and coronary sinus pacer wires in good position without complicating features. The lungs are clear. Possible small right effusion. No pulmonary edema. Moderate stool throughout the colon and down into the rectum suggesting constipation. No findings for small bowel obstruction or free air. The soft tissue shadows are maintained the bony structures are intact. Mild scoliosis. IMPRESSION: Mild stable cardiac enlargement but no acute pulmonary findings. Moderate to large amount of stool throughout the colon consistent with constipation. Electronically Signed   By: Marijo Sanes M.D.   On: 05/15/2015 15:29     CBC  Recent Labs Lab 05/18/15 0509 05/22/15 1335  WBC  --  6.6  HGB 8.5* 9.1*  HCT  --   29.1*  PLT  --  104*  MCV  --  82.0  MCH  --  25.6*  MCHC  --  31.2*  RDW  --  24.3*  LYMPHSABS  --  1.0  MONOABS  --  0.6  EOSABS  --  0.0  BASOSABS  --  0.0    Chemistries   Recent Labs Lab 05/18/15 0509 05/19/15 0505 05/22/15 1335 05/23/15 0403 05/24/15 0520  NA 133* 133* 127* 129* 133*  K 3.3* 3.8 4.6 3.7 4.1  CL 100* 99* 96* 99* 99*  CO2 24 25 25 22 25   GLUCOSE 156* 116* 130* 140* 112*  BUN 45* 35* 42* 41* 37*  CREATININE 1.77* 1.59* 2.27* 1.99* 1.97*  CALCIUM 8.2* 8.2* 8.1* 7.9* 8.3*  AST  --   --  34  --   --   ALT  --   --  19  --   --   ALKPHOS  --   --  178*  --   --   BILITOT  --   --  1.4*  --   --    ------------------------------------------------------------------------------------------------------------------ estimated creatinine clearance is 30.4 mL/min (by C-G formula based on Cr of 1.97). ------------------------------------------------------------------------------------------------------------------ No results for input(s): HGBA1C in the last 72 hours. ------------------------------------------------------------------------------------------------------------------ No results for input(s): CHOL, HDL, LDLCALC, TRIG, CHOLHDL, LDLDIRECT in the last 72 hours. ------------------------------------------------------------------------------------------------------------------  Recent Labs  05/22/15 1335  TSH 13.835*   ------------------------------------------------------------------------------------------------------------------ No results for input(s): VITAMINB12, FOLATE, FERRITIN, TIBC, IRON, RETICCTPCT in the last 72 hours.  Coagulation profile  Recent Labs Lab 05/22/15 1335  INR 2.36    No results for input(s): DDIMER in the last 72 hours.  Cardiac Enzymes  Recent Labs Lab 05/22/15 1335  TROPONINI 0.13*   ------------------------------------------------------------------------------------------------------------------ Invalid  input(s): POCBNP    Assessment & Plan   80 y.o. male with a known history of chronic systolic CHF, CAD s/p CABG, ICM, afib s/p ablation and pacer came to ED for weight gain of about 16 pounds   # Acute on chronic systolic CHF exacerbation: EF mildly reduced (i.e. 40%) by most  recent TTE.  During IV Lasix, and liquids, digoxin, Coreg. Aldactone. # Acute on chronic kidney disease stage III: seen  by nephrology. Function stable.  #CAD s/p CABG: Severe 3vCAD w/ patent LIMA. RCA occluded at ostium and SVG X 2 occluded as well. One free RIMA graft to PDA had ~30% lesion. Ischemic symptoms by hx over last 2 months. Per cardio notes - Complicated anatomy and no obvious targets for PCI on reviewing recent cath -continue ASA and high-potency statin,  #Afib s/p Ablation: PVI w/ Dr. Mylinda Latina 11/2014. Has maintained NSR since that time. NOAC for primary stroke prevention given CHADS2-VASc = 5 suggesting ~7.2% annual risk of stroke. -continue apixaban - renal dose adjustment per pharmacy  #Anemia: likely due to CKD. Monitor.  #Hypothyroidism: Continue Synthroid  #constipation:  Recurrent issue likely related to his anal stricture which has led to a lot of problems , and he recommends outpatient surgery for possible anaplastic. Continue MiraLAX, Senokot, ranges, high fiber diet.     Code Status Orders        Start     Ordered   05/22/15 2112  Full code   Continuous     05/22/15 2111           Consults  Cardiology, nephrology, surgery   DVT Prophylaxis   Eliquis  Lab Results  Component Value Date   PLT 104* 05/22/2015     Time Spent in minutes    31min   Zyliah Schier M.D on 05/24/2015 at 12:40 PM  Between 7am to 6pm - Pager - [phone]  After 6pm go to [email or web address] - password [email or web address] [facility]   Office  [phone]

## 2015-05-24 NOTE — Progress Notes (Signed)
Patient's BP=92/51 and has carvedilol 3.125mg  to be administered at this time. DR. Jannifer Franklin notified with a new order to hold the carvedilol tonight. No acute distress noted. Patient denies pain.

## 2015-05-24 NOTE — Progress Notes (Signed)
[facility]  ROUNDING NOTE   Subjective:   Wife at bedside.  UOP 2360 Furosemide 58m IV q12.   Objective:  Vital signs in last 24 hours:  Temp:  [97.3 F (36.3 C)-98.6 F (37 C)] 97.8 F (36.6 C) (10/29 1214) Pulse Rate:  [58-63] 60 (10/29 1214) Resp:  [16-17] 16 (10/29 0900) BP: (91-105)/(55-60) 94/58 mmHg (10/29 1214) SpO2:  [94 %-97 %] 96 % (10/29 1214) Weight:  [71.079 kg (156 lb 11.2 oz)] 71.079 kg (156 lb 11.2 oz) (10/29 0451)  Weight change: 2.132 kg (4 lb 11.2 oz) Filed Weights   05/22/15 2130 05/23/15 0500 05/24/15 0451  Weight: 68.947 kg (152 lb) 69.31 kg (152 lb 12.8 oz) 71.079 kg (156 lb 11.2 oz)    Intake/Output: I/O last 3 completed shifts: In: 1481.8 [P.O.:1328; I.V.:103.8; IV Piggyback:50] Out: 45784[Urine:4285]   Intake/Output this shift:  Total I/O In: 840 [P.O.:840] Out: 350 [Urine:350]  Physical Exam: General: NAD  Head: Normocephalic, atraumatic. Moist oral mucosal membranes  Eyes: Anicteric, stye left eye +  Neck: Supple, trachea midline  Lungs:  Basilar rales, normal effort  Heart: Regular rate and rhythm no rubs  Abdomen:  Soft, nontender, BS present  Extremities:  1+ peripheral and dependent edema.  Neurologic: Nonfocal, moving all four extremities  Skin: No lesions       Basic Metabolic Panel:  Recent Labs Lab 05/18/15 0509 05/19/15 0505 05/22/15 1335 05/23/15 0403 05/24/15 0520  NA 133* 133* 127* 129* 133*  K 3.3* 3.8 4.6 3.7 4.1  CL 100* 99* 96* 99* 99*  CO2 24 25 25 22 25   GLUCOSE 156* 116* 130* 140* 112*  BUN 45* 35* 42* 41* 37*  CREATININE 1.77* 1.59* 2.27* 1.99* 1.97*  CALCIUM 8.2* 8.2* 8.1* 7.9* 8.3*    Liver Function Tests:  Recent Labs Lab 05/22/15 1335  AST 34  ALT 19  ALKPHOS 178*  BILITOT 1.4*  PROT 6.3*  ALBUMIN 2.8*    Recent Labs Lab 05/22/15 1335  LIPASE 38   No results for input(s): AMMONIA in the last 168 hours.  CBC:  Recent Labs Lab 05/18/15 0509 05/22/15 1335   WBC  --  6.6  NEUTROABS  --  5.0  HGB 8.5* 9.1*  HCT  --  29.1*  MCV  --  82.0  PLT  --  104*    Cardiac Enzymes:  Recent Labs Lab 05/22/15 1335  TROPONINI 0.13*    BNP: Invalid input(s): POCBNP  CBG:  Recent Labs Lab 05/18/15 2149  GLUCAP 125*    Microbiology: Results for orders placed or performed during the hospital encounter of 05/22/15  Urine culture     Status: None (Preliminary result)   Collection Time: 05/23/15  9:59 AM  Result Value Ref Range Status   Specimen Description URINE, CLEAN CATCH  Final   Special Requests NONE  Final   Culture INSIGNIFICANT GROWTH  Final   Report Status PENDING  Incomplete    Coagulation Studies:  Recent Labs  05/22/15 1335  LABPROT 25.9*  INR 2.36    Urinalysis:  Recent Labs  05/22/15 1700  COLORURINE YELLOW*  LABSPEC 1.006  PHURINE 6.0  GLUCOSEU NEGATIVE  HGBUR 1+*  BILIRUBINUR NEGATIVE  KETONESUR NEGATIVE  PROTEINUR NEGATIVE  NITRITE NEGATIVE  LEUKOCYTESUR 3+*      Imaging: Ct Abdomen Pelvis Wo Contrast  05/22/2015  CLINICAL DATA:  Complains of constipation. EXAM: CT ABDOMEN AND PELVIS WITHOUT CONTRAST TECHNIQUE: Multidetector CT imaging of the abdomen and pelvis was  performed following the standard protocol without IV contrast. COMPARISON:  05/15/2015 FINDINGS: Lower chest: Again noted are biventricular pacer leads. Again noted is a small right pleural fluid. There is a new small left pleural effusion. Small amount of atelectasis at the right lung base. Again noted is enlargement of the heart. Hepatobiliary: There is increased perihepatic ascites. No gross abnormality to the liver but difficult to exclude mild cirrhosis. There are high-density stones in the gallbladder with small amount of fluid around the gallbladder. The gallbladder is not distended. Pancreas: Normal appearance of the pancreas without duct dilatation or inflammation. Spleen: Again noted is perisplenic ascites.  No enlargement.  Adrenals/Urinary Tract: Stable fullness of the left adrenal gland. Normal appearance the right adrenal gland. Normal appearance of both kidneys without stones or hydronephrosis. Again noted is mild bladder wall thickening but the bladder is also not distended. Stomach/Bowel: Large amount of stool in the rectum. Moderate amount of stool throughout most of the colon. Overall, the stool burden in the colon has slightly decreased since 05/15/2015. Oral contrast throughout the small bowel without evidence for dilatation or obstruction. Vascular/Lymphatic: Extensive calcifications involving the abdominal aorta without aneurysm. Iliac arteries are also heavily calcified. No significant lymphadenopathy. Reproductive: No gross abnormality to the prostate or seminal vesicles. Other: The amount of pelvic ascites is similar to the previous examination but there is increased ascites in the upper abdomen, particularly around the liver. In addition, there is increased subcutaneous edema throughout the abdomen and pelvis compared to 05/15/2015. Musculoskeletal: Extensive facet arthropathy at the lumbosacral junction. No acute bone abnormality. IMPRESSION: There continues to be a large amount of stool in the colon but the stool burden has slightly decreased since 05/15/2015. Increased abdominal ascites and subcutaneous edema. There is also a new left pleural effusion. Findings are compatible with a fluid overload state. Difficult to exclude mild cirrhosis. Cholelithiasis without gallbladder distension. Again noted is mild bladder wall thickening which is nonspecific. Electronically Signed   By: Markus Daft M.D.   On: 05/22/2015 17:29   Dg Chest 2 View  05/22/2015  CLINICAL DATA:  Fatigue, abdominal pain. EXAM: CHEST  2 VIEW COMPARISON:  05/15/2015 FINDINGS: Cardiac pacemaker, with leads overlying the right atrium, right ventricle and coronary sinus, and postsurgical changes from CABG are stable. Cardiomediastinal silhouette is  stably enlarged. Mediastinal contours appear intact. Atherosclerotic calcifications of the aorta noted. There is no evidence of focal airspace consolidation, or pneumothorax. There are small bilateral pleural effusions. Osseous structures are without acute abnormality. Soft tissues are grossly normal. IMPRESSION: Small bilateral pleural effusions. Stable enlargement of the cardiac silhouette. Electronically Signed   By: Fidela Salisbury M.D.   On: 05/22/2015 15:29     Medications:     . amiodarone  200 mg Oral Daily  . apixaban  5 mg Oral BID  . atorvastatin  20 mg Oral q1800  . carvedilol  3.125 mg Oral BID  . cefTRIAXone (ROCEPHIN)  IV  1 g Intravenous Q24H  . digoxin  0.125 mg Oral Daily  . furosemide  40 mg Intravenous Q12H  . levothyroxine  112 mcg Oral QAC breakfast  . pantoprazole  40 mg Oral BID  . polyethylene glycol  34 g Oral BID  . senna-docusate  2 tablet Oral BID  . [START ON 05/25/2015] spironolactone  12.5 mg Oral Daily  . tamsulosin  0.4 mg Oral Daily   acetaminophen, diphenhydrAMINE, nitroGLYCERIN  Assessment/ Plan:  80 y.o. white male with hypertension, hypothyroidism, diabetes mellitus type 2, hyperlipidemia,  GERD, erectile function disorder, BPH, atrial fibrillation, coronary artery disease, systolic congestive heart failure, severe tricuspid regurgitation, admission 06/99/96 for acute systolic heart failure exacerbation.  1. Acute renal failure/Chronic Kidney Disease stage III with proteinuria: seems to be secondary to cardiorenal syndrome.  Baseline Cr 1.8 egfr 35 04/23/2015 Presented with acute renal failure with Cr of 2.2 due to acute cardiorenal syndrome. - continue furosemide and monitor volume status.   2.  Acute systolic heart failure exacerbation:  Continue lasix/coreg/digoxin/spironolactone at this time, follow K closely.    3.  Hyponatremia: due to hypervolemia, Na up to 133, continue lasix at this time.  4.  Anemia of CKD:  May need to consider  epogen, hemoglobin 9.1   - will need GI work up.     LOS: [place], Jeric Slagel 10/29/20162:07 PM

## 2015-05-25 LAB — URINE CULTURE

## 2015-05-25 LAB — CBC
HCT: 27 % — ABNORMAL LOW (ref 40.0–52.0)
HEMOGLOBIN: 8.6 g/dL — AB (ref 13.0–18.0)
MCH: 25.9 pg — AB (ref 26.0–34.0)
MCHC: 31.8 g/dL — ABNORMAL LOW (ref 32.0–36.0)
MCV: 81.6 fL (ref 80.0–100.0)
Platelets: 104 10*3/uL — ABNORMAL LOW (ref 150–440)
RBC: 3.31 MIL/uL — AB (ref 4.40–5.90)
RDW: 24.9 % — ABNORMAL HIGH (ref 11.5–14.5)
WBC: 5.5 10*3/uL (ref 3.8–10.6)

## 2015-05-25 LAB — BASIC METABOLIC PANEL
Anion gap: 8 (ref 5–15)
BUN: 31 mg/dL — ABNORMAL HIGH (ref 6–20)
CALCIUM: 8.1 mg/dL — AB (ref 8.9–10.3)
CO2: 25 mmol/L (ref 22–32)
Chloride: 99 mmol/L — ABNORMAL LOW (ref 101–111)
Creatinine, Ser: 1.73 mg/dL — ABNORMAL HIGH (ref 0.61–1.24)
GFR, EST AFRICAN AMERICAN: 42 mL/min — AB (ref >60)
GFR, EST NON AFRICAN AMERICAN: 36 mL/min — AB (ref >60)
Glucose, Bld: 160 mg/dL — ABNORMAL HIGH (ref 65–99)
Potassium: 4 mmol/L (ref 3.5–5.1)
Sodium: 132 mmol/L — ABNORMAL LOW (ref 135–145)

## 2015-05-25 NOTE — Progress Notes (Signed)
BP=95/55 and HR=60, Carvedilol 3.125 mg held per Dr. Jannifer Franklin order. Patient is alert and oriented. No acute distress noted. Will continue to monitor.

## 2015-05-25 NOTE — Progress Notes (Signed)
Lasix 40 mg on hold due to low BP=94/62 per Dr. Jannifer Franklin order.

## 2015-05-25 NOTE — Progress Notes (Signed)
Initial Nutrition Assessment    INTERVENTION:   Meals and Snacks: Cater to patient preferences Education: pt recently admitted, at that time pt and wife verbalized understanding of low sodium/heart healthy diet and had no questions at that time. Will reinforce diet on follow-up   NUTRITION DIAGNOSIS:   Reassess on follow  GOAL:   Patient will meet greater than or equal to 90% of their needs   MONITOR:    (Energy Intake, Anthropometrics, Electrolyte/Renal Profile, Knowledge)  REASON FOR ASSESSMENT:   Diagnosis    ASSESSMENT:    Pt admitted with acute on chronic CHF with hyponatremia, acute on CKD, constipation with anal stricture  Past Medical History  Diagnosis Date  . History of MI (myocardial infarction) 2011  . History of mumps     Childhood  . Coronary artery disease   . Hypertension   . Sleep apnea   . Thyroid disease     synthroid  . Anemia   . Vitamin D deficiency   . Chronic systolic CHF (congestive heart failure) ([place])   . HLD (hyperlipidemia)   . GERD (gastroesophageal reflux disease)   . A-fib ([place])     on eliquis  . BPH (benign prostatic hyperplasia)   . CKD (chronic kidney disease), stage III      Diet Order:  Diet Heart Room service appropriate?: Yes; Fluid consistency:: Thin   Energy Intake: recorded po intake 70-100% of meals  Electrolyte and Renal Profile:  Recent Labs Lab 05/23/15 0403 05/24/15 0520 05/25/15 0517  BUN 41* 37* 31*  CREATININE 1.99* 1.97* 1.73*  NA 129* 133* 132*  K 3.7 4.1 4.0   Glucose Profile: No results for input(s): GLUCAP in the last 72 hours.  Meds: lasix  Height:   Ht Readings from Last 1 Encounters:  05/22/15 5' 8.5" (1.74 m)    Weight:   Wt Readings from Last 1 Encounters:  05/25/15 155 lb 4.8 oz (70.444 kg)   Filed Weights   05/23/15 0500 05/24/15 0451 05/25/15 0500  Weight: 152 lb 12.8 oz (69.31 kg) 156 lb 11.2 oz (71.079 kg) 155 lb 4.8 oz (70.444 kg)    BMI:  Body mass index is  23.27 kg/(m^2).  LOW Care Level  Kerman Passey MS, [place], LDN [phone] Pager

## 2015-05-25 NOTE — Progress Notes (Signed)
[place] at [facility]                                                                                                                                                                                            Patient Demographics   Jeffrey Gallegos, is a 79 y.o. male, DOB - 05-Jun-1935, QHU:[id number]  Admit date - 05/22/2015   Admitting Physician [name], MD  Outpatient Primary MD for the patient is [name], MD   LOS - 3  Subjective: Seen today, patient says that he feels  short of breath with minimal exertion.  Review of Systems:   CONSTITUTIONAL: No documented fever. No fatigue, weakness. No weight gain, no weight loss.  EYES: No blurry or double vision.  ENT: No tinnitus. No postnasal drip. No redness of the oropharynx.  RESPIRATORY: No cough, no wheeze, no hemoptysis. Positive dyspnea.  CARDIOVASCULAR: No chest pain. No orthopnea. No palpitations. No syncope.  GASTROINTESTINAL: No nausea, no vomiting or diarrhea. No abdominal pain. No melena or hematochezia. C positive constipation GENITOURINARY: No dysuria or hematuria.  ENDOCRINE: No polyuria or nocturia. No heat or cold intolerance.  HEMATOLOGY: No anemia. No bruising. No bleeding.  INTEGUMENTARY: No rashes. No lesions.  MUSCULOSKELETAL: No arthritis. No swelling. No gout.  NEUROLOGIC: No numbness, tingling, or ataxia. No seizure-type activity.  PSYCHIATRIC: No anxiety. No insomnia. No ADD.    Vitals:   Filed Vitals:   05/24/15 2020 05/25/15 0006 05/25/15 0500 05/25/15 0557  BP: 92/51 94/62  98/55  Pulse: 57 61  60  Temp: 98.6 F (37 C)   98.1 F (36.7 C)  TempSrc:      Resp: 16 17  19   Height:      Weight:   70.444 kg (155 lb 4.8 oz)   SpO2: 99% 95%  96%    Wt Readings from Last 3 Encounters:  05/25/15 70.444 kg (155 lb 4.8 oz)  05/19/15 69.128 kg (152 lb 6.4 oz)  04/24/15 66.588 kg (146 lb 12.8 oz)     Intake/Output Summary (Last 24 hours) at 05/25/15  0738 Last data filed at 05/25/15 0600  Gross per 24 hour  Intake   1080 ml  Output   1800 ml  Net   -720 ml    Physical Exam:   GENERAL: Pleasant-appearing in no apparent distress.  HEAD, EYES, EARS, NOSE AND THROAT: Atraumatic, normocephalic. Extraocular muscles are intact. Pupils equal and reactive to light. Sclerae anicteric. No conjunctival injection. No oro-pharyngeal erythema.  NECK: Supple. There is no jugular venous distention. No bruits, no lymphadenopathy, no thyromegaly.  HEART: Regular rate and  rhythm,. No murmurs, no rubs, no clicks.  LUNGS: Crackles at the bases ABDOMEN: Soft, flat, nontender, nondistended. Has good bowel sounds. No hepatosplenomegaly appreciated.  EXTREMITIES: No evidence of any cyanosis, clubbing, or peripheral edema.  +2 pedal and radial pulses bilaterally.  NEUROLOGIC: The patient is alert, awake, and oriented x3 with no focal motor or sensory deficits appreciated bilaterally.  SKIN: Moist and warm with no rashes appreciated.  Psych: Not anxious, depressed LN: No inguinal LN enlargement    Antibiotics   Anti-infectives    Start     Dose/Rate Route Frequency Ordered Stop   05/23/15 1000  cefTRIAXone (ROCEPHIN) 1 g in dextrose 5 % 50 mL IVPB     1 g 100 mL/hr over 30 Minutes Intravenous Every 24 hours 05/23/15 4982        Medications   Scheduled Meds: . amiodarone  200 mg Oral Daily  . apixaban  5 mg Oral BID  . atorvastatin  20 mg Oral q1800  . carvedilol  3.125 mg Oral BID  . cefTRIAXone (ROCEPHIN)  IV  1 g Intravenous Q24H  . digoxin  0.125 mg Oral Daily  . erythromycin   Left Eye 3 times per day  . furosemide  40 mg Intravenous Q12H  . levothyroxine  112 mcg Oral QAC breakfast  . pantoprazole  40 mg Oral BID  . polyethylene glycol  34 g Oral BID  . senna-docusate  2 tablet Oral BID  . spironolactone  12.5 mg Oral Daily  . tamsulosin  0.4 mg Oral Daily   Continuous Infusions:  PRN Meds:.acetaminophen, diphenhydrAMINE,  nitroGLYCERIN   Data Review:   Micro Results Recent Results (from the past 240 hour(s))  KOH prep     Status: None   Collection Time: 05/19/15  2:14 PM  Result Value Ref Range Status   Specimen Description BRONCH BRUSH TIP  Final   Special Requests NONE  Final   KOH Prep NO YEAST OR FUNGAL ELEMENTS SEEN  Final   Report Status 05/19/2015 FINAL  Final  Urine culture     Status: None (Preliminary result)   Collection Time: 05/23/15  9:59 AM  Result Value Ref Range Status   Specimen Description URINE, CLEAN CATCH  Final   Special Requests NONE  Final   Culture INSIGNIFICANT GROWTH  Final   Report Status PENDING  Incomplete    Radiology Reports Ct Abdomen Pelvis Wo Contrast  05/22/2015  CLINICAL DATA:  Complains of constipation. EXAM: CT ABDOMEN AND PELVIS WITHOUT CONTRAST TECHNIQUE: Multidetector CT imaging of the abdomen and pelvis was performed following the standard protocol without IV contrast. COMPARISON:  05/15/2015 FINDINGS: Lower chest: Again noted are biventricular pacer leads. Again noted is a small right pleural fluid. There is a new small left pleural effusion. Small amount of atelectasis at the right lung base. Again noted is enlargement of the heart. Hepatobiliary: There is increased perihepatic ascites. No gross abnormality to the liver but difficult to exclude mild cirrhosis. There are high-density stones in the gallbladder with small amount of fluid around the gallbladder. The gallbladder is not distended. Pancreas: Normal appearance of the pancreas without duct dilatation or inflammation. Spleen: Again noted is perisplenic ascites.  No enlargement. Adrenals/Urinary Tract: Stable fullness of the left adrenal gland. Normal appearance the right adrenal gland. Normal appearance of both kidneys without stones or hydronephrosis. Again noted is mild bladder wall thickening but the bladder is also not distended. Stomach/Bowel: Large amount of stool in the rectum. Moderate amount of  stool throughout most of the colon. Overall, the stool burden in the colon has slightly decreased since 05/15/2015. Oral contrast throughout the small bowel without evidence for dilatation or obstruction. Vascular/Lymphatic: Extensive calcifications involving the abdominal aorta without aneurysm. Iliac arteries are also heavily calcified. No significant lymphadenopathy. Reproductive: No gross abnormality to the prostate or seminal vesicles. Other: The amount of pelvic ascites is similar to the previous examination but there is increased ascites in the upper abdomen, particularly around the liver. In addition, there is increased subcutaneous edema throughout the abdomen and pelvis compared to 05/15/2015. Musculoskeletal: Extensive facet arthropathy at the lumbosacral junction. No acute bone abnormality. IMPRESSION: There continues to be a large amount of stool in the colon but the stool burden has slightly decreased since 05/15/2015. Increased abdominal ascites and subcutaneous edema. There is also a new left pleural effusion. Findings are compatible with a fluid overload state. Difficult to exclude mild cirrhosis. Cholelithiasis without gallbladder distension. Again noted is mild bladder wall thickening which is nonspecific. Electronically Signed   By: Markus Daft M.D.   On: 05/22/2015 17:29   Ct Abdomen Pelvis Wo Contrast  05/15/2015  CLINICAL DATA:  Abdominal pain with constipation for the past 1-2 weeks. Decreased urinary output with increased fluid retention. EXAM: CT ABDOMEN AND PELVIS WITHOUT CONTRAST TECHNIQUE: Multidetector CT imaging of the abdomen and pelvis was performed following the standard protocol without IV contrast. COMPARISON:  None. FINDINGS: The lack of intravenous contrast limits the ability to evaluate solid abdominal organs. There is mild nodularity of the hepatic contour. There are several layering radiopaque gallstones within otherwise normal-appearing gallbladder. There is a trace  amount of perihepatic ascites. Normal noncontrast appearance of the bilateral kidneys. No renal stones. No urinary obstruction or perinephric stranding. Normal noncontrast appearance of bilateral adrenal glands, pancreas and spleen. A small amount of fluid is noted within the left upper abdominal quadrant. Ingested enteric contrast extends to the level of the cecum. Large colonic stool burden without definite evidence of enteric obstruction. No pneumoperitoneum, pneumatosis or portal venous gas. Large amount of eccentric irregular calcified atherosclerotic plaque within a normal caliber abdominal aorta. No definitive bulky retroperitoneal, mesenteric, pelvic or inguinal lymphadenopathy on this noncontrast examination. There is a small amount of fluid seen within the pelvic cul-de-sac. Normal noncontrast appearance of the urinary bladder given degree distention. Limited visualization of the lower thorax demonstrates cardiomegaly. Pacer leads are seen within the right atrium, ventricle and coronary sinus. There is diffuse decreased attenuation of the intra cardiac blood pool suggestive of anemia. Calcifications within the aortic valve leaflets. No pericardial effusion. Trace right-sided pleural effusion. Minimal dependent subpleural ground-glass atelectasis. No acute or aggressive osseous abnormalities. Mild-to-moderate multilevel lumbar spine DDD, worse at L4-L5 with disc space height loss, endplate irregularity and sclerosis. Mild diffuse body wall anasarca. Small bilateral mesenteric fat containing inguinal hernias. IMPRESSION: 1. Mild nodularity hepatic contour suggestive of mild cirrhotic change. 2. Small volume intra-abdominal ascites. 3. Cholelithiasis without definite evidence of cholecystitis on this noncontrast examination. 4. No evidence of nephrolithiasis or urinary obstruction. 5. Large colonic stool burden without evidence of enteric obstruction. 6. Cardiomegaly. 7. Suspected anemia. Electronically  Signed   By: Sandi Mariscal M.D.   On: 05/15/2015 17:16   Dg Chest 2 View  05/22/2015  CLINICAL DATA:  Fatigue, abdominal pain. EXAM: CHEST  2 VIEW COMPARISON:  05/15/2015 FINDINGS: Cardiac pacemaker, with leads overlying the right atrium, right ventricle and coronary sinus, and postsurgical changes from CABG are stable. Cardiomediastinal silhouette is stably  enlarged. Mediastinal contours appear intact. Atherosclerotic calcifications of the aorta noted. There is no evidence of focal airspace consolidation, or pneumothorax. There are small bilateral pleural effusions. Osseous structures are without acute abnormality. Soft tissues are grossly normal. IMPRESSION: Small bilateral pleural effusions. Stable enlargement of the cardiac silhouette. Electronically Signed   By: Fidela Salisbury M.D.   On: 05/22/2015 15:29   Dg Abd Acute W/chest  05/15/2015  CLINICAL DATA:  1-2 week history of constipation decreased urinary output with increased fluid retention and waking. EXAM: DG ABDOMEN ACUTE W/ 1V CHEST COMPARISON:  Chest x-ray 10/21/2009 FINDINGS: The upright chest x-ray demonstrates mild stable cardiac enlargement. There are surgical changes from triple bypass surgery and there are right atrial, right ventricular and coronary sinus pacer wires in good position without complicating features. The lungs are clear. Possible small right effusion. No pulmonary edema. Moderate stool throughout the colon and down into the rectum suggesting constipation. No findings for small bowel obstruction or free air. The soft tissue shadows are maintained the bony structures are intact. Mild scoliosis. IMPRESSION: Mild stable cardiac enlargement but no acute pulmonary findings. Moderate to large amount of stool throughout the colon consistent with constipation. Electronically Signed   By: Marijo Sanes M.D.   On: 05/15/2015 15:29     CBC  Recent Labs Lab 05/22/15 1335  WBC 6.6  HGB 9.1*  HCT 29.1*  PLT 104*  MCV 82.0   MCH 25.6*  MCHC 31.2*  RDW 24.3*  LYMPHSABS 1.0  MONOABS 0.6  EOSABS 0.0  BASOSABS 0.0    Chemistries   Recent Labs Lab 05/19/15 0505 05/22/15 1335 05/23/15 0403 05/24/15 0520 05/25/15 0517  NA 133* 127* 129* 133* 132*  K 3.8 4.6 3.7 4.1 4.0  CL 99* 96* 99* 99* 99*  CO2 25 25 22 25 25   GLUCOSE 116* 130* 140* 112* 160*  BUN 35* 42* 41* 37* 31*  CREATININE 1.59* 2.27* 1.99* 1.97* 1.73*  CALCIUM 8.2* 8.1* 7.9* 8.3* 8.1*  AST  --  34  --   --   --   ALT  --  19  --   --   --   ALKPHOS  --  178*  --   --   --   BILITOT  --  1.4*  --   --   --    ------------------------------------------------------------------------------------------------------------------ estimated creatinine clearance is 34.6 mL/min (by C-G formula based on Cr of 1.73). ------------------------------------------------------------------------------------------------------------------ No results for input(s): HGBA1C in the last 72 hours. ------------------------------------------------------------------------------------------------------------------ No results for input(s): CHOL, HDL, LDLCALC, TRIG, CHOLHDL, LDLDIRECT in the last 72 hours. ------------------------------------------------------------------------------------------------------------------  Recent Labs  05/22/15 1335  TSH 13.835*   ------------------------------------------------------------------------------------------------------------------ No results for input(s): VITAMINB12, FOLATE, FERRITIN, TIBC, IRON, RETICCTPCT in the last 72 hours.  Coagulation profile  Recent Labs Lab 05/22/15 1335  INR 2.36    No results for input(s): DDIMER in the last 72 hours.  Cardiac Enzymes  Recent Labs Lab 05/22/15 1335  TROPONINI 0.13*   ------------------------------------------------------------------------------------------------------------------ Invalid input(s): POCBNP    Assessment & Plan   80 y.o. male with a known  history of chronic systolic CHF, CAD s/p CABG, ICM, afib s/p ablation and pacer came to ED for weight gain of about 16 pounds   # Acute on chronic systolic CHF exacerbation: EF mildly reduced (i.e. 40%) by most recent TTE. Continue IV Lasix today. Continue Coreg, Aldactone, # Acute on chronic kidney disease stage III: seen  by nephrology. Renal function now is improving.  #CAD s/p  CABG: Severe 3vCAD w/ patent LIMA. RCA occluded at ostium and SVG X 2 occluded as well. One free RIMA graft to PDA had ~30% lesion. Ischemic symptoms by hx over last 2 months. Per cardio notes - Complicated anatomy and no obvious targets for PCI on reviewing recent cath -continue ASA and high-potency statin,  #Afib s/p Ablation: PVI w/ Dr. Mylinda Latina 11/2014. Has maintained NSR since that time. NOAC for primary stroke prevention given CHADS2-VASc = 5 suggesting ~7.2% annual risk of stroke. -continue apixaban - renal dose adjustment per pharmacy. Continue amiodarone, digoxin, and coreg  #Anemia: likely due to CKD. Monitor.  #Hypothyroidism: Continue Synthroid  #constipation:  Recurrent issue likely related to his anal stricture which has led to a lot of problems , and he recommends outpatient surgery for possible anaplastic. Continue MiraLAX, Senokot, ranges, high fiber diet.  UTI: Has cystitis, urine cultures no growth. Receiving Rocephin. Deconditioning physical therapy evaluation today.     Code Status Orders        Start     Ordered   05/22/15 2112  Full code   Continuous     05/22/15 2111           Consults  Cardiology, nephrology, surgery   DVT Prophylaxis   Eliquis  Lab Results  Component Value Date   PLT 104* 05/22/2015     Time Spent in minutes    67min   Kyel Purk M.D on 05/25/2015 at 7:38 AM  Between 7am to 6pm - Pager - [phone]  After 6pm go to [email or web address] - password [email or web address] [facility]   Office  [phone]

## 2015-05-25 NOTE — Progress Notes (Signed)
Physical Therapy Evaluation Patient Details Name: Jeffrey Gallegos MRN: [id number] DOB: 1935-04-17 Today's Date: 05/25/2015   History of Present Illness  Patient is a 80 y.o. male who presented to ED on 27 Oct. for 16# weight gain in 2 weeks due to constipation/urinary retention. PMH includes chronic systolic CHF, CAD s/p CABG, atrial fibrillation s/p ablation, CKD III, and ICM.  Clinical Impression  Patient is a pleasant male who lives at home with wife. Previously, patient required assistance from wife for most ADLs and ambulated with SPC. Patient was unsteady with HHA in standing but showed improved balance with RW. Because patient has generalized weakness and decreased activity tolerance, it is believed that he will benefit from PT f/u throughout hospital stay and once d/c home to improve mobility and function.    Follow Up Recommendations Home health PT    Equipment Recommendations  Rolling walker with 5" wheels    Recommendations for Other Services       Precautions / Restrictions Precautions Precautions: None Restrictions Weight Bearing Restrictions: No      Mobility  Bed Mobility Overal bed mobility: Needs Assistance Bed Mobility: Supine to Sit     Supine to sit: Min assist     General bed mobility comments: Patient moves from supine to sit with minimal assistance getting to EOB.  Transfers Overall transfer level: Modified independent Equipment used: Rolling walker (2 wheeled)             General transfer comment: Patient performed sit to stand with modified independence. Was slightly unsteady, so RW utilized. Required verbal cues for proper use.  Ambulation/Gait Ambulation/Gait assistance: Min guard Ambulation Distance (Feet): 30 Feet Assistive device: Rolling walker (2 wheeled) Gait Pattern/deviations: Wide base of support     General Gait Details: Patient ambulates at decreased cadence with wide BOS. Patient required verbal cues to keep RW closer,  having tendency to push too far in front. Improved steadiness with RW.  Stairs            Wheelchair Mobility    Modified Rankin (Stroke Patients Only)       Balance Overall balance assessment: Modified Independent                                           Pertinent Vitals/Pain Pain Assessment: No/denies pain    Home Living Family/patient expects to be discharged to:: Private residence Living Arrangements: Spouse/significant other Available Help at Discharge: Family Type of Home: House Home Access: Stairs to enter Entrance Stairs-Rails: Can reach both Entrance Stairs-Number of Steps: 4 Home Layout: One level Home Equipment: Cane - single point      Prior Function Level of Independence: Needs assistance   Gait / Transfers Assistance Needed: Patient was a household ambulator with [place]. No hx of falls.  ADL's / Homemaking Assistance Needed: Performed by wife        Hand Dominance        Extremity/Trunk Assessment   Upper Extremity Assessment: Overall WFL for tasks assessed           Lower Extremity Assessment: Generalized weakness         Communication   Communication: No difficulties  Cognition Arousal/Alertness: Awake/alert Behavior During Therapy: WFL for tasks assessed/performed Overall Cognitive Status: Within Functional Limits for tasks assessed  General Comments      Exercises        Assessment/Plan    PT Assessment Patient needs continued PT services  PT Diagnosis Generalized weakness   PT Problem List Decreased strength;Decreased activity tolerance;Decreased balance;Decreased mobility;Decreased knowledge of use of DME;Decreased safety awareness  PT Treatment Interventions DME instruction;Gait training;Stair training;Functional mobility training;Therapeutic activities;Therapeutic exercise;Balance training;Patient/family education   PT Goals (Current goals can be found in the Care  Plan section) Acute Rehab PT Goals Patient Stated Goal: "To decrease swelling in legs" PT Goal Formulation: With patient/family Time For Goal Achievement: 06/08/15 Potential to Achieve Goals: Good    Frequency Min 2X/week   Barriers to discharge        Co-evaluation               End of Session Equipment Utilized During Treatment: Gait belt Activity Tolerance: Patient tolerated treatment well Patient left: in chair;with call bell/phone within reach;with family/visitor present Nurse Communication: Mobility status         Time: 1050-1115 PT Time Calculation (min) (ACUTE ONLY): 25 min   Charges:   PT Evaluation $Initial PT Evaluation Tier I: 1 Procedure     PT G Codes:        Dorice Lamas, PT, DPT 05/25/2015, 11:32 AM

## 2015-05-25 NOTE — Progress Notes (Signed)
[facility]  ROUNDING NOTE   Subjective:   Complains of scrotal edema Creatinine back to baseline. UOP 1800 Furosemide 66m IV q12  Objective:  Vital signs in last 24 hours:  Temp:  [97.6 F (36.4 C)-98.6 F (37 C)] 97.6 F (36.4 C) (10/30 0800) Pulse Rate:  [57-61] 59 (10/30 0800) Resp:  [16-19] 16 (10/30 0800) BP: (92-98)/(46-62) 98/46 mmHg (10/30 0800) SpO2:  [95 %-99 %] 97 % (10/30 0800) Weight:  [70.444 kg (155 lb 4.8 oz)] 70.444 kg (155 lb 4.8 oz) (10/30 0500)  Weight change: -0.635 kg (-1 lb 6.4 oz) Filed Weights   05/23/15 0500 05/24/15 0451 05/25/15 0500  Weight: 69.31 kg (152 lb 12.8 oz) 71.079 kg (156 lb 11.2 oz) 70.444 kg (155 lb 4.8 oz)    Intake/Output: I/O last 3 completed shifts: In: 1080 [P.O.:1080] Out: 2600 [Urine:2600]   Intake/Output this shift:     Physical Exam: General: NAD  Head: Normocephalic, atraumatic. Moist oral mucosal membranes  Eyes: Anicteric, stye left eye +  Neck: Supple, trachea midline  Lungs:  Basilar rales, normal effort  Heart: Regular rate and rhythm no rubs  Abdomen:  Soft, nontender, BS present  Extremities:  1+ peripheral and dependent edema.  Neurologic: Nonfocal, moving all four extremities  Skin: No lesions       Basic Metabolic Panel:  Recent Labs Lab 05/19/15 0505 05/22/15 1335 05/23/15 0403 05/24/15 0520 05/25/15 0517  NA 133* 127* 129* 133* 132*  K 3.8 4.6 3.7 4.1 4.0  CL 99* 96* 99* 99* 99*  CO2 25 25 22 25 25   GLUCOSE 116* 130* 140* 112* 160*  BUN 35* 42* 41* 37* 31*  CREATININE 1.59* 2.27* 1.99* 1.97* 1.73*  CALCIUM 8.2* 8.1* 7.9* 8.3* 8.1*    Liver Function Tests:  Recent Labs Lab 05/22/15 1335  AST 34  ALT 19  ALKPHOS 178*  BILITOT 1.4*  PROT 6.3*  ALBUMIN 2.8*    Recent Labs Lab 05/22/15 1335  LIPASE 38   No results for input(s): AMMONIA in the last 168 hours.  CBC:  Recent Labs Lab 05/22/15 1335 05/25/15 0517  WBC 6.6 5.5  NEUTROABS 5.0  --   HGB  9.1* 8.6*  HCT 29.1* 27.0*  MCV 82.0 81.6  PLT 104* 104*    Cardiac Enzymes:  Recent Labs Lab 05/22/15 1335  TROPONINI 0.13*    BNP: Invalid input(s): POCBNP  CBG:  Recent Labs Lab 05/18/15 2149  GLUCAP 125*    Microbiology: Results for orders placed or performed during the hospital encounter of 05/22/15  Urine culture     Status: None (Preliminary result)   Collection Time: 05/23/15  9:59 AM  Result Value Ref Range Status   Specimen Description URINE, CLEAN CATCH  Final   Special Requests NONE  Final   Culture INSIGNIFICANT GROWTH  Final   Report Status PENDING  Incomplete    Coagulation Studies:  Recent Labs  05/22/15 1335  LABPROT 25.9*  INR 2.36    Urinalysis:  Recent Labs  05/22/15 1700  COLORURINE YELLOW*  LABSPEC 1.006  PHURINE 6.0  GLUCOSEU NEGATIVE  HGBUR 1+*  BILIRUBINUR NEGATIVE  KETONESUR NEGATIVE  PROTEINUR NEGATIVE  NITRITE NEGATIVE  LEUKOCYTESUR 3+*      Imaging: No results found.   Medications:     . amiodarone  200 mg Oral Daily  . apixaban  5 mg Oral BID  . atorvastatin  20 mg Oral q1800  . carvedilol  3.125 mg Oral BID  .  cefTRIAXone (ROCEPHIN)  IV  1 g Intravenous Q24H  . digoxin  0.125 mg Oral Daily  . erythromycin   Left Eye 3 times per day  . furosemide  40 mg Intravenous Q12H  . levothyroxine  112 mcg Oral QAC breakfast  . pantoprazole  40 mg Oral BID  . polyethylene glycol  34 g Oral BID  . senna-docusate  2 tablet Oral BID  . spironolactone  12.5 mg Oral Daily  . tamsulosin  0.4 mg Oral Daily   acetaminophen, diphenhydrAMINE, nitroGLYCERIN  Assessment/ Plan:  80 y.o. white male with hypertension, hypothyroidism, diabetes mellitus type 2, hyperlipidemia, GERD, erectile function disorder, BPH, atrial fibrillation, coronary artery disease, systolic congestive heart failure, severe tricuspid regurgitation, admission 12/37/99 for acute systolic heart failure exacerbation.  1. Acute renal failure/Chronic  Kidney Disease stage III with proteinuria: seems to be secondary to cardiorenal syndrome.  Baseline Cr 1.8 egfr 35 04/23/2015 Presented with acute renal failure with Cr of 2.2 due to acute cardiorenal syndrome. - continue furosemide and monitor volume status.   2.  Acute systolic heart failure exacerbation:  Continue lasix/coreg/digoxin/spironolactone at this time, follow K closely.    3.  Hyponatremia: due to hypervolemia from congestive heart failure, Na up to 132, continue lasix at this time. Baseline 136 on 04/23/2015  4.  Anemia of CKD:  May need to consider epogen, hemoglobin 8.6 - will need GI work up.     LOS: 3 Jeffrey Gallegos 10/30/20169:39 AM

## 2015-05-26 MED ORDER — DIGOXIN 125 MCG PO TABS: 0.1250 mg | ORAL_TABLET | Freq: Every day | ORAL | Status: DC

## 2015-05-26 MED ORDER — TAMSULOSIN HCL 0.4 MG PO CAPS: 0.4000 mg | ORAL_CAPSULE | Freq: Every day | ORAL | Status: DC

## 2015-05-26 MED ORDER — FUROSEMIDE 40 MG PO TABS: 40.0000 mg | ORAL_TABLET | Freq: Two times a day (BID) | ORAL | Status: DC

## 2015-05-26 MED ORDER — ERYTHROMYCIN 5 MG/GM OP OINT
TOPICAL_OINTMENT | Freq: Three times a day (TID) | OPHTHALMIC | Status: DC
Start: 2015-05-26 — End: 2015-09-15

## 2015-05-26 NOTE — Progress Notes (Signed)
BP=98/57, Lasix 40 mg IV administered as scheduled  per Dr. Tinnie Gens order. Issues noted.

## 2015-05-26 NOTE — Care Management (Signed)
Patient is for discharge home today and is in agreement with home health nursing and physical therapy.  [facility] is in network with patient's insurance.  Physical therapy is recommending front wheeled rolling walker.  Patient and his wife confirm that they have access to this type walker.  Spoke with attending to obtain order for home health physical therapy, nursing and telehealth and face to face.  Referral called to Rhett Bannister with [facility].

## 2015-05-26 NOTE — Discharge Instructions (Signed)
Heart Failure Clinic appointment on May 30, 2015 at 9:00am with Darylene Price, [place]. Please call [phone] to reschedule.   [facility].  NURSE, [place] CALL YOU TO SCHEDULE A HOME VISIT WITHIN 24 HOURS.  [phone]

## 2015-05-26 NOTE — Care Management Important Message (Signed)
Important Message  Patient Details  Name: DEANDREW HOECKER MRN: [id number] Date of Birth: 1935-01-16   Medicare Important Message Given:  Yes-second notification given    Juliann Pulse A Latrecia Capito 05/26/2015, 11:34 AM

## 2015-05-26 NOTE — Progress Notes (Signed)
[facility]  ROUNDING NOTE   Subjective:   Overall feels better S Cr down to 1.73 UOP 3250 cc   Objective:  Vital signs in last 24 hours:  Temp:  [97.6 F (36.4 C)-98.3 F (36.8 C)] 98.3 F (36.8 C) (10/31 0538) Pulse Rate:  [59-61] 60 (10/31 0846) Resp:  [17-22] 17 (10/31 0846) BP: (95-110)/(55-68) 104/58 mmHg (10/31 0846) SpO2:  [95 %-99 %] 99 % (10/31 0846) Weight:  [69.99 kg (154 lb 4.8 oz)] 69.99 kg (154 lb 4.8 oz) (10/31 0500)  Weight change: -0.454 kg (-1 lb) Filed Weights   05/24/15 0451 05/25/15 0500 05/26/15 0500  Weight: 71.079 kg (156 lb 11.2 oz) 70.444 kg (155 lb 4.8 oz) 69.99 kg (154 lb 4.8 oz)    Intake/Output: I/O last 3 completed shifts: In: 1440 [P.O.:1440] Out: 4300 [Urine:4300]   Intake/Output this shift:  Total I/O In: 240 [P.O.:240] Out: 300 [Urine:300]  Physical Exam: General: NAD  Head: Normocephalic, atraumatic. Moist oral mucosal membranes  Eyes: Anicteric, stye left eye +  Neck: Supple, trachea midline  Lungs:  Basilar rales, normal effort  Heart: Regular rate and rhythm no rubs  Abdomen:  Soft, nontender, BS present  Extremities:  1+ peripheral and dependent edema.  Neurologic: Nonfocal, moving all four extremities  Skin: No lesions   foley    Basic Metabolic Panel:  Recent Labs Lab 05/22/15 1335 05/23/15 0403 05/24/15 0520 05/25/15 0517  NA 127* 129* 133* 132*  K 4.6 3.7 4.1 4.0  CL 96* 99* 99* 99*  CO2 _0 GLUCOSE 130* 140* 112* 160*  BUN 42* 41* 37* 31*  CREATININE 2.27* 1.99* 1.97* 1.73*  CALCIUM 8.1* 7.9* 8.3* 8.1*    Liver Function Tests:  Recent Labs Lab 05/22/15 1335  AST 34  ALT 19  ALKPHOS 178*  BILITOT 1.4*  PROT 6.3*  ALBUMIN 2.8*    Recent Labs Lab 05/22/15 1335  LIPASE 38   No results for input(s): AMMONIA in the last 168 hours.  CBC:  Recent Labs Lab 05/22/15 1335 05/25/15 0517  WBC 6.6 5.5  NEUTROABS 5.0  --   HGB 9.1* 8.6*  HCT 29.1* 27.0*  MCV 82.0  81.6  PLT 104* 104*    Cardiac Enzymes:  Recent Labs Lab 05/22/15 1335  TROPONINI 0.13*    BNP: Invalid input(s): POCBNP  CBG: No results for input(s): GLUCAP in the last 168 hours.  Microbiology: Results for orders placed or performed during the hospital encounter of 05/22/15  Urine culture     Status: None   Collection Time: 05/23/15  9:59 AM  Result Value Ref Range Status   Specimen Description URINE, CLEAN CATCH  Final   Special Requests NONE  Final   Culture INSIGNIFICANT GROWTH  Final   Report Status 05/25/2015 FINAL  Final    Coagulation Studies: No results for input(s): LABPROT, INR in the last 72 hours.  Urinalysis: No results for input(s): COLORURINE, LABSPEC, PHURINE, GLUCOSEU, HGBUR, BILIRUBINUR, KETONESUR, PROTEINUR, UROBILINOGEN, NITRITE, LEUKOCYTESUR in the last 72 hours.  Invalid input(s): APPERANCEUR    Imaging: No results found.   Medications:     . amiodarone  200 mg Oral Daily  . apixaban  5 mg Oral BID  . atorvastatin  20 mg Oral q1800  . carvedilol  3.125 mg Oral BID  . cefTRIAXone (ROCEPHIN)  IV  1 g Intravenous Q24H  . digoxin  0.125 mg Oral Daily  . erythromycin   Left Eye 3 times per  day  . furosemide  40 mg Intravenous Q12H  . levothyroxine  112 mcg Oral QAC breakfast  . pantoprazole  40 mg Oral BID  . polyethylene glycol  34 g Oral BID  . senna-docusate  2 tablet Oral BID  . spironolactone  12.5 mg Oral Daily  . tamsulosin  0.4 mg Oral Daily   acetaminophen, diphenhydrAMINE, nitroGLYCERIN  Assessment/ Plan:  80 y.o. white male with hypertension, hypothyroidism, diabetes mellitus type 2, hyperlipidemia, GERD, erectile function disorder, BPH, atrial fibrillation, coronary artery disease, systolic congestive heart failure, severe tricuspid regurgitation, admission 86/77/37 for acute systolic heart failure exacerbation.  1. Acute renal failure/Chronic Kidney Disease stage III with proteinuria: seems to be secondary to  cardiorenal syndrome.  Baseline Cr 1.8 egfr 35 04/23/2015 Presented with acute renal failure with Cr of 2.2 due to acute cardiorenal syndrome. - continue furosemide and monitor volume status.  - consider changing to oral lasix - consider voiding trial  2.  Acute systolic heart failure exacerbation:  Continue lasix/coreg/digoxin/spironolactone at this time, follow K closely.    3.  Hyponatremia: due to hypervolemia from congestive heart failure, Na up to 132, continue lasix at this time. Baseline 136 on 04/23/2015     LOS: 4 Jeffrey Gallegos 10/31/201611:07 AM

## 2015-05-26 NOTE — Progress Notes (Signed)
Pt is a&o, VSS, A-V paced on tele with no complaints of pain or discomfort. Foley removed and pt voided. Order to D/C pt to home with [facility]. [facility] set up by CM. Discharge instructions given to pt and wife with verbal acknowledgment of understanding, IV and tele removed. Prescriptions e-submitted. Pt escorted off unit via wheelchair by volunteer.

## 2015-05-27 ENCOUNTER — Encounter: Payer: Medicare Other | Attending: Pediatric Cardiology

## 2015-05-27 DIAGNOSIS — I5022 Chronic systolic (congestive) heart failure: Secondary | ICD-10-CM | POA: Insufficient documentation

## 2015-05-28 NOTE — Discharge Summary (Signed)
Jeffrey Gallegos, is a 80 y.o. male  DOB [birth year]  MRN [id number].  Admission date:  05/22/2015  Admitting Physician  [name], MD  Discharge Date:  05/26/2015   Primary MD  [name], MD  Recommendations for primary care physician for things to follow:     Admission Diagnosis  Congestive heart failure, unspecified congestive heart failure chronicity, unspecified congestive heart failure type [facility]) [I50.9]   Discharge Diagnosis  Congestive heart failure, unspecified congestive heart failure chronicity, unspecified congestive heart failure type ([place]) [I50.9]   Active Problems:   Acute on chronic systolic CHF (congestive heart failure) ([place])      Past Medical History  Diagnosis Date  . History of MI (myocardial infarction) 2011  . History of mumps     Childhood  . Coronary artery disease   . Hypertension   . Sleep apnea   . Thyroid disease     synthroid  . Anemia   . Vitamin D deficiency   . Chronic systolic CHF (congestive heart failure) ([place])   . HLD (hyperlipidemia)   . GERD (gastroesophageal reflux disease)   . A-fib ([place])     on eliquis  . BPH (benign prostatic hyperplasia)   . CKD (chronic kidney disease), stage III     Past Surgical History  Procedure Laterality Date  . Coronary artery bypass graft  11/27/2009    [facility]V at [facility]  . Lasik Bilateral 2002  . Cyst excision Right     Foot  . Leg surgery      Right leg fracture  . Cardiac catheterization    . Electrophysiologic study N/A 01/13/2015    Procedure: CARDIOVERSION;  Surgeon: Teodoro Spray, MD;  Location: [facility] ORS;  Service: Cardiovascular;  Laterality: N/A;  . Esophagogastroduodenoscopy (egd) with propofol N/A 05/19/2015    Procedure: ESOPHAGOGASTRODUODENOSCOPY (EGD) WITH PROPOFOL;  Surgeon: Josefine Class, MD;  Location: [facility]  ENDOSCOPY;  Service: Endoscopy;  Laterality: N/A;  . Colonoscopy with propofol N/A 05/19/2015    Procedure: COLONOSCOPY WITH PROPOFOL;  Surgeon: Josefine Class, MD;  Location: [facility] ENDOSCOPY;  Service: Endoscopy;  Laterality: N/A;       History of present illness and  Hospital Course:     Kindly see H&P for history of present illness and admission details, please review complete Labs, Consult reports and Test reports for all details in brief  HPI  from the history and physical done on the day of admission 80 year old male patient with the chronic systolic heart failure coronary artery disease status post CABG, ICM second came because of weight gain. Noted to have shortness of breath, worsening leg swelling.   Hospital Course  #1 acute on chronic systolic heart failure: EF 5040% by most recent echo. Continued on beta blockers, IV Lasix drip, and continued on spironolactone. Seen by nephrology, cardiology. **Nephrology recommended changing the IV Lasix drip to by mouth Lasix on 31st of October. Patient symptoms nicely improved with Lasix drip. Baseline creatinine 1.8 and GFR 35. Patient discharged home with home physical therapy, registered nurse, home health aide.  Acute on chronic renal failure CKD satge3; renal function returned to baseline. #3 chronic atrial fibrillation status post ablation: Patient continued on epixiban , amiodarone, digoxin, Coreg.  3.Hypothyroidism;continue synthyroid 4. Current constipation: Secondary to  Anal stricture. High risk for surgery i. Anoplasty option under general anesthesia can be done as an outpatient. Patient advised to continue stool softeners, high fiber diet. 5.Left eye stye;advised to use warm compressions,erythromycin ointment Discharge  Condition:    Follow UP  Follow-up Information    Follow up with Alisa Graff, FNP. Go on 05/30/2015.   Specialty:  Family Medicine   Why:  at 9:00am , to the [place]  information:   [place] [address] [place] [address] [phone]       Follow up with [name], MD In 1 week.   Specialty:  Family Medicine   Why:  Monday, November 7th at 1115am, ccs   Contact information:   [place] [address] [phone]       Follow up with Teodoro Spray., MD In 1 week.   Specialty:  Cardiology   Why:  Tuesday, November 8th at 1130am, ccs   Contact information:   [place] [address] [phone]         Discharge Instructions  and  Discharge Medications    Discharge Instructions    Face-to-face encounter (required for Medicare/Medicaid patients)    Complete by:  As directed   I [facility] certify that this patient is under my care and that I, or a nurse practitioner or physician's assistant working with me, had a face-to-face encounter that meets the physician face-to-face encounter requirements with this patient on 05/26/2015. The encounter with the patient was in whole, or in part for the following medical condition(s) which is the primary reason for home health care (List medical acute on chronic CHF SOB weakness  The encounter with the patient was in whole, or in part, for the following medical condition, which is the primary reason for home health care:  whole  I certify that, based on my findings, the following services are medically necessary home health services:   Nursing Physical therapy    Reason for Medically Necessary Home Health Services:  Skilled Nursing- Teaching of Disease Process/Symptom Management  My clinical findings support the need for the above services:  Shortness of breath with activity  Further, I certify that my clinical findings support that this patient is homebound due to:  Shortness of Breath with activity     Home Health    Complete by:  As directed   Tele health  To provide the following care/treatments:   PT RN              Medication  List    TAKE these medications        acetaminophen 500 MG tablet  Commonly known as:  TYLENOL  Take 500 mg by mouth every 6 (six) hours as needed for mild pain, moderate pain, fever or headache.     amiodarone 200 MG tablet  Commonly known as:  PACERONE  Take 200 mg by mouth daily.     atorvastatin 80 MG tablet  Commonly known as:  LIPITOR  TAKE ONE (1) TABLET BY MOUTH EVERY DAY     carvedilol 3.125 MG tablet  Commonly known as:  COREG  Take 1 tablet by mouth 2 (two) times daily.     digoxin 0.125 MG tablet  Commonly known as:  LANOXIN  Take 1 tablet (0.125 mg total) by mouth daily.     ELIQUIS 5 MG Tabs tablet  Generic drug:  apixaban  Take 5 mg by mouth 2 (two) times daily.     erythromycin ophthalmic ointment  Place into the left eye every 8 (eight) hours.     furosemide 40 MG tablet  Commonly known as:  LASIX  Take  1 tablet (40 mg total) by mouth 2 (two) times daily.     levothyroxine 112 MCG tablet  Commonly known as:  SYNTHROID, LEVOTHROID  Take 1 tablet (112 mcg total) by mouth daily before breakfast.     nitroGLYCERIN 0.4 MG SL tablet  Commonly known as:  NITROSTAT  Place 0.4 mg under the tongue every 5 (five) minutes as needed for chest pain.     pantoprazole 40 MG tablet  Commonly known as:  PROTONIX  Take 1 tablet (40 mg total) by mouth 2 (two) times daily.     polyethylene glycol packet  Commonly known as:  MIRALAX  Take 17 g by mouth daily.     Saw Palmetto (Serenoa repens) 450 MG Caps  Take 3 capsules by mouth 2 (two) times daily.     spironolactone 25 MG tablet  Commonly known as:  ALDACTONE  Take 25 mg by mouth daily.     tadalafil 5 MG tablet  Commonly known as:  CIALIS  Take 1 tablet by mouth daily.     tamsulosin 0.4 MG Caps capsule  Commonly known as:  FLOMAX  Take 1 capsule (0.4 mg total) by mouth daily.     torsemide 10 MG tablet  Commonly known as:  DEMADEX  Take 10-20 mg by mouth See admin instructions. 2 tablets every  morning and 1 tablet every evening          Diet and Activity recommendation: See Discharge Instructions above   Consults obtained - GI Surgery CArdiology   Major procedures and Radiology Reports - PLEASE review detailed and final reports for all details, in brief -      Ct Abdomen Pelvis Wo Contrast  05/22/2015  CLINICAL DATA:  Complains of constipation. EXAM: CT ABDOMEN AND PELVIS WITHOUT CONTRAST TECHNIQUE: Multidetector CT imaging of the abdomen and pelvis was performed following the standard protocol without IV contrast. COMPARISON:  05/15/2015 FINDINGS: Lower chest: Again noted are biventricular pacer leads. Again noted is a small right pleural fluid. There is a new small left pleural effusion. Small amount of atelectasis at the right lung base. Again noted is enlargement of the heart. Hepatobiliary: There is increased perihepatic ascites. No gross abnormality to the liver but difficult to exclude mild cirrhosis. There are high-density stones in the gallbladder with small amount of fluid around the gallbladder. The gallbladder is not distended. Pancreas: Normal appearance of the pancreas without duct dilatation or inflammation. Spleen: Again noted is perisplenic ascites.  No enlargement. Adrenals/Urinary Tract: Stable fullness of the left adrenal gland. Normal appearance the right adrenal gland. Normal appearance of both kidneys without stones or hydronephrosis. Again noted is mild bladder wall thickening but the bladder is also not distended. Stomach/Bowel: Large amount of stool in the rectum. Moderate amount of stool throughout most of the colon. Overall, the stool burden in the colon has slightly decreased since 05/15/2015. Oral contrast throughout the small bowel without evidence for dilatation or obstruction. Vascular/Lymphatic: Extensive calcifications involving the abdominal aorta without aneurysm. Iliac arteries are also heavily calcified. No significant lymphadenopathy.  Reproductive: No gross abnormality to the prostate or seminal vesicles. Other: The amount of pelvic ascites is similar to the previous examination but there is increased ascites in the upper abdomen, particularly around the liver. In addition, there is increased subcutaneous edema throughout the abdomen and pelvis compared to 05/15/2015. Musculoskeletal: Extensive facet arthropathy at the lumbosacral junction. No acute bone abnormality. IMPRESSION: There continues to be a large amount of stool in the  colon but the stool burden has slightly decreased since 05/15/2015. Increased abdominal ascites and subcutaneous edema. There is also a new left pleural effusion. Findings are compatible with a fluid overload state. Difficult to exclude mild cirrhosis. Cholelithiasis without gallbladder distension. Again noted is mild bladder wall thickening which is nonspecific. Electronically Signed   By: Markus Daft M.D.   On: 05/22/2015 17:29   Ct Abdomen Pelvis Wo Contrast  05/15/2015  CLINICAL DATA:  Abdominal pain with constipation for the past 1-2 weeks. Decreased urinary output with increased fluid retention. EXAM: CT ABDOMEN AND PELVIS WITHOUT CONTRAST TECHNIQUE: Multidetector CT imaging of the abdomen and pelvis was performed following the standard protocol without IV contrast. COMPARISON:  None. FINDINGS: The lack of intravenous contrast limits the ability to evaluate solid abdominal organs. There is mild nodularity of the hepatic contour. There are several layering radiopaque gallstones within otherwise normal-appearing gallbladder. There is a trace amount of perihepatic ascites. Normal noncontrast appearance of the bilateral kidneys. No renal stones. No urinary obstruction or perinephric stranding. Normal noncontrast appearance of bilateral adrenal glands, pancreas and spleen. A small amount of fluid is noted within the left upper abdominal quadrant. Ingested enteric contrast extends to the level of the cecum. Large  colonic stool burden without definite evidence of enteric obstruction. No pneumoperitoneum, pneumatosis or portal venous gas. Large amount of eccentric irregular calcified atherosclerotic plaque within a normal caliber abdominal aorta. No definitive bulky retroperitoneal, mesenteric, pelvic or inguinal lymphadenopathy on this noncontrast examination. There is a small amount of fluid seen within the pelvic cul-de-sac. Normal noncontrast appearance of the urinary bladder given degree distention. Limited visualization of the lower thorax demonstrates cardiomegaly. Pacer leads are seen within the right atrium, ventricle and coronary sinus. There is diffuse decreased attenuation of the intra cardiac blood pool suggestive of anemia. Calcifications within the aortic valve leaflets. No pericardial effusion. Trace right-sided pleural effusion. Minimal dependent subpleural ground-glass atelectasis. No acute or aggressive osseous abnormalities. Mild-to-moderate multilevel lumbar spine DDD, worse at L4-L5 with disc space height loss, endplate irregularity and sclerosis. Mild diffuse body wall anasarca. Small bilateral mesenteric fat containing inguinal hernias. IMPRESSION: 1. Mild nodularity hepatic contour suggestive of mild cirrhotic change. 2. Small volume intra-abdominal ascites. 3. Cholelithiasis without definite evidence of cholecystitis on this noncontrast examination. 4. No evidence of nephrolithiasis or urinary obstruction. 5. Large colonic stool burden without evidence of enteric obstruction. 6. Cardiomegaly. 7. Suspected anemia. Electronically Signed   By: Sandi Mariscal M.D.   On: 05/15/2015 17:16   Dg Chest 2 View  05/22/2015  CLINICAL DATA:  Fatigue, abdominal pain. EXAM: CHEST  2 VIEW COMPARISON:  05/15/2015 FINDINGS: Cardiac pacemaker, with leads overlying the right atrium, right ventricle and coronary sinus, and postsurgical changes from CABG are stable. Cardiomediastinal silhouette is stably enlarged.  Mediastinal contours appear intact. Atherosclerotic calcifications of the aorta noted. There is no evidence of focal airspace consolidation, or pneumothorax. There are small bilateral pleural effusions. Osseous structures are without acute abnormality. Soft tissues are grossly normal. IMPRESSION: Small bilateral pleural effusions. Stable enlargement of the cardiac silhouette. Electronically Signed   By: Fidela Salisbury M.D.   On: 05/22/2015 15:29   Dg Abd Acute W/chest  05/15/2015  CLINICAL DATA:  1-2 week history of constipation decreased urinary output with increased fluid retention and waking. EXAM: DG ABDOMEN ACUTE W/ 1V CHEST COMPARISON:  Chest x-ray 10/21/2009 FINDINGS: The upright chest x-ray demonstrates mild stable cardiac enlargement. There are surgical changes from triple bypass surgery and there are  right atrial, right ventricular and coronary sinus pacer wires in good position without complicating features. The lungs are clear. Possible small right effusion. No pulmonary edema. Moderate stool throughout the colon and down into the rectum suggesting constipation. No findings for small bowel obstruction or free air. The soft tissue shadows are maintained the bony structures are intact. Mild scoliosis. IMPRESSION: Mild stable cardiac enlargement but no acute pulmonary findings. Moderate to large amount of stool throughout the colon consistent with constipation. Electronically Signed   By: Marijo Sanes M.D.   On: 05/15/2015 15:29    Micro Results     Recent Results (from the past 240 hour(s))  KOH prep     Status: None   Collection Time: 05/19/15  2:14 PM  Result Value Ref Range Status   Specimen Description BRONCH BRUSH TIP  Final   Special Requests NONE  Final   KOH Prep NO YEAST OR FUNGAL ELEMENTS SEEN  Final   Report Status 05/19/2015 FINAL  Final  Urine culture     Status: None   Collection Time: 05/23/15  9:59 AM  Result Value Ref Range Status   Specimen Description URINE,  CLEAN CATCH  Final   Special Requests NONE  Final   Culture INSIGNIFICANT GROWTH  Final   Report Status 05/25/2015 FINAL  Final       Today   Subjective:   [place] today has no headache,no chest abdominal pain,no new weakness tingling or numbness, feels much better wants to go home today.   Objective:   Blood pressure 104/58, pulse 60, temperature 98.3 F (36.8 C), temperature source Oral, resp. rate 17, height 5' 8.5" (1.74 m), weight 69.99 kg (154 lb 4.8 oz), SpO2 99 %.  No intake or output data in the 24 hours ending 05/28/15 1426  Exam Awake Alert, Oriented x 3, No new F.N deficits, Normal affect [place].AT,PERRAL Supple Neck,No JVD, No cervical lymphadenopathy appriciated.  Symmetrical Chest wall movement, Good air movement bilaterally, CTAB RRR,No Gallops,Rubs or new Murmurs, No Parasternal Heave +ve B.Sounds, Abd Soft, Non tender, No organomegaly appriciated, No rebound -guarding or rigidity. No Cyanosis, Clubbing or edema, No new Rash or bruise  Data Review   CBC w Diff:  Lab Results  Component Value Date   WBC 5.5 05/25/2015   HGB 8.6* 05/25/2015   HCT 27.0* 05/25/2015   PLT 104* 05/25/2015   LYMPHOPCT 15 05/22/2015   MONOPCT 9 05/22/2015   EOSPCT 1 05/22/2015   BASOPCT 1 05/22/2015    CMP:  Lab Results  Component Value Date   NA 132* 05/25/2015   NA 130* 04/03/2015   K 4.0 05/25/2015   CL 99* 05/25/2015   CO2 25 05/25/2015   BUN 31* 05/25/2015   BUN 30* 04/03/2015   CREATININE 1.73* 05/25/2015   CREATININE 1.3 08/26/2014   GLU 103 08/26/2014   PROT 6.3* 05/22/2015   ALBUMIN 2.8* 05/22/2015   ALBUMIN 3.6 04/03/2015   BILITOT 1.4* 05/22/2015   ALKPHOS 178* 05/22/2015   AST 34 05/22/2015   ALT 19 05/22/2015  .   Total Time in preparing paper work, data evaluation and todays exam - 31 minutes  Bora Bost M.D on 05/28/2015 at 2:26 PM    Note: This dictation was prepared with Dragon dictation along with smaller phrase technology.  Any transcriptional errors that result from this process are unintentional.

## 2015-05-30 ENCOUNTER — Encounter: Payer: Self-pay | Admitting: Family

## 2015-05-30 ENCOUNTER — Ambulatory Visit: Payer: Medicare Other | Attending: Family | Admitting: Family

## 2015-05-30 VITALS — BP 97/52 | HR 60 | Resp 20 | Ht 68.0 in | Wt 143.0 lb

## 2015-05-30 DIAGNOSIS — Z87891 Personal history of nicotine dependence: Secondary | ICD-10-CM | POA: Diagnosis not present

## 2015-05-30 DIAGNOSIS — E079 Disorder of thyroid, unspecified: Secondary | ICD-10-CM | POA: Diagnosis not present

## 2015-05-30 DIAGNOSIS — N400 Enlarged prostate without lower urinary tract symptoms: Secondary | ICD-10-CM | POA: Diagnosis not present

## 2015-05-30 DIAGNOSIS — E785 Hyperlipidemia, unspecified: Secondary | ICD-10-CM | POA: Insufficient documentation

## 2015-05-30 DIAGNOSIS — Z79899 Other long term (current) drug therapy: Secondary | ICD-10-CM | POA: Insufficient documentation

## 2015-05-30 DIAGNOSIS — G4730 Sleep apnea, unspecified: Secondary | ICD-10-CM | POA: Diagnosis not present

## 2015-05-30 DIAGNOSIS — E559 Vitamin D deficiency, unspecified: Secondary | ICD-10-CM | POA: Diagnosis not present

## 2015-05-30 DIAGNOSIS — I252 Old myocardial infarction: Secondary | ICD-10-CM | POA: Diagnosis not present

## 2015-05-30 DIAGNOSIS — I4891 Unspecified atrial fibrillation: Secondary | ICD-10-CM | POA: Diagnosis not present

## 2015-05-30 DIAGNOSIS — D649 Anemia, unspecified: Secondary | ICD-10-CM | POA: Diagnosis not present

## 2015-05-30 DIAGNOSIS — I5022 Chronic systolic (congestive) heart failure: Secondary | ICD-10-CM | POA: Diagnosis present

## 2015-05-30 DIAGNOSIS — R0602 Shortness of breath: Secondary | ICD-10-CM | POA: Diagnosis present

## 2015-05-30 DIAGNOSIS — K219 Gastro-esophageal reflux disease without esophagitis: Secondary | ICD-10-CM | POA: Insufficient documentation

## 2015-05-30 DIAGNOSIS — N183 Chronic kidney disease, stage 3 (moderate): Secondary | ICD-10-CM | POA: Diagnosis not present

## 2015-05-30 DIAGNOSIS — I2510 Atherosclerotic heart disease of native coronary artery without angina pectoris: Secondary | ICD-10-CM | POA: Diagnosis not present

## 2015-05-30 DIAGNOSIS — Z7901 Long term (current) use of anticoagulants: Secondary | ICD-10-CM | POA: Diagnosis not present

## 2015-05-30 DIAGNOSIS — I482 Chronic atrial fibrillation, unspecified: Secondary | ICD-10-CM

## 2015-05-30 DIAGNOSIS — I10 Essential (primary) hypertension: Secondary | ICD-10-CM

## 2015-05-30 DIAGNOSIS — I129 Hypertensive chronic kidney disease with stage 1 through stage 4 chronic kidney disease, or unspecified chronic kidney disease: Secondary | ICD-10-CM | POA: Diagnosis not present

## 2015-05-30 NOTE — Progress Notes (Signed)
Subjective:    Patient ID: Jeffrey Gallegos, male    DOB: 15-Feb-1935, 80 y.o.   MRN: [id number]  Congestive Heart Failure Presents for initial visit. The disease course has been improving. Associated symptoms include edema, fatigue and shortness of breath (getting better). Pertinent negatives include no abdominal pain, chest pain, chest pressure, orthopnea or palpitations. The symptoms have been improving. Past treatments include beta blockers, salt and fluid restriction, digoxin and aldosterone receptor blockers. The treatment provided moderate relief. Compliance with prior treatments has been good. His past medical history is significant for anemia, arrhythmia, CAD and HTN. There is no history of CVA. He has multiple 1st degree relatives with heart disease. Compliance with total regimen is 76-100%.  Other This is a recurrent problem. The current episode started more than 1 month ago. The problem occurs daily. The problem has been gradually improving. Associated symptoms include fatigue. Pertinent negatives include no abdominal pain, chest pain, congestion, coughing, headaches, numbness or sore throat. The symptoms are aggravated by walking and standing. He has tried position changes (compression socks) for the symptoms. The treatment provided moderate relief.   Past Medical History  Diagnosis Date  . History of MI (myocardial infarction) 2011  . History of mumps     Childhood  . Coronary artery disease   . Hypertension   . Sleep apnea   . Thyroid disease     synthroid  . Anemia   . Vitamin D deficiency   . Chronic systolic CHF (congestive heart failure) ([place])   . HLD (hyperlipidemia)   . GERD (gastroesophageal reflux disease)   . A-fib ([place])     on eliquis  . BPH (benign prostatic hyperplasia)   . CKD (chronic kidney disease), stage III     Past Surgical History  Procedure Laterality Date  . Coronary artery bypass graft  11/27/2009    4V at [facility]  . Lasik Bilateral 2002  . Cyst  excision Right     Foot  . Leg surgery      Right leg fracture  . Cardiac catheterization    . Electrophysiologic study N/A 01/13/2015    Procedure: CARDIOVERSION;  Surgeon: Teodoro Spray, MD;  Location: [facility] ORS;  Service: Cardiovascular;  Laterality: N/A;  . Esophagogastroduodenoscopy (egd) with propofol N/A 05/19/2015    Procedure: ESOPHAGOGASTRODUODENOSCOPY (EGD) WITH PROPOFOL;  Surgeon: Josefine Class, MD;  Location: [facility] ENDOSCOPY;  Service: Endoscopy;  Laterality: N/A;  . Colonoscopy with propofol N/A 05/19/2015    Procedure: COLONOSCOPY WITH PROPOFOL;  Surgeon: Josefine Class, MD;  Location: [facility] ENDOSCOPY;  Service: Endoscopy;  Laterality: N/A;  . External fixation wrist fracture    . Insert / replace / remove pacemaker      Family History  Problem Relation Age of Onset  . Cancer Mother     Gaspar Cola of death 34  . Heart attack Father     age of death 64  . Heart failure Sister   . Seizures Sister   . Ulcers Sister   . Heart attack Maternal Uncle     age of death 69  . Heart attack Paternal Uncle     age of death 40  . Stroke Sister     age of death 93  . Heart attack Paternal Uncle     age of death 33    Social History  Substance Use Topics  . Smoking status: Former Smoker    Types: Cigarettes    Quit date: 07/26/1968  . Smokeless  tobacco: Current User    Types: Chew     Comment: Quit smoking in the 1970s  . Alcohol Use: 0.0 oz/week    0 Standard drinks or equivalent per week     Comment: Drinks Scotch    No Known Allergies  Prior to Admission medications   Medication Sig Start Date End Date Taking? Authorizing Provider  acetaminophen (TYLENOL) 500 MG tablet Take 500 mg by mouth every 6 (six) hours as needed for mild pain, moderate pain, fever or headache.   Yes Historical Provider, MD  amiodarone (PACERONE) 200 MG tablet Take 200 mg by mouth daily.    Yes Historical Provider, MD  atorvastatin (LIPITOR) 80 MG tablet TAKE ONE (1) TABLET BY  MOUTH EVERY DAY Patient taking differently: TAKE ONE (0.5) TABLET BY MOUTH EVERY OTHER NIGHT 04/21/15  Yes Birdie Sons, MD  carvedilol (COREG) 3.125 MG tablet Take 1 tablet by mouth 2 (two) times daily. 02/05/15  Yes Historical Provider, MD  digoxin (LANOXIN) 0.125 MG tablet Take 1 tablet (0.125 mg total) by mouth daily. 05/26/15  Yes Epifanio Lesches, MD  ELIQUIS 5 MG TABS tablet Take 5 mg by mouth 2 (two) times daily.  02/25/15  Yes Historical Provider, MD  erythromycin ophthalmic ointment Place into the left eye every 8 (eight) hours. 05/26/15  Yes Epifanio Lesches, MD  levothyroxine (SYNTHROID, LEVOTHROID) 112 MCG tablet Take 1 tablet (112 mcg total) by mouth daily before breakfast. 04/04/15  Yes Birdie Sons, MD  nitroGLYCERIN (NITROSTAT) 0.4 MG SL tablet Place 0.4 mg under the tongue every 5 (five) minutes as needed for chest pain.   Yes Historical Provider, MD  pantoprazole (PROTONIX) 40 MG tablet Take 1 tablet (40 mg total) by mouth 2 (two) times daily. 05/20/15 06/20/15 Yes Dustin Flock, MD  polyethylene glycol [facility]) packet Take 17 g by mouth daily. 05/19/15  Yes Dustin Flock, MD  Saw Palmetto, Serenoa repens, 450 MG CAPS Take 3 capsules by mouth 2 (two) times daily.    Yes Historical Provider, MD  spironolactone (ALDACTONE) 25 MG tablet Take 25 mg by mouth daily.  02/24/15  Yes Historical Provider, MD  tamsulosin (FLOMAX) 0.4 MG CAPS capsule Take 1 capsule (0.4 mg total) by mouth daily. 05/26/15  Yes Epifanio Lesches, MD  torsemide (DEMADEX) 10 MG tablet Take 10-20 mg by mouth See admin instructions. 2 tablets every morning and 1 tablet every evening   Yes Historical Provider, MD  tadalafil (CIALIS) 5 MG tablet Take 1 tablet by mouth daily. 08/24/12   Historical Provider, MD      Review of Systems  Constitutional: Positive for fatigue. Negative for appetite change.  HENT: Negative for congestion, postnasal drip and sore throat.   Eyes: Positive for redness (left eye  with healing stye). Negative for pain.  Respiratory: Positive for shortness of breath (getting better). Negative for cough and chest tightness.   Cardiovascular: Positive for leg swelling (wearing compression socks). Negative for chest pain and palpitations.  Gastrointestinal: Negative for abdominal pain and abdominal distention.  Endocrine: Negative.   Genitourinary: Negative.   Musculoskeletal: Negative.   Skin: Negative.   Allergic/Immunologic: Negative.   Neurological: Negative for dizziness, light-headedness, numbness and headaches.  Hematological: Negative for adenopathy. Bruises/bleeds easily.  Psychiatric/Behavioral: Negative for sleep disturbance (sleeping with wedge) and dysphoric mood. The patient is not nervous/anxious.        Objective:   Physical Exam  Constitutional: He is oriented to person, place, and time. He appears well-developed and well-nourished.  HENT:  Head: Normocephalic and atraumatic.  Eyes: Conjunctivae are normal. Pupils are equal, round, and reactive to light.  Neck: Normal range of motion. Neck supple.  Cardiovascular: Normal rate and regular rhythm.   Pulmonary/Chest: Effort normal. He has no wheezes. He has no rales.  Abdominal: Soft. He exhibits no distension. There is no tenderness.  Musculoskeletal: He exhibits edema (pitting edema in both legs. currently wearing compression socks). He exhibits no tenderness.  Neurological: He is alert and oriented to person, place, and time.  Skin: Skin is warm and dry.  Psychiatric: He has a normal mood and affect. His behavior is normal. Thought content normal.  Nursing note and vitals reviewed.   BP 97/52 mmHg  Pulse 60  Resp 20  Ht 5\' 8"  (1.727 m)  Wt 143 lb (64.864 kg)  BMI 21.75 kg/m2  SpO2 100%       Assessment & Plan:  1: Chronic heart failure with reduced ejection fraction- Patient presents with shortness of breath and fatigue upon exertion. He does feel like his symptoms are improving since  hospital discharge. When he does experience symptoms, he will stop what he's doing to rest until symptoms resolve. He is already weighing himself daily and says that his home weight ranges from 138-140 pounds. He will adjust his diuretic some based on his weight. Encouraged him to call for an overnight weight gain of >2 pounds or a weekly weight gain of >5 pounds. He is not adding any salt to his food and says that both he and his wife read food labels. He feels like he's well versed in following a 2000mg  sodium diet. He had already received his flu vaccine back in September 2016. He started cardiac rehab but had to stop as it was too much exertion on him. He is working on his activity at home and may resume rehab after the first of the year.  2: Atrial fibrillation- Patient is currently rate controlled at this time. He is currently taking amiodarone, carvedilol, digoxin and eliquis. Follows closely with his cardiologist. 3: HTN- Blood pressure on the low side today but patient says that his blood pressure has been running low. Denies any dizziness or lightheadedness and he says that he changes positions slowly especially first thing in the mornings.   When discussing a return appointment, patient says that he doesn't want to return. He said that he appreciated the information but he feels like all he does is go to provider's appointments. He has 3 appointments next week already. He says that he has a [facility] administrator, a [facility] cardiologist as well as a heart failure provider.

## 2015-05-30 NOTE — Patient Instructions (Signed)
Continue weighing daily and call for an overnight weight gain of > 2 pounds or a weekly weight gain of >5 pounds. 

## 2015-06-02 ENCOUNTER — Ambulatory Visit (INDEPENDENT_AMBULATORY_CARE_PROVIDER_SITE_OTHER): Payer: Medicare Other | Admitting: Family Medicine

## 2015-06-02 ENCOUNTER — Encounter: Payer: Self-pay | Admitting: Family Medicine

## 2015-06-02 ENCOUNTER — Telehealth: Payer: Self-pay | Admitting: Family Medicine

## 2015-06-02 VITALS — BP 112/74 | HR 67 | Temp 97.8°F | Resp 16 | Wt 146.0 lb

## 2015-06-02 DIAGNOSIS — K5900 Constipation, unspecified: Secondary | ICD-10-CM | POA: Diagnosis not present

## 2015-06-02 DIAGNOSIS — I5022 Chronic systolic (congestive) heart failure: Secondary | ICD-10-CM

## 2015-06-02 DIAGNOSIS — N183 Chronic kidney disease, stage 3 unspecified: Secondary | ICD-10-CM

## 2015-06-02 DIAGNOSIS — K624 Stenosis of anus and rectum: Secondary | ICD-10-CM | POA: Diagnosis not present

## 2015-06-02 DIAGNOSIS — D649 Anemia, unspecified: Secondary | ICD-10-CM

## 2015-06-02 DIAGNOSIS — K5909 Other constipation: Secondary | ICD-10-CM

## 2015-06-02 NOTE — Telephone Encounter (Signed)
Jeffrey Gallegos with [facility] called stating pt is refusing home health services.  Pt does have an appointment today with Dr Caryn Section.  CB#[phone]/MW

## 2015-06-02 NOTE — Progress Notes (Signed)
Patient: Jeffrey Gallegos Male    DOB: Jul 26, 1935   80 y.o.   MRN: [id number] Visit Date: 06/02/2015  Today's Provider: [name], MD   Chief Complaint  Patient presents with  . Hospitalization Follow-up   Subjective:    HPI   Follow up Hospitalizations  Patient was admitted to [facility] on 05/22/2015 and discharged on 05/26/2015. for CHF Treatment for this included, IV furosemide and referral to CHF clinic on 05/30/2015. He was seen at CHF clinic on 11/4 and scheduled to see Dr. Ubaldo Glassing tomorrow.  He reports good compliance with treatment. He reports this condition is Improved.  ---------------------------------------------------------------------   Constipation Had colonoscopy last month with Dr. Rayann Heman and found to have a few polyps and an anal stricture which is thought to be causing constipation. He has severe problems from constipation and sometimes spends hours trying to have a BM. He is anticipating seeing Dr. Rochel Brome for surgery for release of strictures after getting clearance from Dr. Ubaldo Glassing.   No Known Allergies Previous Medications   ACETAMINOPHEN (TYLENOL) 500 MG TABLET    Take 500 mg by mouth every 6 (six) hours as needed for mild pain, moderate pain, fever or headache.   AMIODARONE (PACERONE) 200 MG TABLET    Take 200 mg by mouth daily.    ATORVASTATIN (LIPITOR) 80 MG TABLET    TAKE ONE (1) TABLET BY MOUTH EVERY DAY   CARVEDILOL (COREG) 3.125 MG TABLET    Take 1 tablet by mouth 2 (two) times daily.   DIGOXIN (LANOXIN) 0.125 MG TABLET    Take 1 tablet (0.125 mg total) by mouth daily.   ELIQUIS 5 MG TABS TABLET    Take 5 mg by mouth 2 (two) times daily.    ERYTHROMYCIN OPHTHALMIC OINTMENT    Place into the left eye every 8 (eight) hours.   LEVOTHYROXINE (SYNTHROID, LEVOTHROID) 112 MCG TABLET    Take 1 tablet (112 mcg total) by mouth daily before breakfast.   NITROGLYCERIN (NITROSTAT) 0.4 MG SL TABLET    Place 0.4 mg under the tongue every 5 (five) minutes as  needed for chest pain.   PANTOPRAZOLE (PROTONIX) 40 MG TABLET    Take 1 tablet (40 mg total) by mouth 2 (two) times daily.   POLYETHYLENE GLYCOL (MIRALAX) PACKET    Take 17 g by mouth daily.   SAW PALMETTO, SERENOA REPENS, 450 MG CAPS    Take 3 capsules by mouth 2 (two) times daily.    SPIRONOLACTONE (ALDACTONE) 25 MG TABLET    Take 25 mg by mouth daily.    TADALAFIL (CIALIS) 5 MG TABLET    Take 1 tablet by mouth daily.   TAMSULOSIN (FLOMAX) 0.4 MG CAPS CAPSULE    Take 1 capsule (0.4 mg total) by mouth daily.   TORSEMIDE (DEMADEX) 10 MG TABLET    Take 10-20 mg by mouth See admin instructions. 2 tablets every morning and 1 tablet every evening    Review of Systems  Cardiovascular: Negative for chest pain and palpitations.  Skin:       Patient has been itching since he left the hospital on torso, no rash or redness  Neurological: Positive for headaches. Negative for dizziness and light-headedness.    Social History  Substance Use Topics  . Smoking status: Former Smoker    Types: Cigarettes    Quit date: 07/26/1968  . Smokeless tobacco: Current User    Types: Chew     Comment: Quit smoking  in the 1970s  . Alcohol Use: 0.0 oz/week    0 Standard drinks or equivalent per week     Comment: Drinks Scotch   Objective:   BP 112/74 mmHg  Pulse 67  Temp(Src) 97.8 F (36.6 C) (Oral)  Resp 16  Wt 146 lb (66.225 kg)  SpO2 99%  Physical Exam   General Appearance:    Alert, cooperative, no distress  Eyes:    PERRL, conjunctiva/corneas clear, EOM's intact       Lungs:     Clear to auscultation bilaterally, respirations unlabored  Heart:    Regular rate and rhythm  Neurologic:   Awake, alert, oriented x 3. No apparent focal neurological           defect.          Assessment & Plan:     1. Chronic systolic heart failure (HCC) Greatly improved after inpatient diuresis. No back on maintenance meds. Has follow up with Dr. Ubaldo Glassing tomorrow  2. Anemia, unspecified anemia type He  anticipates labs being drawn at [facility] with upcoming appointment. Advised to call for lab order if not done at [facility].   3. Chronic kidney disease, stage 3 (moderate) Stable  4. Chronic constipation Secondary to anal stricture  5. Anal stricture Anticipating appointment with Dr. Tamala Julian if cleared for surgery by Dr. Ubaldo Glassing.        [name], MD  [facility]

## 2015-06-03 ENCOUNTER — Telehealth: Payer: Self-pay | Admitting: *Deleted

## 2015-06-03 NOTE — Telephone Encounter (Signed)
Called to check on status to return to program. Has been in hospital x two.  Not ready to return to program.

## 2015-06-04 ENCOUNTER — Encounter: Payer: Self-pay | Admitting: *Deleted

## 2015-06-04 DIAGNOSIS — I5022 Chronic systolic (congestive) heart failure: Secondary | ICD-10-CM

## 2015-06-04 NOTE — Progress Notes (Signed)
Cardiac Individual Treatment Plan  Patient Details  Name: Jeffrey Gallegos MRN: [id number] Date of Birth: 1934-07-31 Referring Provider:  Corey Skains, MD  Initial Encounter Date:    Visit Diagnosis: Chronic systolic congestive heart failure ([place])  Patient's Home Medications on Admission:  Current outpatient prescriptions:  .  acetaminophen (TYLENOL) 500 MG tablet, Take 500 mg by mouth every 6 (six) hours as needed for mild pain, moderate pain, fever or headache., Disp: , Rfl:  .  amiodarone (PACERONE) 200 MG tablet, Take 200 mg by mouth Jeffrey Gallegos. , Disp: , Rfl:  .  atorvastatin (LIPITOR) 80 MG tablet, TAKE ONE (1) TABLET BY MOUTH EVERY DAY (Patient taking differently: TAKE ONE (0.5) TABLET BY MOUTH EVERY OTHER NIGHT), Disp: 30 tablet, Rfl: 0 .  carvedilol (COREG) 3.125 MG tablet, Take 1 tablet by mouth 2 (two) times Jeffrey Gallegos., Disp: , Rfl: 2 .  digoxin (LANOXIN) 0.125 MG tablet, Take 1 tablet (0.125 mg total) by mouth Jeffrey Gallegos., Disp: 30 tablet, Rfl: 0 .  ELIQUIS 5 MG TABS tablet, Take 5 mg by mouth 2 (two) times Jeffrey Gallegos. , Disp: , Rfl: 2 .  erythromycin ophthalmic ointment, Place into the left eye every 8 (eight) hours., Disp: 3.5 g, Rfl: 0 .  levothyroxine (SYNTHROID, LEVOTHROID) 112 MCG tablet, Take 1 tablet (112 mcg total) by mouth Jeffrey Gallegos before breakfast., Disp: 30 tablet, Rfl: 3 .  nitroGLYCERIN (NITROSTAT) 0.4 MG SL tablet, Place 0.4 mg under the tongue every 5 (five) minutes as needed for chest pain., Disp: , Rfl:  .  pantoprazole (PROTONIX) 40 MG tablet, Take 1 tablet (40 mg total) by mouth 2 (two) times Jeffrey Gallegos., Disp: 30 tablet, Rfl: 12 .  polyethylene glycol (MIRALAX) packet, Take 17 g by mouth Jeffrey Gallegos., Disp: 14 each, Rfl: 0 .  Saw [place], Serenoa repens, 450 MG CAPS, Take 3 capsules by mouth 2 (two) times Jeffrey Gallegos. , Disp: , Rfl:  .  spironolactone (ALDACTONE) 25 MG tablet, Take 25 mg by mouth Jeffrey Gallegos. , Disp: , Rfl: 2 .  tadalafil (CIALIS) 5 MG tablet, Take 1 tablet by mouth Jeffrey Gallegos., Disp:  , Rfl:  .  tamsulosin (FLOMAX) 0.4 MG CAPS capsule, Take 1 capsule (0.4 mg total) by mouth Jeffrey Gallegos., Disp: 30 capsule, Rfl: 0 .  torsemide (DEMADEX) 10 MG tablet, Take 10-20 mg by mouth See admin instructions. 2 tablets every morning and 1 tablet every evening, Disp: , Rfl:   Past Medical History: Past Medical History  Diagnosis Date  . History of MI (myocardial infarction) 2011  . History of mumps     Childhood  . Coronary artery disease   . Hypertension   . Sleep apnea   . Thyroid disease     synthroid  . Anemia   . Vitamin D deficiency   . Chronic systolic CHF (congestive heart failure) ([place])   . HLD (hyperlipidemia)   . GERD (gastroesophageal reflux disease)   . A-fib ([place])     on eliquis  . BPH (benign prostatic hyperplasia)   . CKD (chronic kidney disease), stage III     Tobacco Use: History  Smoking status  . Former Smoker  . Types: Cigarettes  . Quit date: 07/26/1968  Smokeless tobacco  . Current User  . Types: Chew    Comment: Quit smoking in the 1970s    Labs: Recent Review Flowsheet Data    Labs for ITP Cardiac and Pulmonary Rehab Latest Ref Rng 08/26/2014   Cholestrol 0 - 200 mg/dL 56   LDLCALC - 19  HDL 35 - 70 mg/dL 24(A)   Trlycerides 40 - 160 mg/dL 65   Hemoglobin A1c 4.0 - 6.0 % 6.7(A)       Exercise Target Goals:    Exercise Program Goal: Individual exercise prescription set with THRR, safety & activity barriers. Participant demonstrates ability to understand and report RPE using BORG scale, to self-measure pulse accurately, and to acknowledge the importance of the exercise prescription.  Exercise Prescription Goal: Starting with aerobic activity 30 plus minutes a day, 3 days per week for initial exercise prescription. Provide home exercise prescription and guidelines that participant acknowledges understanding prior to discharge.  Activity Barriers & Risk Stratification:     Activity Barriers & Risk Stratification - 03/18/15 0816     Activity Barriers & Risk Stratification   Activity Barriers None;Deconditioning   Risk Stratification High      6 Minute Walk:     6 Minute Walk      03/18/15 1024       6 Minute Walk   Phase Initial     Distance 800 feet     Walk Time 5.38 minutes     Resting HR 61 bpm     Resting BP 108/52 mmHg     Max Ex. HR 114 bpm     Max Ex. BP 100/50 mmHg     RPE 15     Symptoms No        Initial Exercise Prescription:     Initial Exercise Prescription - 03/18/15 1000    Date of Initial Exercise Prescription   Date 03/18/15   Treadmill   MPH 1.5   Grade 0   Minutes 10   Bike   Level 0.2   Watts 10   Minutes 10   Recumbant Bike   Level 2   RPM 40   Watts 15   Minutes 10   NuStep   Level 2   Watts 30   Minutes 15   Arm Ergometer   Level 1   Watts 8   Minutes 10   Arm/Foot Ergometer   Level 4   Watts 12   Minutes 10   Cybex   Level 1   RPM 50   Minutes 10   Recumbant Elliptical   Level 1   RPM 40   Watts 10   Minutes 10   Elliptical   Level 1   Speed 3   Minutes 1   REL-XR   Level 2   Watts 35   Minutes 15   Prescription Details   Frequency (times per week) 3   Duration Progress to 30 minutes of continuous aerobic without signs/symptoms of physical distress   Intensity   THRR REST +  30   Ratings of Perceived Exertion 11-15   Progression Continue progressive overload as per policy without signs/symptoms or physical distress.   Resistance Training   Training Prescription Yes   Weight 2   Reps 10-15      Exercise Prescription Changes:     Exercise Prescription Changes      04/03/15 1400           Exercise Review   Progression Yes       Response to Exercise   Blood Pressure (Admit) 110/60 mmHg       Blood Pressure (Exercise) 126/56 mmHg       Blood Pressure (Exit) 108/60 mmHg       Heart Rate (Admit) 62 bpm  Heart Rate (Exercise) 76 bpm       Heart Rate (Exit) 56 bpm       Rating of Perceived Exertion (Exercise) 13        Symptoms No       Duration Progress to 30 minutes of continuous aerobic without signs/symptoms of physical distress       Intensity Rest + 30       Progression Continue progressive overload as per policy without signs/symptoms or physical distress.       Resistance Training   Training Prescription Yes       Weight 3       Reps 10-15       Interval Training   Interval Training No       Treadmill   MPH 1.5       Grade 0       Minutes 15       NuStep   Level 2       Watts 25       Minutes 15          Discharge Exercise Prescription (Final Exercise Prescription Changes):     Exercise Prescription Changes - 04/03/15 1400    Exercise Review   Progression Yes   Response to Exercise   Blood Pressure (Admit) 110/60 mmHg   Blood Pressure (Exercise) 126/56 mmHg   Blood Pressure (Exit) 108/60 mmHg   Heart Rate (Admit) 62 bpm   Heart Rate (Exercise) 76 bpm   Heart Rate (Exit) 56 bpm   Rating of Perceived Exertion (Exercise) 13   Symptoms No   Duration Progress to 30 minutes of continuous aerobic without signs/symptoms of physical distress   Intensity Rest + 30   Progression Continue progressive overload as per policy without signs/symptoms or physical distress.   Resistance Training   Training Prescription Yes   Weight 3   Reps 10-15   Interval Training   Interval Training No   Treadmill   MPH 1.5   Grade 0   Minutes 15   NuStep   Level 2   Watts 25   Minutes 15      Nutrition:  Target Goals: Understanding of nutrition guidelines, Jeffrey Gallegos intake of sodium <1523m, cholesterol <2067m calories 30% from fat and 7% or less from saturated fats, Jeffrey Gallegos to have 5 or more servings of fruits and vegetables.  Biometrics:     Pre Biometrics - 03/18/15 1021    Pre Biometrics   Height 5' 8.5" (1.74 m)   Weight 142 lb 8 oz (64.638 kg)   Waist Circumference 36.5 inches   Hip Circumference 36.25 inches   Waist to Hip Ratio 1.01 %   BMI (Calculated) 21.4       Nutrition  Therapy Plan and Nutrition Goals:     Nutrition Therapy & Goals - 04/08/15 0729    Intervention Plan   Intervention Using nutrition plan and personal goals to gain a healthy nutrition lifestyle. Add exercise as prescribed.      Nutrition Discharge: Rate Your Plate Scores:     Rate Your Plate - 0903/54/6566812  Rate Your Plate Scores   Pre Score 78   Pre Score % 87 %      Nutrition Goals Re-Evaluation:     Nutrition Goals Re-Evaluation      04/17/15 1253 05/19/15 1528         Personal Goal #1 Re-Evaluation   Personal Goal #1 Will see his kidney doctor tomorrow and  go from there.  Wife called to say Joban is in the hospital but hopes to return to [facility].       Goal Progress Seen  Yes         Psychosocial: Target Goals: Acknowledge presence or absence of depression, maximize coping skills, provide positive support system. Participant is able to verbalize types and ability to use techniques and skills needed for reducing stress and depression.  Initial Review & Psychosocial Screening:     Initial Psych Review & Screening - 03/18/15 0826    Family Dynamics   Good Support System? Yes   Barriers   Psychosocial barriers to participate in program There are no identifiable barriers or psychosocial needs.;The patient should benefit from training in stress management and relaxation.   Screening Interventions   Interventions Encouraged to exercise;Program counselor consult      Quality of Life Scores:     Quality of Life - 03/18/15 1628    Quality of Life Scores   Health/Function Pre 22.8 %   Socioeconomic Pre 30 %   Psych/Spiritual Pre 29.14 %   Family Pre 30 %   GLOBAL Pre 26.44 %      PHQ-9:     Recent Review Flowsheet Data    Depression screen [facility] 2/9 05/30/2015 03/18/2015   Decreased Interest 0 0   Down, Depressed, Hopeless 0 0   PHQ - 2 Score 0 0   Altered sleeping - 0   Tired, decreased energy - 1   Change in appetite - 0   Feeling bad or  failure about yourself  - 0   Trouble concentrating - 0   Moving slowly or fidgety/restless - 0   Suicidal thoughts - 0   PHQ-9 Score - 1   Difficult doing work/chores - Somewhat difficult      Psychosocial Evaluation and Intervention:     Psychosocial Evaluation - 04/01/15 0943    Psychosocial Evaluation & Interventions   Interventions Encouraged to exercise with the program and follow exercise prescription   Comments Counselor met with Mr. Kundrat for a psychosocial evaluation.  He is an almost 80 year old (in 2 weeks) who has a history of heart attacks, A Fib and Congestive Heart Failure.  He has a spouse of 5 years and reports he is sleeping and eating well at this time.   He denies a history of depression or anxiety or current symptoms.  He reports his mood is typically positive although he gets a little concerned at times about his health issues and some property sale transaction that are currently going on in his life.  His goal for this program is to walk without "wearing out" and increase his stamina and strength.  He plans to participate in a follow up program after graduating from this program.     Continued Psychosocial Services Needed --  Mr. Douthat wil benefit from the psychoeducational components of this program, especially on stress management and relaxation.        Psychosocial Re-Evaluation:   Vocational Rehabilitation: Provide vocational rehab assistance to qualifying candidates.   Vocational Rehab Evaluation & Intervention:     Vocational Rehab - 03/18/15 0817    Initial Vocational Rehab Evaluation & Intervention   Assessment shows need for Vocational Rehabilitation No      Education: Education Goals: Education classes will be provided on a weekly basis, covering required topics. Participant will state understanding/return demonstration of topics presented.  Learning Barriers/Preferences:     Learning Barriers/Preferences -  03/18/15 0817    Learning  Barriers/Preferences   Learning Barriers None   Learning Preferences None      Education Topics: General Nutrition Guidelines/Fats and Fiber: -Group instruction provided by verbal, written material, models and posters to present the general guidelines for heart healthy nutrition. Gives an explanation and review of dietary fats and fiber.   Controlling Sodium/Reading Food Labels: -Group verbal and written material supporting the discussion of sodium use in heart healthy nutrition. Review and explanation with models, verbal and written materials for utilization of the food label.   Exercise Physiology & Risk Factors: - Group verbal and written instruction with models to review the exercise physiology of the cardiovascular system and associated critical values. Details cardiovascular disease risk factors and the goals associated with each risk factor.   Aerobic Exercise & Resistance Training: - Gives group verbal and written discussion on the health impact of inactivity. On the components of aerobic and resistive training programs and the benefits of this training and how to safely progress through these programs.   Flexibility, Balance, General Exercise Guidelines: - Provides group verbal and written instruction on the benefits of flexibility and balance training programs. Provides general exercise guidelines with specific guidelines to those with heart or lung disease. Demonstration and skill practice provided.   Stress Management: - Provides group verbal and written instruction about the health risks of elevated stress, cause of high stress, and healthy ways to reduce stress.   Depression: - Provides group verbal and written instruction on the correlation between heart/lung disease and depressed mood, treatment options, and the stigmas associated with seeking treatment.   Anatomy & Physiology of the Heart: - Group verbal and written instruction and models provide basic cardiac  anatomy and physiology, with the coronary electrical and arterial systems. Review of: AMI, Angina, Valve disease, Heart Failure, Cardiac Arrhythmia, Pacemakers, and the ICD.   Cardiac Procedures: - Group verbal and written instruction and models to describe the testing methods done to diagnose heart disease. Reviews the outcomes of the test results. Describes the treatment choices: Medical Management, Angioplasty, or Coronary Bypass Surgery.   Cardiac Medications: - Group verbal and written instruction to review commonly prescribed medications for heart disease. Reviews the medication, class of the drug, and side effects. Includes the steps to properly store meds and maintain the prescription regimen.   Go Sex-Intimacy & Heart Disease, Get SMART - Goal Setting: - Group verbal and written instruction through game format to discuss heart disease and the return to sexual intimacy. Provides group verbal and written material to discuss and apply goal setting through the application of the S.M.A.R.T. Method.   Other Matters of the Heart: - Provides group verbal, written materials and models to describe Heart Failure, Angina, Valve Disease, and Diabetes in the realm of heart disease. Includes description of the disease process and treatment options available to the cardiac patient.   Exercise & Equipment Safety: - Individual verbal instruction and demonstration of equipment use and safety with use of the equipment.          Cardiac Rehab from 03/18/2015 in [facility] Cardiac Rehab   Date  03/18/15   Educator  SB   Instruction Review Code  2- meets goals/outcomes      Infection Prevention: - Provides verbal and written material to individual with discussion of infection control including proper hand washing and proper equipment cleaning during exercise session.      Cardiac Rehab from 03/18/2015 in [facility] Cardiac Rehab   Date  03/18/15   Educator  SB   Instruction Review Code  2- meets  goals/outcomes      Falls Prevention: - Provides verbal and written material to individual with discussion of falls prevention and safety.      Cardiac Rehab from 03/18/2015 in [facility] Cardiac Rehab   Date  03/18/15   Educator  Sb   Instruction Review Code  2- meets goals/outcomes      Diabetes: - Individual verbal and written instruction to review signs/symptoms of diabetes, desired ranges of glucose level fasting, after meals and with exercise. Advice that pre and post exercise glucose checks will be done for 3 sessions at entry of program.    Knowledge Questionnaire Score:     Knowledge Questionnaire Score - 03/18/15 1625    Knowledge Questionnaire Score   Pre Score 23/28      Personal Goals and Risk Factors at Admission:     Personal Goals and Risk Factors at Admission - 03/18/15 0824    Personal Goals and Risk Factors on Admission    Weight Management Yes   Intervention Learn and follow the exercise and diet guidelines while in the program. Utilize the nutrition and education classes to help gain knowledge of the diet and exercise expectations in the program   Increase Aerobic Exercise and Physical Activity Yes;Sedentary   Intervention While in program, learn and follow the exercise prescription taught. Start at a low level workload and increase workload after able to maintain previous level for 30 minutes. Increase time before increasing intensity.   Intervention Provide exercise education and an individualized exercise prescription that will provide continued progressive overload as per policy without signs/symptoms of physical distress.   Diabetes No   Hypertension No   Lipids Yes   Goal Cholesterol controlled with medications as prescribed, with individualized exercise RX and with personalized nutrition plan. Value goals: LDL < 11m, HDL > 431m Participant states understanding of desired cholesterol values and following prescriptions.   Intervention Provide nutrition &  aerobic exercise along with prescribed medications to achieve LDL <7050mHDL >48m69m Stress Yes   Goal To meet with psychosocial counselor for stress and relaxation information and guidance. To state understanding of performing relaxation techniques and or identifying personal stressors.   Intervention Provide education on types of stress, identifiying stressors, and ways to cope with stress. Provide demonstration and active practice of relaxation techniques.      Personal Goals and Risk Factors Review:      Goals and Risk Factor Review      04/17/15 1253 05/07/15 1414 05/19/15 1529       Increase Aerobic Exercise and Physical Activity   Goals Progress/Improvement seen  No No No     Comments I called Jeffrey Gallegos he said "the exercise is about to kill me". I exercised on Thursday and needed Tuesday to get ready. Now I feel the [place] is taking my strength. MD increased Torsemide but he still have feet swelling,. Jeffrey Gallegos Calixteorts that he is going to the Nephologist tomorrow (Kidney doctor). He will check with his MD. I told him that they can bring him down in wheelchair but he really doesn't want to do. Has been out of Cardiac Rehab a while.  Wife called to say Jeffrey Gallegos the hospital but hopes to return [facility]   Stress   Goal   --  In hospital this past week.  Personal Goals Discharge (Final Personal Goals and Risk Factors Review):      Goals and Risk Factor Review - 05/19/15 1529    Increase Aerobic Exercise and Physical Activity   Goals Progress/Improvement seen  No   Comments Wife called to say Jeffrey Gallegos is in the hospital but hopes to return to [facility].    Stress   Goal --  In hospital this past week.        Comments: 30 day review. Continue with ITP.

## 2015-06-09 DIAGNOSIS — E1122 Type 2 diabetes mellitus with diabetic chronic kidney disease: Secondary | ICD-10-CM | POA: Diagnosis not present

## 2015-06-09 DIAGNOSIS — I130 Hypertensive heart and chronic kidney disease with heart failure and stage 1 through stage 4 chronic kidney disease, or unspecified chronic kidney disease: Secondary | ICD-10-CM | POA: Diagnosis not present

## 2015-06-09 DIAGNOSIS — N183 Chronic kidney disease, stage 3 (moderate): Secondary | ICD-10-CM | POA: Diagnosis not present

## 2015-06-09 DIAGNOSIS — I5022 Chronic systolic (congestive) heart failure: Secondary | ICD-10-CM | POA: Diagnosis not present

## 2015-06-10 ENCOUNTER — Encounter: Payer: Medicare Other | Admitting: *Deleted

## 2015-06-10 NOTE — Progress Notes (Deleted)
Daily Session Note  Patient Details  Name: Jeffrey Gallegos MRN: [id number] Date of Birth: [birth year] Referring Provider:  Birdie Sons, MD  Encounter Date: 06/10/2015  Check In:     Session Check In - 06/10/15 1016    Check-In   Staff Present Diane Joya Gaskins RN, BSN;Janiyah Beery Dillard Essex MS, ACSM CEP Exercise Physiologist;Other   ER physicians immediately available to respond to emergencies See telemetry face sheet for immediately available ER MD   Medication changes reported     No   Fall or balance concerns reported    No   Warm-up and Cool-down Performed on first and last piece of equipment   VAD Patient? No   Pain Assessment   Currently in Pain? No/denies   Multiple Pain Sites No         Goals Met:  Independence with exercise equipment Exercise tolerated well No report of cardiac concerns or symptoms Strength training completed today  Goals Unmet:  Not Applicable  Goals Comments:   Dr. Emily Filbert is Medical Director for [facility] and [facility].

## 2015-07-01 ENCOUNTER — Telehealth: Payer: Self-pay | Admitting: *Deleted

## 2015-07-01 NOTE — Addendum Note (Signed)
Addended by: Lynford Humphrey on: 07/01/2015 01:45 PM   Modules accepted: Orders

## 2015-07-01 NOTE — Telephone Encounter (Signed)
Called to check on status to return to program. Jeffrey Gallegos wishes to be discharged from the program

## 2015-07-01 NOTE — Progress Notes (Signed)
Discharge Summary  Patient Details  Name: Jeffrey Gallegos MRN: [id number] Date of Birth: [birth year] Referring Provider:  Newman Nickels*   Number of Visits:    Reason for Discharge:  Early Exit:  Personal  Smoking History:  History  Smoking status  . Former Smoker  . Types: Cigarettes  . Quit date: 07/26/1968  Smokeless tobacco  . Current User  . Types: Chew    Comment: Quit smoking in the 1970s    Diagnosis:  Chronic systolic congestive heart failure  ADL [facility]SD:   Initial Exercise Prescription:     Initial Exercise Prescription - 03/18/15 1000    Date of Initial Exercise Prescription   Date 03/18/15   Treadmill   MPH 1.5   Grade 0   Minutes 10   Bike   Level 0.2   Watts 10   Minutes 10   Recumbant Bike   Level 2   RPM 40   Watts 15   Minutes 10   NuStep   Level 2   Watts 30   Minutes 15   Arm Ergometer   Level 1   Watts 8   Minutes 10   Arm/Foot Ergometer   Level 4   Watts 12   Minutes 10   Cybex   Level 1   RPM 50   Minutes 10   Recumbant Elliptical   Level 1   RPM 40   Watts 10   Minutes 10   Elliptical   Level 1   Speed 3   Minutes 1   REL-XR   Level 2   Watts 35   Minutes 15   Prescription Details   Frequency (times per week) 3   Duration Progress to 30 minutes of continuous aerobic without signs/symptoms of physical distress   Intensity   THRR REST +  30   Ratings of Perceived Exertion 11-15   Progression Continue progressive overload as per policy without signs/symptoms or physical distress.   Resistance Training   Training Prescription Yes   Weight 2   Reps 10-15      Discharge Exercise Prescription (Final Exercise Prescription Changes):     Exercise Prescription Changes - 04/03/15 1400    Exercise Review   Progression Yes   Response to Exercise   Blood Pressure (Admit) 110/60 mmHg   Blood Pressure (Exercise) 126/56 mmHg   Blood Pressure (Exit) 108/60 mmHg   Heart Rate (Admit) 62 bpm   Heart Rate  (Exercise) 76 bpm   Heart Rate (Exit) 56 bpm   Rating of Perceived Exertion (Exercise) 13   Symptoms No   Duration Progress to 30 minutes of continuous aerobic without signs/symptoms of physical distress   Intensity Rest + 30   Progression Continue progressive overload as per policy without signs/symptoms or physical distress.   Resistance Training   Training Prescription Yes   Weight 3   Reps 10-15   Interval Training   Interval Training No   Treadmill   MPH 1.5   Grade 0   Minutes 15   NuStep   Level 2   Watts 25   Minutes 15      Functional Capacity:     6 Minute Walk      03/18/15 1024       6 Minute Walk   Phase Initial     Distance 800 feet     Walk Time 5.38 minutes     Resting HR 61 bpm     Resting BP  108/52 mmHg     Max Ex. HR 114 bpm     Max Ex. BP 100/50 mmHg     RPE 15     Symptoms No        Psychological, QOL, Others - Outcomes: PHQ 2/9: Depression screen [facility] 2/9 05/30/2015 03/18/2015  Decreased Interest 0 0  Down, Depressed, Hopeless 0 0  PHQ - 2 Score 0 0  Altered sleeping - 0  Tired, decreased energy - 1  Change in appetite - 0  Feeling bad or failure about yourself  - 0  Trouble concentrating - 0  Moving slowly or fidgety/restless - 0  Suicidal thoughts - 0  PHQ-9 Score - 1  Difficult doing work/chores - Somewhat difficult    Quality of Life:     Quality of Life - 03/18/15 1628    Quality of Life Scores   Health/Function Pre 22.8 %   Socioeconomic Pre 30 %   Psych/Spiritual Pre 29.14 %   Family Pre 30 %   GLOBAL Pre 26.44 %      Personal Goals: Goals established at orientation with interventions provided to work toward goal.     Personal Goals and Risk Factors at Admission - 03/18/15 0824    Personal Goals and Risk Factors on Admission    Weight Management Yes   Intervention Learn and follow the exercise and diet guidelines while in the program. Utilize the nutrition and education classes to help gain knowledge of the  diet and exercise expectations in the program   Increase Aerobic Exercise and Physical Activity Yes;Sedentary   Intervention While in program, learn and follow the exercise prescription taught. Start at a low level workload and increase workload after able to maintain previous level for 30 minutes. Increase time before increasing intensity.   Intervention Provide exercise education and an individualized exercise prescription that will provide continued progressive overload as per policy without signs/symptoms of physical distress.   Diabetes No   Hypertension No   Lipids Yes   Goal Cholesterol controlled with medications as prescribed, with individualized exercise RX and with personalized nutrition plan. Value goals: LDL < 70mg , HDL > 40mg . Participant states understanding of desired cholesterol values and following prescriptions.   Intervention Provide nutrition & aerobic exercise along with prescribed medications to achieve LDL 70mg , HDL >40mg .   Stress Yes   Goal To meet with psychosocial counselor for stress and relaxation information and guidance. To state understanding of performing relaxation techniques and or identifying personal stressors.   Intervention Provide education on types of stress, identifiying stressors, and ways to cope with stress. Provide demonstration and active practice of relaxation techniques.       Personal Goals Discharge:     Goals and Risk Factor Review      04/17/15 1253 05/07/15 1414 05/19/15 1529       Increase Aerobic Exercise and Physical Activity   Goals Progress/Improvement seen  No No No     Comments I called Princess Bruins and he said "the exercise is about to kill me". I exercised on Thursday and needed Tuesday to get ready. Now I feel the [place] is taking my strength. MD increased Torsemide but he still have feet swelling,. Jakeim Palafox reports that he is going to the Nephologist tomorrow (Kidney doctor). He will check with his MD. I told him  that they can bring him down in wheelchair but he really doesn't want to do. Has been out of Cardiac Rehab a while.  Wife called  to say Gianluigi is in the hospital but hopes to return to [place] [place].      Stress   Goal   --  In hospital this past week.         Nutrition & Weight - Outcomes:     Pre Biometrics - 03/18/15 1021    Pre Biometrics   Height 5' 8.5" (1.74 m)   Weight 142 lb 8 oz (64.638 kg)   Waist Circumference 36.5 inches   Hip Circumference 36.25 inches   Waist to Hip Ratio 1.01 %   BMI (Calculated) 21.4       Nutrition:     Nutrition Therapy & Goals - 04/08/15 0729    Intervention Plan   Intervention Using nutrition plan and personal goals to gain a healthy nutrition lifestyle. Add exercise as prescribed.      Nutrition Discharge:     Rate Your Plate - [id number]    Rate Your Plate Scores   Pre Score 78   Pre Score % 87 %      Education Questionnaire Score:     Knowledge Questionnaire Score - 03/18/15 1625    Knowledge Questionnaire Score   Pre Score 23/28      Discharged Last visit in September.  Too many concerns,unable to attend.

## 2015-07-01 NOTE — Progress Notes (Signed)
Cardiac Individual Treatment Plan  Patient Details  Name: SHERIF MILLSPAUGH MRN: [id number] Date of Birth: 1934/12/10 Referring Provider:  Newman Nickels*  Initial Encounter Date:    Visit Diagnosis: Chronic systolic congestive heart failure  Patient's Home Medications on Admission:  Current outpatient prescriptions:  .  acetaminophen (TYLENOL) 500 MG tablet, Take 500 mg by mouth every 6 (six) hours as needed for mild pain, moderate pain, fever or headache., Disp: , Rfl:  .  amiodarone (PACERONE) 200 MG tablet, Take 200 mg by mouth daily. , Disp: , Rfl:  .  atorvastatin (LIPITOR) 80 MG tablet, TAKE ONE (1) TABLET BY MOUTH EVERY DAY (Patient taking differently: TAKE ONE (0.5) TABLET BY MOUTH EVERY OTHER NIGHT), Disp: 30 tablet, Rfl: 0 .  carvedilol (COREG) 3.125 MG tablet, Take 1 tablet by mouth 2 (two) times daily., Disp: , Rfl: 2 .  digoxin (LANOXIN) 0.125 MG tablet, Take 1 tablet (0.125 mg total) by mouth daily., Disp: 30 tablet, Rfl: 0 .  ELIQUIS 5 MG TABS tablet, Take 5 mg by mouth 2 (two) times daily. , Disp: , Rfl: 2 .  erythromycin ophthalmic ointment, Place into the left eye every 8 (eight) hours., Disp: 3.5 g, Rfl: 0 .  levothyroxine (SYNTHROID, LEVOTHROID) 112 MCG tablet, Take 1 tablet (112 mcg total) by mouth daily before breakfast., Disp: 30 tablet, Rfl: 3 .  nitroGLYCERIN (NITROSTAT) 0.4 MG SL tablet, Place 0.4 mg under the tongue every 5 (five) minutes as needed for chest pain., Disp: , Rfl:  .  pantoprazole (PROTONIX) 40 MG tablet, Take 1 tablet (40 mg total) by mouth 2 (two) times daily., Disp: 30 tablet, Rfl: 12 .  polyethylene glycol (MIRALAX) packet, Take 17 g by mouth daily., Disp: 14 each, Rfl: 0 .  Saw Palmetto, Serenoa repens, 450 MG CAPS, Take 3 capsules by mouth 2 (two) times daily. , Disp: , Rfl:  .  spironolactone (ALDACTONE) 25 MG tablet, Take 25 mg by mouth daily. , Disp: , Rfl: 2 .  tadalafil (CIALIS) 5 MG tablet, Take 1 tablet by mouth daily., Disp: ,  Rfl:  .  tamsulosin (FLOMAX) 0.4 MG CAPS capsule, Take 1 capsule (0.4 mg total) by mouth daily., Disp: 30 capsule, Rfl: 0 .  torsemide (DEMADEX) 10 MG tablet, Take 10-20 mg by mouth See admin instructions. 2 tablets every morning and 1 tablet every evening, Disp: , Rfl:   Past Medical History: Past Medical History  Diagnosis Date  . History of MI (myocardial infarction) 2011  . History of mumps     Childhood  . Coronary artery disease   . Hypertension   . Sleep apnea   . Thyroid disease     synthroid  . Anemia   . Vitamin D deficiency   . Chronic systolic CHF (congestive heart failure) ([place])   . HLD (hyperlipidemia)   . GERD (gastroesophageal reflux disease)   . A-fib ([place])     on eliquis  . BPH (benign prostatic hyperplasia)   . CKD (chronic kidney disease), stage III     Tobacco Use: History  Smoking status  . Former Smoker  . Types: Cigarettes  . Quit date: 07/26/1968  Smokeless tobacco  . Current User  . Types: Chew    Comment: Quit smoking in the 1970s    Labs: Recent Review Flowsheet Data    Labs for ITP Cardiac and Pulmonary Rehab Latest Ref Rng 08/26/2014 03/28/2015   Cholestrol 0 - 200 mg/dL 56 -   LDLCALC - 19 -  HDL 35 - 70 mg/dL 24(A) -   Trlycerides 40 - 160 mg/dL 65 -   Hemoglobin A1c - 6.7(A) 6.8       Exercise Target Goals:    Exercise Program Goal: Individual exercise prescription set with THRR, safety & activity barriers. Participant demonstrates ability to understand and report RPE using BORG scale, to self-measure pulse accurately, and to acknowledge the importance of the exercise prescription.  Exercise Prescription Goal: Starting with aerobic activity 30 plus minutes a day, 3 days per week for initial exercise prescription. Provide home exercise prescription and guidelines that participant acknowledges understanding prior to discharge.  Activity Barriers & Risk Stratification:     Activity Barriers & Risk Stratification - 03/18/15 0816     Activity Barriers & Risk Stratification   Activity Barriers None;Deconditioning   Risk Stratification High      6 Minute Walk:     6 Minute Walk      03/18/15 1024       6 Minute Walk   Phase Initial     Distance 800 feet     Walk Time 5.38 minutes     Resting HR 61 bpm     Resting BP 108/52 mmHg     Max Ex. HR 114 bpm     Max Ex. BP 100/50 mmHg     RPE 15     Symptoms No        Initial Exercise Prescription:     Initial Exercise Prescription - 03/18/15 1000    Date of Initial Exercise Prescription   Date 03/18/15   Treadmill   MPH 1.5   Grade 0   Minutes 10   Bike   Level 0.2   Watts 10   Minutes 10   Recumbant Bike   Level 2   RPM 40   Watts 15   Minutes 10   NuStep   Level 2   Watts 30   Minutes 15   Arm Ergometer   Level 1   Watts 8   Minutes 10   Arm/Foot Ergometer   Level 4   Watts 12   Minutes 10   Cybex   Level 1   RPM 50   Minutes 10   Recumbant Elliptical   Level 1   RPM 40   Watts 10   Minutes 10   Elliptical   Level 1   Speed 3   Minutes 1   REL-XR   Level 2   Watts 35   Minutes 15   Prescription Details   Frequency (times per week) 3   Duration Progress to 30 minutes of continuous aerobic without signs/symptoms of physical distress   Intensity   THRR REST +  30   Ratings of Perceived Exertion 11-15   Progression Continue progressive overload as per policy without signs/symptoms or physical distress.   Resistance Training   Training Prescription Yes   Weight 2   Reps 10-15      Exercise Prescription Changes:     Exercise Prescription Changes      04/03/15 1400           Exercise Review   Progression Yes       Response to Exercise   Blood Pressure (Admit) 110/60 mmHg       Blood Pressure (Exercise) 126/56 mmHg       Blood Pressure (Exit) 108/60 mmHg       Heart Rate (Admit) 62 bpm  Heart Rate (Exercise) 76 bpm       Heart Rate (Exit) 56 bpm       Rating of Perceived Exertion (Exercise) 13        Symptoms No       Duration Progress to 30 minutes of continuous aerobic without signs/symptoms of physical distress       Intensity Rest + 30       Progression Continue progressive overload as per policy without signs/symptoms or physical distress.       Resistance Training   Training Prescription Yes       Weight 3       Reps 10-15       Interval Training   Interval Training No       Treadmill   MPH 1.5       Grade 0       Minutes 15       NuStep   Level 2       Watts 25       Minutes 15          Discharge Exercise Prescription (Final Exercise Prescription Changes):     Exercise Prescription Changes - 04/03/15 1400    Exercise Review   Progression Yes   Response to Exercise   Blood Pressure (Admit) 110/60 mmHg   Blood Pressure (Exercise) 126/56 mmHg   Blood Pressure (Exit) 108/60 mmHg   Heart Rate (Admit) 62 bpm   Heart Rate (Exercise) 76 bpm   Heart Rate (Exit) 56 bpm   Rating of Perceived Exertion (Exercise) 13   Symptoms No   Duration Progress to 30 minutes of continuous aerobic without signs/symptoms of physical distress   Intensity Rest + 30   Progression Continue progressive overload as per policy without signs/symptoms or physical distress.   Resistance Training   Training Prescription Yes   Weight 3   Reps 10-15   Interval Training   Interval Training No   Treadmill   MPH 1.5   Grade 0   Minutes 15   NuStep   Level 2   Watts 25   Minutes 15      Nutrition:  Target Goals: Understanding of nutrition guidelines, daily intake of sodium '1500mg'$ , cholesterol '200mg'$ , calories 30% from fat and 7% or less from saturated fats, daily to have 5 or more servings of fruits and vegetables.  Biometrics:     Pre Biometrics - 03/18/15 1021    Pre Biometrics   Height 5' 8.5" (1.74 m)   Weight 142 lb 8 oz (64.638 kg)   Waist Circumference 36.5 inches   Hip Circumference 36.25 inches   Waist to Hip Ratio 1.01 %   BMI (Calculated) 21.4       Nutrition  Therapy Plan and Nutrition Goals:     Nutrition Therapy & Goals - 04/08/15 0729    Intervention Plan   Intervention Using nutrition plan and personal goals to gain a healthy nutrition lifestyle. Add exercise as prescribed.      Nutrition Discharge: Rate Your Plate Scores:     Rate Your Plate - 75/30/05 1102    Rate Your Plate Scores   Pre Score 78   Pre Score % 87 %      Nutrition Goals Re-Evaluation:     Nutrition Goals Re-Evaluation      04/17/15 1253 05/19/15 1528         Personal Goal #1 Re-Evaluation   Personal Goal #1 Will see his kidney doctor tomorrow and  go from there.  Wife called to say Joban is in the hospital but hopes to return to [facility].       Goal Progress Seen  Yes         Psychosocial: Target Goals: Acknowledge presence or absence of depression, maximize coping skills, provide positive support system. Participant is able to verbalize types and ability to use techniques and skills needed for reducing stress and depression.  Initial Review & Psychosocial Screening:     Initial Psych Review & Screening - 03/18/15 0826    Family Dynamics   Good Support System? Yes   Barriers   Psychosocial barriers to participate in program There are no identifiable barriers or psychosocial needs.;The patient should benefit from training in stress management and relaxation.   Screening Interventions   Interventions Encouraged to exercise;Program counselor consult      Quality of Life Scores:     Quality of Life - 03/18/15 1628    Quality of Life Scores   Health/Function Pre 22.8 %   Socioeconomic Pre 30 %   Psych/Spiritual Pre 29.14 %   Family Pre 30 %   GLOBAL Pre 26.44 %      PHQ-9:     Recent Review Flowsheet Data    Depression screen [facility] 2/9 05/30/2015 03/18/2015   Decreased Interest 0 0   Down, Depressed, Hopeless 0 0   PHQ - 2 Score 0 0   Altered sleeping - 0   Tired, decreased energy - 1   Change in appetite - 0   Feeling bad or  failure about yourself  - 0   Trouble concentrating - 0   Moving slowly or fidgety/restless - 0   Suicidal thoughts - 0   PHQ-9 Score - 1   Difficult doing work/chores - Somewhat difficult      Psychosocial Evaluation and Intervention:     Psychosocial Evaluation - 04/01/15 0943    Psychosocial Evaluation & Interventions   Interventions Encouraged to exercise with the program and follow exercise prescription   Comments Counselor met with Mr. Kundrat for a psychosocial evaluation.  He is an almost 80 year old (in 2 weeks) who has a history of heart attacks, A Fib and Congestive Heart Failure.  He has a spouse of 5 years and reports he is sleeping and eating well at this time.   He denies a history of depression or anxiety or current symptoms.  He reports his mood is typically positive although he gets a little concerned at times about his health issues and some property sale transaction that are currently going on in his life.  His goal for this program is to walk without "wearing out" and increase his stamina and strength.  He plans to participate in a follow up program after graduating from this program.     Continued Psychosocial Services Needed --  Mr. Douthat wil benefit from the psychoeducational components of this program, especially on stress management and relaxation.        Psychosocial Re-Evaluation:   Vocational Rehabilitation: Provide vocational rehab assistance to qualifying candidates.   Vocational Rehab Evaluation & Intervention:     Vocational Rehab - 03/18/15 0817    Initial Vocational Rehab Evaluation & Intervention   Assessment shows need for Vocational Rehabilitation No      Education: Education Goals: Education classes will be provided on a weekly basis, covering required topics. Participant will state understanding/return demonstration of topics presented.  Learning Barriers/Preferences:     Learning Barriers/Preferences -  03/18/15 0817    Learning  Barriers/Preferences   Learning Barriers None   Learning Preferences None      Education Topics: General Nutrition Guidelines/Fats and Fiber: -Group instruction provided by verbal, written material, models and posters to present the general guidelines for heart healthy nutrition. Gives an explanation and review of dietary fats and fiber.   Controlling Sodium/Reading Food Labels: -Group verbal and written material supporting the discussion of sodium use in heart healthy nutrition. Review and explanation with models, verbal and written materials for utilization of the food label.   Exercise Physiology & Risk Factors: - Group verbal and written instruction with models to review the exercise physiology of the cardiovascular system and associated critical values. Details cardiovascular disease risk factors and the goals associated with each risk factor.   Aerobic Exercise & Resistance Training: - Gives group verbal and written discussion on the health impact of inactivity. On the components of aerobic and resistive training programs and the benefits of this training and how to safely progress through these programs.   Flexibility, Balance, General Exercise Guidelines: - Provides group verbal and written instruction on the benefits of flexibility and balance training programs. Provides general exercise guidelines with specific guidelines to those with heart or lung disease. Demonstration and skill practice provided.   Stress Management: - Provides group verbal and written instruction about the health risks of elevated stress, cause of high stress, and healthy ways to reduce stress.   Depression: - Provides group verbal and written instruction on the correlation between heart/lung disease and depressed mood, treatment options, and the stigmas associated with seeking treatment.   Anatomy & Physiology of the Heart: - Group verbal and written instruction and models provide basic cardiac  anatomy and physiology, with the coronary electrical and arterial systems. Review of: AMI, Angina, Valve disease, Heart Failure, Cardiac Arrhythmia, Pacemakers, and the ICD.   Cardiac Procedures: - Group verbal and written instruction and models to describe the testing methods done to diagnose heart disease. Reviews the outcomes of the test results. Describes the treatment choices: Medical Management, Angioplasty, or Coronary Bypass Surgery.   Cardiac Medications: - Group verbal and written instruction to review commonly prescribed medications for heart disease. Reviews the medication, class of the drug, and side effects. Includes the steps to properly store meds and maintain the prescription regimen.   Go Sex-Intimacy & Heart Disease, Get SMART - Goal Setting: - Group verbal and written instruction through game format to discuss heart disease and the return to sexual intimacy. Provides group verbal and written material to discuss and apply goal setting through the application of the S.M.A.R.T. Method.   Other Matters of the Heart: - Provides group verbal, written materials and models to describe Heart Failure, Angina, Valve Disease, and Diabetes in the realm of heart disease. Includes description of the disease process and treatment options available to the cardiac patient.   Exercise & Equipment Safety: - Individual verbal instruction and demonstration of equipment use and safety with use of the equipment.          Cardiac Rehab from 03/18/2015 in [facility] Cardiac Rehab   Date  03/18/15   Educator  SB   Instruction Review Code  2- meets goals/outcomes      Infection Prevention: - Provides verbal and written material to individual with discussion of infection control including proper hand washing and proper equipment cleaning during exercise session.      Cardiac Rehab from 03/18/2015 in [facility] Cardiac Rehab   Date  03/18/15   Educator  SB   Instruction Review Code  2- meets  goals/outcomes      Falls Prevention: - Provides verbal and written material to individual with discussion of falls prevention and safety.      Cardiac Rehab from 03/18/2015 in [facility] Cardiac Rehab   Date  03/18/15   Educator  Sb   Instruction Review Code  2- meets goals/outcomes      Diabetes: - Individual verbal and written instruction to review signs/symptoms of diabetes, desired ranges of glucose level fasting, after meals and with exercise. Advice that pre and post exercise glucose checks will be done for 3 sessions at entry of program.    Knowledge Questionnaire Score:     Knowledge Questionnaire Score - 03/18/15 1625    Knowledge Questionnaire Score   Pre Score 23/28      Personal Goals and Risk Factors at Admission:     Personal Goals and Risk Factors at Admission - 03/18/15 0824    Personal Goals and Risk Factors on Admission    Weight Management Yes   Intervention Learn and follow the exercise and diet guidelines while in the program. Utilize the nutrition and education classes to help gain knowledge of the diet and exercise expectations in the program   Increase Aerobic Exercise and Physical Activity Yes;Sedentary   Intervention While in program, learn and follow the exercise prescription taught. Start at a low level workload and increase workload after able to maintain previous level for 30 minutes. Increase time before increasing intensity.   Intervention Provide exercise education and an individualized exercise prescription that will provide continued progressive overload as per policy without signs/symptoms of physical distress.   Diabetes No   Hypertension No   Lipids Yes   Goal Cholesterol controlled with medications as prescribed, with individualized exercise RX and with personalized nutrition plan. Value goals: LDL < 11m, HDL > 431m Participant states understanding of desired cholesterol values and following prescriptions.   Intervention Provide nutrition &  aerobic exercise along with prescribed medications to achieve LDL <7050mHDL >48m69m Stress Yes   Goal To meet with psychosocial counselor for stress and relaxation information and guidance. To state understanding of performing relaxation techniques and or identifying personal stressors.   Intervention Provide education on types of stress, identifiying stressors, and ways to cope with stress. Provide demonstration and active practice of relaxation techniques.      Personal Goals and Risk Factors Review:      Goals and Risk Factor Review      04/17/15 1253 05/07/15 1414 05/19/15 1529       Increase Aerobic Exercise and Physical Activity   Goals Progress/Improvement seen  No No No     Comments I called EddiPrincess Bruins he said "the exercise is about to kill me". I exercised on Thursday and needed Tuesday to get ready. Now I feel the [place] is taking my strength. MD increased Torsemide but he still have feet swelling,. EddiLysander Calixteorts that he is going to the Nephologist tomorrow (Kidney doctor). He will check with his MD. I told him that they can bring him down in wheelchair but he really doesn't want to do. Has been out of Cardiac Rehab a while.  Wife called to say EddiTiltonin the hospital but [facility] to [facility]   Stress   Goal   --  In hospital this past week.  Personal Goals Discharge (Final Personal Goals and Risk Factors Review):      Goals and Risk Factor Review - 05/19/15 1529    Increase Aerobic Exercise and Physical Activity   Goals Progress/Improvement seen  No   Comments Wife called to say Earsel is in the hospital but hopes to return to [facility].    Stress   Goal --  In hospital this past week.       ITP Comments:     ITP Comments      07/01/15 1342           ITP Comments discharged from program per Ahmarion's request.          Comments: discharged per Eddi's request. Unable to attend/last visit in Sept

## 2015-07-02 ENCOUNTER — Encounter: Payer: Self-pay | Admitting: Family Medicine

## 2015-07-02 DIAGNOSIS — K2100 Gastro-esophageal reflux disease with esophagitis, without bleeding: Secondary | ICD-10-CM | POA: Insufficient documentation

## 2015-07-02 DIAGNOSIS — Z86010 Personal history of colonic polyps: Secondary | ICD-10-CM | POA: Insufficient documentation

## 2015-07-02 DIAGNOSIS — K259 Gastric ulcer, unspecified as acute or chronic, without hemorrhage or perforation: Secondary | ICD-10-CM | POA: Insufficient documentation

## 2015-07-14 NOTE — Progress Notes (Signed)
Daily Session Note  Patient Details  Name: Jeffrey Gallegos MRN: [id number] Date of Birth: Jul 06, 1935 Referring Provider:  Birdie Sons, MD  Encounter Date: 06/10/2015  Check In:      Goals Met:  erroneous encounter  Goals Unmet:  Not Applicable  Goals Comments:    Dr. Emily Filbert is Medical Director for [facility].

## 2015-08-01 ENCOUNTER — Encounter: Payer: Self-pay | Admitting: *Deleted

## 2015-08-04 ENCOUNTER — Encounter: Admission: RE | Payer: Self-pay | Source: Ambulatory Visit

## 2015-08-04 SURGERY — ESOPHAGOGASTRODUODENOSCOPY (EGD) WITH PROPOFOL
Anesthesia: General

## 2015-08-08 ENCOUNTER — Other Ambulatory Visit: Payer: Self-pay | Admitting: Family Medicine

## 2015-08-11 ENCOUNTER — Ambulatory Visit: Admission: RE | Admit: 2015-08-11 | Payer: Medicare Other | Source: Ambulatory Visit | Admitting: Gastroenterology

## 2015-08-22 ENCOUNTER — Other Ambulatory Visit: Payer: Self-pay | Admitting: Family Medicine

## 2015-08-25 ENCOUNTER — Ambulatory Visit: Payer: Medicare Other | Admitting: Anesthesiology

## 2015-08-25 ENCOUNTER — Ambulatory Visit
Admission: RE | Admit: 2015-08-25 | Discharge: 2015-08-25 | Disposition: A | Discharge status: Home / Self Care | Payer: Medicare Other | Source: Ambulatory Visit | Attending: Gastroenterology | Admitting: Gastroenterology

## 2015-08-25 ENCOUNTER — Encounter
Admission: RE | Disposition: A | Discharge status: Home / Self Care | Payer: Self-pay | Source: Ambulatory Visit | Attending: Gastroenterology

## 2015-08-25 ENCOUNTER — Encounter: Payer: Self-pay | Admitting: Anesthesiology

## 2015-08-25 DIAGNOSIS — Z951 Presence of aortocoronary bypass graft: Secondary | ICD-10-CM | POA: Insufficient documentation

## 2015-08-25 DIAGNOSIS — Z7901 Long term (current) use of anticoagulants: Secondary | ICD-10-CM | POA: Insufficient documentation

## 2015-08-25 DIAGNOSIS — E079 Disorder of thyroid, unspecified: Secondary | ICD-10-CM | POA: Diagnosis not present

## 2015-08-25 DIAGNOSIS — N400 Enlarged prostate without lower urinary tract symptoms: Secondary | ICD-10-CM | POA: Diagnosis not present

## 2015-08-25 DIAGNOSIS — E559 Vitamin D deficiency, unspecified: Secondary | ICD-10-CM | POA: Diagnosis not present

## 2015-08-25 DIAGNOSIS — Z8249 Family history of ischemic heart disease and other diseases of the circulatory system: Secondary | ICD-10-CM | POA: Insufficient documentation

## 2015-08-25 DIAGNOSIS — Z823 Family history of stroke: Secondary | ICD-10-CM | POA: Diagnosis not present

## 2015-08-25 DIAGNOSIS — G4730 Sleep apnea, unspecified: Secondary | ICD-10-CM | POA: Insufficient documentation

## 2015-08-25 DIAGNOSIS — I130 Hypertensive heart and chronic kidney disease with heart failure and stage 1 through stage 4 chronic kidney disease, or unspecified chronic kidney disease: Secondary | ICD-10-CM | POA: Diagnosis not present

## 2015-08-25 DIAGNOSIS — Z950 Presence of cardiac pacemaker: Secondary | ICD-10-CM | POA: Insufficient documentation

## 2015-08-25 DIAGNOSIS — K257 Chronic gastric ulcer without hemorrhage or perforation: Secondary | ICD-10-CM | POA: Insufficient documentation

## 2015-08-25 DIAGNOSIS — I252 Old myocardial infarction: Secondary | ICD-10-CM | POA: Insufficient documentation

## 2015-08-25 DIAGNOSIS — Z79899 Other long term (current) drug therapy: Secondary | ICD-10-CM | POA: Diagnosis not present

## 2015-08-25 DIAGNOSIS — Z87891 Personal history of nicotine dependence: Secondary | ICD-10-CM | POA: Diagnosis not present

## 2015-08-25 DIAGNOSIS — K219 Gastro-esophageal reflux disease without esophagitis: Secondary | ICD-10-CM | POA: Insufficient documentation

## 2015-08-25 DIAGNOSIS — I4891 Unspecified atrial fibrillation: Secondary | ICD-10-CM | POA: Diagnosis not present

## 2015-08-25 DIAGNOSIS — I5022 Chronic systolic (congestive) heart failure: Secondary | ICD-10-CM | POA: Insufficient documentation

## 2015-08-25 DIAGNOSIS — I071 Rheumatic tricuspid insufficiency: Secondary | ICD-10-CM | POA: Insufficient documentation

## 2015-08-25 DIAGNOSIS — I2510 Atherosclerotic heart disease of native coronary artery without angina pectoris: Secondary | ICD-10-CM | POA: Diagnosis not present

## 2015-08-25 DIAGNOSIS — E785 Hyperlipidemia, unspecified: Secondary | ICD-10-CM | POA: Diagnosis not present

## 2015-08-25 DIAGNOSIS — Z803 Family history of malignant neoplasm of breast: Secondary | ICD-10-CM | POA: Diagnosis not present

## 2015-08-25 DIAGNOSIS — N183 Chronic kidney disease, stage 3 (moderate): Secondary | ICD-10-CM | POA: Diagnosis not present

## 2015-08-25 HISTORY — PX: ESOPHAGOGASTRODUODENOSCOPY: SHX5428

## 2015-08-25 SURGERY — EGD (ESOPHAGOGASTRODUODENOSCOPY)
Anesthesia: General

## 2015-08-25 MED ORDER — SODIUM CHLORIDE 0.9 % IV SOLN
INTRAVENOUS | Status: DC
  Administered 2015-08-25: 08:00:00 via INTRAVENOUS

## 2015-08-25 MED ORDER — LIDOCAINE HCL (CARDIAC) 20 MG/ML IV SOLN
INTRAVENOUS | Status: DC | PRN
  Administered 2015-08-25: 6 mg via INTRAVENOUS

## 2015-08-25 MED ORDER — EPHEDRINE SULFATE 50 MG/ML IJ SOLN
INTRAMUSCULAR | Status: DC | PRN
  Administered 2015-08-25: 10 mg via INTRAVENOUS

## 2015-08-25 MED ORDER — PROPOFOL 10 MG/ML IV BOLUS
INTRAVENOUS | Status: DC | PRN
  Administered 2015-08-25: 20 mg via INTRAVENOUS
  Administered 2015-08-25: 30 mg via INTRAVENOUS
  Administered 2015-08-25: 10 mg via INTRAVENOUS

## 2015-08-25 NOTE — Op Note (Signed)
[facility] Gastroenterology Patient Name: Jeffrey Gallegos Procedure Date: 08/25/2015 8:24 AM MRN: [id number] Account #: [id number] Date of Birth: 09-14-1934 Admit Type: Outpatient Age: 81 Room: [facility] ENDO ROOM 2 Gender: Male Note Status: Finalized Procedure:         Upper GI endoscopy Indications:       Follow-up of chronic gastric ulcer Patient Profile:   This is a 81 year old male. Providers:         Gerrit Heck. Rayann Heman, MD Referring MD:      Kirstie Peri. Caryn Section, MD (Referring MD) Medicines:         Propofol per Anesthesia Complications:     No immediate complications. Procedure:         Pre-Anesthesia Assessment:                    - Prior to the procedure, a History and Physical was                     performed, and patient medications, allergies and                     sensitivities were reviewed. The patient's tolerance of                     previous anesthesia was reviewed.                    - Prior to the procedure, a History and Physical was                     performed, and patient medications, allergies and                     sensitivities were reviewed. The patient's tolerance of                     previous anesthesia was reviewed.                    After obtaining informed consent, the endoscope was passed                     under direct vision. Throughout the procedure, the                     patient's blood pressure, pulse, and oxygen saturations                     were monitored continuously. The Olympus GIF-160 endoscope                     (S#. [id number]) was introduced through the mouth, and                     advanced to the second part of duodenum. The upper GI                     endoscopy was accomplished without difficulty. The patient                     tolerated the procedure well. Findings:      The esophagus was normal.      Diffuse moderate mucosal changes characterized by atrophy were found in       the gastric body.      One  non-bleeding superficial gastric ulcer with no stigmata of bleeding       was found in the gastric antrum. The lesion was 8 mm in largest       dimension. Biopsies were taken with a cold forceps for histology.      The examined duodenum was normal. Impression:        - Normal esophagus.                    - Atrophic mucosa in the gastric body.                    - Superficial gastric ulcer with clean base. Biopsied.                    - Normal examined duodenum. Recommendation:    - Observe patient in GI recovery unit.                    - Resume regular diet.                    - Continue present medications.                    - Decrease protonix to 40 mg daily.                    - Await pathology results.                    - The findings and recommendations were discussed with the                     patient.                    - The findings and recommendations were discussed with the                     patient's family. Procedure Code(s): --- Professional ---                    [phone], Esophagogastroduodenoscopy, flexible, transoral;                     with biopsy, single or multiple Diagnosis Code(s): --- Professional ---                    K31.89, Other diseases of stomach and duodenum                    K25.9, Gastric ulcer, unspecified as acute or chronic,                     without hemorrhage or perforation                    K25.7, Chronic gastric ulcer without hemorrhage or                     perforation CPT copyright 2014 American Medical Association. All rights reserved. The codes documented in this report are preliminary and upon coder review may  be revised to meet current compliance requirements. Mellody Life, MD 08/25/2015 8:40:23 AM This report has been signed electronically. Number of Addenda: 0 Note Initiated On: 08/25/2015 8:24 AM      [facility]

## 2015-08-25 NOTE — Transfer of Care (Signed)
Immediate Anesthesia Transfer of Care Note  Patient: Jeffrey Gallegos  Procedure(s) Performed: Procedure(s): ESOPHAGOGASTRODUODENOSCOPY (EGD) (N/A)  Patient Location: PACU and Endoscopy Unit  Anesthesia Type:General  Level of Consciousness: responds to stimulation  Airway & Oxygen Therapy: Patient connected to nasal cannula oxygen  Post-op Assessment: Report given to RN  Post vital signs: stable  Last Vitals:  Filed Vitals:   08/25/15 0744  BP: 114/61  Pulse: 60  Temp: 35.9 C  Resp: 16    Complications: No apparent anesthesia complications

## 2015-08-25 NOTE — Anesthesia Preprocedure Evaluation (Addendum)
Anesthesia Evaluation  Patient identified by MRN, date of birth, ID band Patient awake    Reviewed: Allergy & Precautions, NPO status , Patient's Chart, lab work & pertinent test results, reviewed documented beta blocker date and time   Airway Mallampati: II  TM Distance: >3 FB     Dental  (+) Chipped   Pulmonary sleep apnea , former smoker,           Cardiovascular hypertension, Pt. on medications and Pt. on home beta blockers + CAD       Neuro/Psych    GI/Hepatic PUD, GERD  ,  Endo/Other  diabetes, Type 2Hypothyroidism   Renal/GU Renal InsufficiencyRenal disease     Musculoskeletal   Abdominal   Peds  Hematology  (+) anemia ,   Anesthesia Other Findings Fragile pt. Will avoid versed. Mild sleep apnea - no CPAP. 2 cardiac stents. Pacemaker. Anemic Hb 8.6. EF 50. Tricuspid Regurg. Low BPs noted in the past.  Reproductive/Obstetrics                            Anesthesia Physical Anesthesia Plan  ASA: II  Anesthesia Plan: General   Post-op Pain Management:    Induction: Intravenous  Airway Management Planned: Nasal Cannula  Additional Equipment:   Intra-op Plan:   Post-operative Plan:   Informed Consent: I have reviewed the patients History and Physical, chart, labs and discussed the procedure including the risks, benefits and alternatives for the proposed anesthesia with the patient or authorized representative who has indicated his/her understanding and acceptance.     Plan Discussed with: CRNA  Anesthesia Plan Comments:         Anesthesia Quick Evaluation

## 2015-08-25 NOTE — H&P (Signed)
Primary Care Physician:  [name], MD  Pre-Procedure History & Physical: HPI:  Jeffrey Gallegos is a 81 y.o. male is here for an endoscopy.   Past Medical History  Diagnosis Date  . History of MI (myocardial infarction) 2011  . History of mumps     Childhood  . Coronary artery disease   . Hypertension   . Sleep apnea   . Thyroid disease     synthroid  . Anemia   . Vitamin D deficiency   . Chronic systolic CHF (congestive heart failure) ([place])   . HLD (hyperlipidemia)   . GERD (gastroesophageal reflux disease)   . A-fib ([place])     on eliquis  . BPH (benign prostatic hyperplasia)   . CKD (chronic kidney disease), stage III     Past Surgical History  Procedure Laterality Date  . Coronary artery bypass graft  11/27/2009    4V at [facility]  . Lasik Bilateral 2002  . Cyst excision Right     Foot  . Leg surgery      Right leg fracture  . Cardiac catheterization    . Electrophysiologic study N/A 01/13/2015    Procedure: CARDIOVERSION;  Surgeon: Teodoro Spray, MD;  Location: [facility] ORS;  Service: Cardiovascular;  Laterality: N/A;  . Esophagogastroduodenoscopy (egd) with propofol N/A 05/19/2015    Procedure: ESOPHAGOGASTRODUODENOSCOPY (EGD) WITH PROPOFOL;  Surgeon: Josefine Class, MD;  Location: [facility] ENDOSCOPY;  Service: Endoscopy;  Laterality: N/A;  . Colonoscopy with propofol N/A 05/19/2015    Procedure: COLONOSCOPY WITH PROPOFOL;  Surgeon: Josefine Class, MD;  Location: [facility] ENDOSCOPY;  Service: Endoscopy;  Laterality: N/A;  . External fixation wrist fracture    . Insert / replace / remove pacemaker      Prior to Admission medications   Medication Sig Start Date End Date Taking? Authorizing Provider  ELIQUIS 5 MG TABS tablet Take 5 mg by mouth 2 (two) times daily.  02/25/15  Yes Historical Provider, MD  acetaminophen (TYLENOL) 500 MG tablet Take 500 mg by mouth every 6 (six) hours as needed for mild pain, moderate pain, fever or headache.    Historical Provider, MD   amiodarone (PACERONE) 200 MG tablet Take 200 mg by mouth daily.     Historical Provider, MD  atorvastatin (LIPITOR) 80 MG tablet TAKE ONE (1) TABLET BY MOUTH EVERY DAY 08/22/15   Birdie Sons, MD  carvedilol (COREG) 3.125 MG tablet Take 1 tablet by mouth 2 (two) times daily. 02/05/15   Historical Provider, MD  digoxin (LANOXIN) 0.125 MG tablet Take 1 tablet (0.125 mg total) by mouth daily. Patient not taking: Reported on 08/25/2015 05/26/15   Epifanio Lesches, MD  erythromycin ophthalmic ointment Place into the left eye every 8 (eight) hours. 05/26/15   Epifanio Lesches, MD  levothyroxine (SYNTHROID, LEVOTHROID) 112 MCG tablet TAKE ONE (1) TABLET BY MOUTH EVERY DAY BEFORE BREAKFAST 08/08/15   Birdie Sons, MD  nitroGLYCERIN (NITROSTAT) 0.4 MG SL tablet Place 0.4 mg under the tongue every 5 (five) minutes as needed for chest pain.    Historical Provider, MD  pantoprazole (PROTONIX) 40 MG tablet Take 1 tablet (40 mg total) by mouth 2 (two) times daily. 05/20/15 06/20/15  Dustin Flock, MD  polyethylene glycol (MIRALAX) packet Take 17 g by mouth daily. 05/19/15   Dustin Flock, MD  Saw [place]met[place], Serenoa repens, 450 MG CAPS Take 3 capsules by mouth 2 (two) times daily.     Historical Provider, MD  spironolactone (ALDACTONE) 25 MG  tablet Take 25 mg by mouth daily.  02/24/15   Historical Provider, MD  tadalafil (CIALIS) 5 MG tablet Take 1 tablet by mouth daily. 08/24/12   Historical Provider, MD  tamsulosin (FLOMAX) 0.4 MG CAPS capsule Take 1 capsule (0.4 mg total) by mouth daily. 05/26/15   Epifanio Lesches, MD  torsemide (DEMADEX) 10 MG tablet Take 10-20 mg by mouth See admin instructions. 2 tablets every morning and 1 tablet every evening    Historical Provider, MD    Allergies as of 08/07/2015  . (No Known Allergies)    Family History  Problem Relation Age of Onset  . Cancer Mother     Gaspar Cola of death 35  . Heart attack Father     age of death 48  . Heart failure Sister   .  Seizures Sister   . Ulcers Sister   . Heart attack Maternal Uncle     age of death 71  . Heart attack Paternal Uncle     age of death 66  . Stroke Sister     age of death 104  . Heart attack Paternal Uncle     age of death 33    Social History   Social History  . Marital Status: Married    Spouse Name: N/A  . Number of Children: 2  . Years of Education: N/A   Occupational History  . Retired     [employer]   Social History Main Topics  . Smoking status: Former Smoker    Types: Cigarettes    Quit date: 07/26/1968  . Smokeless tobacco: Current User    Types: Chew     Comment: Quit smoking in the 1970s  . Alcohol Use: 0.0 oz/week    0 Standard drinks or equivalent per week     Comment: Drinks Building services engineer  . Drug Use: No  . Sexual Activity: Not on file   Other Topics Concern  . Not on file   Social History Narrative     Physical Exam: BP 114/61 mmHg  Pulse 60  Temp(Src) 96.6 F (35.9 C) (Tympanic)  Resp 16  Ht 5\' 8"  (1.727 m)  Wt 63.504 kg (140 lb)  BMI 21.29 kg/m2  SpO2 93% General:   Alert,  pleasant and cooperative in NAD Head:  Normocephalic and atraumatic. Neck:  Supple; no masses or thyromegaly. Lungs:  Clear throughout to auscultation.    Heart:  Regular rate and rhythm. Abdomen:  Soft, nontender and nondistended. Normal bowel sounds, without guarding, and without rebound.   Neurologic:  Alert and  oriented x4;  grossly normal neurologically.  Impression/Plan: Jeffrey Gallegos is here for an endoscopy to be performed to f/u gastric ulcer,  R/o malignancy  Risks, benefits, limitations, and alternatives regarding  endoscopy have been reviewed with the patient.  Questions have been answered.  All parties agreeable.   Josefine Class, MD  08/25/2015, 8:23 AM

## 2015-08-25 NOTE — Discharge Instructions (Signed)

## 2015-08-25 NOTE — Anesthesia Postprocedure Evaluation (Signed)
Anesthesia Post Note  Patient: Jeffrey Gallegos  Procedure(s) Performed: Procedure(s) (LRB): ESOPHAGOGASTRODUODENOSCOPY (EGD) (N/A)  Patient location during evaluation: Endoscopy Anesthesia Type: General Level of consciousness: awake Pain management: pain level controlled Vital Signs Assessment: post-procedure vital signs reviewed and stable Respiratory status: spontaneous breathing Cardiovascular status: blood pressure returned to baseline Anesthetic complications: no    Last Vitals:  Filed Vitals:   08/25/15 0900 08/25/15 0910  BP: 106/68 96/59  Pulse: 62 59  Temp:    Resp: 19 22    Last Pain: There were no vitals filed for this visit.               Prabhnoor Ellenberger S

## 2015-08-26 ENCOUNTER — Encounter: Payer: Self-pay | Admitting: Gastroenterology

## 2015-08-26 LAB — SURGICAL PATHOLOGY

## 2015-09-15 ENCOUNTER — Encounter: Payer: Self-pay | Admitting: *Deleted

## 2015-09-15 NOTE — Pre-Procedure Instructions (Signed)
[facility] IN OR NOTIFIED PATIENT HAS PACEMAKER

## 2015-09-16 ENCOUNTER — Ambulatory Visit: Payer: Medicare Other | Admitting: Anesthesiology

## 2015-09-16 ENCOUNTER — Ambulatory Visit
Admission: RE | Admit: 2015-09-16 | Discharge: 2015-09-16 | Disposition: A | Discharge status: Home / Self Care | Payer: Medicare Other | Source: Ambulatory Visit | Attending: Ophthalmology | Admitting: Ophthalmology

## 2015-09-16 ENCOUNTER — Encounter: Payer: Self-pay | Admitting: *Deleted

## 2015-09-16 ENCOUNTER — Encounter
Admission: RE | Disposition: A | Discharge status: Home / Self Care | Payer: Self-pay | Source: Ambulatory Visit | Attending: Ophthalmology

## 2015-09-16 DIAGNOSIS — Z951 Presence of aortocoronary bypass graft: Secondary | ICD-10-CM | POA: Insufficient documentation

## 2015-09-16 DIAGNOSIS — I130 Hypertensive heart and chronic kidney disease with heart failure and stage 1 through stage 4 chronic kidney disease, or unspecified chronic kidney disease: Secondary | ICD-10-CM | POA: Insufficient documentation

## 2015-09-16 DIAGNOSIS — I4891 Unspecified atrial fibrillation: Secondary | ICD-10-CM | POA: Diagnosis not present

## 2015-09-16 DIAGNOSIS — I209 Angina pectoris, unspecified: Secondary | ICD-10-CM | POA: Insufficient documentation

## 2015-09-16 DIAGNOSIS — I5022 Chronic systolic (congestive) heart failure: Secondary | ICD-10-CM | POA: Diagnosis not present

## 2015-09-16 DIAGNOSIS — E7800 Pure hypercholesterolemia, unspecified: Secondary | ICD-10-CM | POA: Diagnosis not present

## 2015-09-16 DIAGNOSIS — Z955 Presence of coronary angioplasty implant and graft: Secondary | ICD-10-CM | POA: Insufficient documentation

## 2015-09-16 DIAGNOSIS — R0601 Orthopnea: Secondary | ICD-10-CM | POA: Insufficient documentation

## 2015-09-16 DIAGNOSIS — Z87891 Personal history of nicotine dependence: Secondary | ICD-10-CM | POA: Diagnosis not present

## 2015-09-16 DIAGNOSIS — Z8711 Personal history of peptic ulcer disease: Secondary | ICD-10-CM | POA: Diagnosis not present

## 2015-09-16 DIAGNOSIS — R0602 Shortness of breath: Secondary | ICD-10-CM | POA: Diagnosis not present

## 2015-09-16 DIAGNOSIS — M1990 Unspecified osteoarthritis, unspecified site: Secondary | ICD-10-CM | POA: Diagnosis not present

## 2015-09-16 DIAGNOSIS — E079 Disorder of thyroid, unspecified: Secondary | ICD-10-CM | POA: Diagnosis not present

## 2015-09-16 DIAGNOSIS — H2512 Age-related nuclear cataract, left eye: Secondary | ICD-10-CM | POA: Insufficient documentation

## 2015-09-16 DIAGNOSIS — N183 Chronic kidney disease, stage 3 (moderate): Secondary | ICD-10-CM | POA: Insufficient documentation

## 2015-09-16 DIAGNOSIS — M7989 Other specified soft tissue disorders: Secondary | ICD-10-CM | POA: Diagnosis not present

## 2015-09-16 DIAGNOSIS — G4730 Sleep apnea, unspecified: Secondary | ICD-10-CM | POA: Insufficient documentation

## 2015-09-16 DIAGNOSIS — I2510 Atherosclerotic heart disease of native coronary artery without angina pectoris: Secondary | ICD-10-CM | POA: Insufficient documentation

## 2015-09-16 DIAGNOSIS — Z950 Presence of cardiac pacemaker: Secondary | ICD-10-CM | POA: Diagnosis not present

## 2015-09-16 DIAGNOSIS — I252 Old myocardial infarction: Secondary | ICD-10-CM | POA: Insufficient documentation

## 2015-09-16 DIAGNOSIS — K219 Gastro-esophageal reflux disease without esophagitis: Secondary | ICD-10-CM | POA: Insufficient documentation

## 2015-09-16 HISTORY — DX: Cardiac arrhythmia, unspecified: I49.9

## 2015-09-16 HISTORY — DX: Edema, unspecified: R60.9

## 2015-09-16 HISTORY — DX: Angina pectoris, unspecified: I20.9

## 2015-09-16 HISTORY — PX: CATARACT EXTRACTION W/PHACO: SHX586

## 2015-09-16 HISTORY — DX: Unspecified abnormalities of gait and mobility: R26.9

## 2015-09-16 HISTORY — DX: Orthopnea: R06.01

## 2015-09-16 HISTORY — DX: Hypothyroidism, unspecified: E03.9

## 2015-09-16 HISTORY — DX: Presence of cardiac pacemaker: Z95.0

## 2015-09-16 SURGERY — PHACOEMULSIFICATION, CATARACT, WITH IOL INSERTION
Anesthesia: Monitor Anesthesia Care | Site: Eye | Laterality: Left | Wound class: Clean

## 2015-09-16 MED ORDER — SODIUM CHLORIDE 0.9 % IV SOLN
INTRAVENOUS | Status: DC
Start: 2015-09-16 — End: 2015-09-16
  Administered 2015-09-16: 11:00:00 via INTRAVENOUS

## 2015-09-16 MED ORDER — TETRACAINE HCL 0.5 % OP SOLN
OPHTHALMIC | Status: AC
  Administered 2015-09-16: 1 [drp] via OPHTHALMIC
  Filled 2015-09-16: qty 2

## 2015-09-16 MED ORDER — MOXIFLOXACIN HCL 0.5 % OP SOLN
OPHTHALMIC | Status: DC | PRN
  Administered 2015-09-16: 1 [drp] via OPHTHALMIC

## 2015-09-16 MED ORDER — MIDAZOLAM HCL 2 MG/2ML IJ SOLN
INTRAMUSCULAR | Status: DC | PRN
Start: 2015-09-16 — End: 2015-09-16
  Administered 2015-09-16: 1 mg via INTRAVENOUS

## 2015-09-16 MED ORDER — ARMC OPHTHALMIC DILATING GEL
1.0000 "application " | OPHTHALMIC | Status: AC | PRN
  Administered 2015-09-16 (×2): 1 via OPHTHALMIC

## 2015-09-16 MED ORDER — CEFUROXIME OPHTHALMIC INJECTION 1 MG/0.1 ML
INJECTION | OPHTHALMIC | Status: DC | PRN
  Administered 2015-09-16: .1 mL via INTRACAMERAL

## 2015-09-16 MED ORDER — EPINEPHRINE HCL 1 MG/ML IJ SOLN
INTRAMUSCULAR | Status: DC | PRN
  Administered 2015-09-16: 1 mL via OPHTHALMIC

## 2015-09-16 MED ORDER — NA CHONDROIT SULF-NA HYALURON 40-17 MG/ML IO SOLN
INTRAOCULAR | Status: AC
  Filled 2015-09-16: qty 1

## 2015-09-16 MED ORDER — CEFUROXIME OPHTHALMIC INJECTION 1 MG/0.1 ML
INJECTION | OPHTHALMIC | Status: AC
  Filled 2015-09-16: qty 0.1

## 2015-09-16 MED ORDER — POVIDONE-IODINE 5 % OP SOLN
1.0000 "application " | OPHTHALMIC | Status: AC | PRN
  Administered 2015-09-16: 1 via OPHTHALMIC

## 2015-09-16 MED ORDER — NA CHONDROIT SULF-NA HYALURON 40-17 MG/ML IO SOLN
INTRAOCULAR | Status: DC | PRN
  Administered 2015-09-16: 1 mL via INTRAOCULAR

## 2015-09-16 MED ORDER — POVIDONE-IODINE 5 % OP SOLN
OPHTHALMIC | Status: AC
  Administered 2015-09-16: 1 via OPHTHALMIC
  Filled 2015-09-16: qty 30

## 2015-09-16 MED ORDER — MOXIFLOXACIN HCL 0.5 % OP SOLN
OPHTHALMIC | Status: AC
  Filled 2015-09-16: qty 3

## 2015-09-16 MED ORDER — ARMC OPHTHALMIC DILATING GEL
OPHTHALMIC | Status: AC
  Administered 2015-09-16: 1 via OPHTHALMIC
  Filled 2015-09-16: qty 0.25

## 2015-09-16 MED ORDER — EPINEPHRINE HCL 1 MG/ML IJ SOLN
INTRAMUSCULAR | Status: AC
  Filled 2015-09-16: qty 1

## 2015-09-16 MED ORDER — MOXIFLOXACIN HCL 0.5 % OP SOLN: 1.0000 [drp] | OPHTHALMIC | Status: DC | PRN

## 2015-09-16 MED ORDER — TETRACAINE HCL 0.5 % OP SOLN
1.0000 [drp] | OPHTHALMIC | Status: AC | PRN
  Administered 2015-09-16: 1 [drp] via OPHTHALMIC

## 2015-09-16 MED ORDER — CARBACHOL 0.01 % IO SOLN
INTRAOCULAR | Status: DC | PRN
  Administered 2015-09-16: .5 mL via INTRAOCULAR

## 2015-09-16 SURGICAL SUPPLY — 22 items
CANNULA ANT/CHMB 27GA (MISCELLANEOUS) ×3 IMPLANT
CUP MEDICINE 2OZ PLAST GRAD ST (MISCELLANEOUS) ×3 IMPLANT
GLOVE BIO SURGEON STRL SZ8 (GLOVE) ×3 IMPLANT
GLOVE BIOGEL M 6.5 STRL (GLOVE) ×3 IMPLANT
GLOVE SURG LX 8.0 MICRO (GLOVE) ×2
GLOVE SURG LX STRL 8.0 MICRO (GLOVE) ×1 IMPLANT
GOWN STRL REUS W/ TWL LRG LVL3 (GOWN DISPOSABLE) ×2 IMPLANT
GOWN STRL REUS W/TWL LRG LVL3 (GOWN DISPOSABLE) ×4
LENS IOL TECNIS 20.0 (Intraocular Lens) ×3 IMPLANT
LENS IOL TECNIS MONO 1P 20.0 (Intraocular Lens) ×1 IMPLANT
PACK CATARACT (MISCELLANEOUS) ×3 IMPLANT
PACK CATARACT BRASINGTON LX (MISCELLANEOUS) ×3 IMPLANT
PACK EYE AFTER SURG (MISCELLANEOUS) ×3 IMPLANT
SOL BSS BAG (MISCELLANEOUS) ×3
SOL PREP PVP 2OZ (MISCELLANEOUS) ×3
SOLUTION BSS BAG (MISCELLANEOUS) ×1 IMPLANT
SOLUTION PREP PVP 2OZ (MISCELLANEOUS) ×1 IMPLANT
SYR 3ML LL SCALE MARK (SYRINGE) ×3 IMPLANT
SYR 5ML LL (SYRINGE) ×3 IMPLANT
SYR TB 1ML 27GX1/2 LL (SYRINGE) ×3 IMPLANT
WATER STERILE IRR 1000ML POUR (IV SOLUTION) ×3 IMPLANT
WIPE NON LINTING 3.25X3.25 (MISCELLANEOUS) ×3 IMPLANT

## 2015-09-16 NOTE — Anesthesia Postprocedure Evaluation (Signed)
Anesthesia Post Note  Patient: Jeffrey Gallegos  Procedure(s) Performed: Procedure(s) (LRB): CATARACT EXTRACTION PHACO AND INTRAOCULAR LENS PLACEMENT (IOC) (Left)  Patient location during evaluation: Short Stay Anesthesia Type: MAC Level of consciousness: awake, awake and alert and oriented Pain management: pain level controlled Vital Signs Assessment: post-procedure vital signs reviewed and stable Respiratory status: spontaneous breathing and nonlabored ventilation Cardiovascular status: stable Postop Assessment: no signs of nausea or vomiting, no backache and no headache    Last Vitals:  Filed Vitals:   09/16/15 1036  BP: 101/64  Pulse: 66  Temp: 36.5 C  Resp: 16    Last Pain: There were no vitals filed for this visit.               Delaney Meigs

## 2015-09-16 NOTE — Anesthesia Preprocedure Evaluation (Signed)
Anesthesia Evaluation  Patient identified by MRN, date of birth, ID band Patient awake    Reviewed: Allergy & Precautions, H&P , NPO status , Patient's Chart, lab work & pertinent test results, reviewed documented beta blocker date and time   History of Anesthesia Complications Negative for: history of anesthetic complications  Airway Mallampati: II  TM Distance: >3 FB Neck ROM: full    Dental no notable dental hx. (+) Upper Dentures, Chipped, Poor Dentition   Pulmonary shortness of breath and with exertion, sleep apnea , neg COPD, neg recent URI, former smoker,    Pulmonary exam normal breath sounds clear to auscultation       Cardiovascular Exercise Tolerance: Good hypertension, On Medications and On Home Beta Blockers + angina with exertion + CAD, + Past MI (in 2011), + Cardiac Stents, + CABG (4 vessels in 2011) and +CHF  negative cardio ROS Normal cardiovascular exam+ dysrhythmias (s/p ablation) Atrial Fibrillation + pacemaker + Valvular Problems/Murmurs  Rhythm:regular Rate:Normal     Neuro/Psych negative neurological ROS  negative psych ROS   GI/Hepatic negative GI ROS, Neg liver ROS, PUD, GERD  Medicated and Controlled,  Endo/Other  negative endocrine ROSdiabetesHypothyroidism   Renal/GU CRFRenal diseasenegative Renal ROS  negative genitourinary   Musculoskeletal   Abdominal   Peds  Hematology negative hematology ROS (+) Blood dyscrasia, anemia ,   Anesthesia Other Findings Past Medical History:   History of MI (myocardial infarction)           2011         History of mumps                                               Comment:Childhood   Coronary artery disease                                      Hypertension                                                 Sleep apnea                                                  Thyroid disease                                                Comment:synthroid  Anemia                                                       Vitamin D deficiency  Chronic systolic CHF (congestive heart failure*              HLD (hyperlipidemia)                                         GERD (gastroesophageal reflux disease)                       A-fib ([facility])                                                    Comment:on eliquis   BPH (benign prostatic hyperplasia)                           CKD (chronic kidney disease), stage III                      Dysrhythmia                                                  Presence of permanent cardiac pacemaker                      Hypothyroidism                                               Gait abnormality                                               Comment:GETS OFF BALANCE EASILY   Anginal pain ([facility])                                           Orthopnea                                                    Edema                                                          Comment:FEET/LEGS   Reproductive/Obstetrics negative OB ROS                             Anesthesia Physical  Anesthesia Plan  ASA: IV  Anesthesia Plan: MAC   Post-op Pain Management:    Induction:   Airway Management Planned:   Additional Equipment:  Intra-op Plan:   Post-operative Plan:   Informed Consent: I have reviewed the patients History and Physical, chart, labs and discussed the procedure including the risks, benefits and alternatives for the proposed anesthesia with the patient or authorized representative who has indicated his/her understanding and acceptance.   Dental Advisory Given  Plan Discussed with: Anesthesiologist, CRNA and Surgeon  Anesthesia Plan Comments:         Anesthesia Quick Evaluation

## 2015-09-16 NOTE — H&P (Signed)
All labs reviewed. Abnormal studies sent to patients PCP when indicated.  Previous H&P reviewed, patient examined, there are NO CHANGES.  Jeffrey Gallegos LOUIS2/21/201711:52 AM

## 2015-09-16 NOTE — Discharge Instructions (Signed)
AMBULATORY SURGERY  DISCHARGE INSTRUCTIONS   1) The drugs that you were given will stay in your system until tomorrow so for the next 24 hours you should not:  A) Drive an automobile B) Make any legal decisions C) Drink any alcoholic beverage   2) You may resume regular meals tomorrow.  Today it is better to start with liquids and gradually work up to solid foods.  You may eat anything you prefer, but it is better to start with liquids, then soup and crackers, and gradually work up to solid foods.   3) Please notify your doctor immediately if you have any unusual bleeding, trouble breathing, redness and pain at the surgery site, drainage, fever, or pain not relieved by medication.    4) Additional Instructions:   Eye Surgery Discharge Instructions  Expect mild scratchy sensation or mild soreness. DO NOT RUB YOUR EYE!  The day of surgery:  Minimal physical activity, but bed rest is not required  No reading, computer work, or close hand work  No bending, lifting, or straining.  May watch TV  For 24 hours:  No driving, legal decisions, or alcoholic beverages  Safety precautions  Eat anything you prefer: It is better to start with liquids, then soup then solid foods.  _____ Eye patch should be worn until postoperative exam tomorrow.  ____ Solar shield eyeglasses should be worn for comfort in the sunlight/patch while sleeping  Resume all regular medications including aspirin or Coumadin if these were discontinued prior to surgery. You may shower, bathe, shave, or wash your hair. Tylenol may be taken for mild discomfort.  Call your doctor if you experience significant pain, nausea, or vomiting, fever > 101 or other signs of infection. [phone] or [phone] Specific instructions:  Follow-up Information    Follow up with PORFILIO,WILLIAM LOUIS, MD In 1 day.   Specialty:  Ophthalmology   Why:  February 22 at 10:05am   Contact information:   [address] [address] [phone]          Please contact your physician with any problems or Same Day Surgery at [phone], Monday through Friday 6 am to 4 pm, or [place] [facility] number at [phone].

## 2015-09-16 NOTE — Transfer of Care (Signed)
Immediate Anesthesia Transfer of Care Note  Patient: Jeffrey Gallegos  Procedure(s) Performed: Procedure(s) with comments: CATARACT EXTRACTION PHACO AND INTRAOCULAR LENS PLACEMENT (IOC) (Left) - [place] 01:03 AP% 24.2 CDE 15.45 fluid pack lot # [id number]  Patient Location: Short Stay  Anesthesia Type:MAC  Level of Consciousness: awake, alert  and oriented  Airway & Oxygen Therapy: Patient Spontanous Breathing and Patient connected to nasal cannula oxygen  Post-op Assessment: Report given to RN and Post -op Vital signs reviewed and stable  Post vital signs: Reviewed and stable  Last Vitals: 12:23 99% sat 18 resp 59 hr 110/62 97 temp  Filed Vitals:   09/16/15 1036  BP: 101/64  Pulse: 66  Temp: 36.5 C  Resp: 16    Complications: No apparent anesthesia complications

## 2015-09-16 NOTE — Op Note (Signed)
PREOPERATIVE DIAGNOSIS:  Nuclear sclerotic cataract of the left eye.   POSTOPERATIVE DIAGNOSIS:  nuclear sclerotic cataract left eye   OPERATIVE PROCEDURE:  Procedure(s): CATARACT EXTRACTION PHACO AND INTRAOCULAR LENS PLACEMENT (IOC)   SURGEON:  Birder Robson, MD.   ANESTHESIA:   Anesthesiologist: Martha Clan, MD CRNA: Delaney Meigs, CRNA  1.      Managed anesthesia care. 2.      Topical tetracaine drops followed by 2% Xylocaine jelly applied in the preoperative holding area.   COMPLICATIONS:  None.   TECHNIQUE:   Stop and chop   DESCRIPTION OF PROCEDURE:  The patient was examined and consented in the preoperative holding area where the aforementioned topical anesthesia was applied to the left eye and then brought back to the Operating Room where the left eye was prepped and draped in the usual sterile ophthalmic fashion and a lid speculum was placed. A paracentesis was created with the side port blade and the anterior chamber was filled with viscoelastic. A near clear corneal incision was performed with the steel keratome. A continuous curvilinear capsulorrhexis was performed with a cystotome followed by the capsulorrhexis forceps. Hydrodissection and hydrodelineation were carried out with BSS on a blunt cannula. The lens was removed in a stop and chop  technique and the remaining cortical material was removed with the irrigation-aspiration handpiece. The capsular bag was inflated with viscoelastic and the Technis [id number] lens was placed in the capsular bag without complication. The remaining viscoelastic was removed from the eye with the irrigation-aspiration handpiece. The wounds were hydrated. The anterior chamber was flushed with Miostat and the eye was inflated to physiologic pressure. 0.1 mL of cefuroxime concentration 10 mg/mL was placed in the anterior chamber. The wounds were found to be water tight. The eye was dressed with Vigamox. The patient was given protective glasses to wear  throughout the day and a shield with which to sleep tonight. The patient was also given drops with which to begin a drop regimen today and will follow-up with me in one day.  Implant Name Type Inv. Item Serial No. Manufacturer Lot No. LRB No. Used  LENS IOL TECNIS 20.0 - [id number] Intraocular Lens LENS IOL TECNIS 20.0 [id number] AMO [id number] Left 1   Procedure(s) with comments: CATARACT EXTRACTION PHACO AND INTRAOCULAR LENS PLACEMENT (IOC) (Left) - [place] 01:03 AP% 24.2 CDE 15.45 fluid pack lot # [id number]  Electronically signed: [place] 09/16/2015 12:20 PM

## 2015-10-01 ENCOUNTER — Ambulatory Visit (INDEPENDENT_AMBULATORY_CARE_PROVIDER_SITE_OTHER): Payer: Medicare Other | Admitting: Family Medicine

## 2015-10-01 ENCOUNTER — Encounter: Payer: Self-pay | Admitting: Family Medicine

## 2015-10-01 VITALS — BP 100/60 | HR 60 | Temp 98.0°F | Resp 18 | Ht 68.0 in | Wt 146.0 lb

## 2015-10-01 DIAGNOSIS — Z79899 Other long term (current) drug therapy: Secondary | ICD-10-CM

## 2015-10-01 DIAGNOSIS — E039 Hypothyroidism, unspecified: Secondary | ICD-10-CM

## 2015-10-01 DIAGNOSIS — Z0000 Encounter for general adult medical examination without abnormal findings: Secondary | ICD-10-CM

## 2015-10-01 DIAGNOSIS — N183 Chronic kidney disease, stage 3 (moderate): Secondary | ICD-10-CM

## 2015-10-01 DIAGNOSIS — E119 Type 2 diabetes mellitus without complications: Secondary | ICD-10-CM | POA: Diagnosis not present

## 2015-10-01 NOTE — Patient Instructions (Signed)
You need to purchase a 4 legged walker or Rollator

## 2015-10-01 NOTE — Progress Notes (Signed)
Patient: Jeffrey Gallegos, Male    DOB: 1934/07/28, 81 y.o.   MRN: [id number] Visit Date: 10/01/2015  Today's Provider: [name], MD   Chief Complaint  Patient presents with  . Medicare Wellness  . Hypertension    follow up  . Diabetes    follow up  . Hyperlipidemia    follow up  . Chronic Kidney Disease    follow up  . Anemia    follow up   Subjective:    Annual physical  Jeffrey Gallegos is a 81 y.o. male. He feels fairly well. He reports never exercising . He reports he is sleeping poorly. He is frustrated by his declining health, stating he is too weak to do anything due to heart failure. He continues follow up with Dr. Ubaldo Glassing, but has stopped cardiac exercise program and CHF clinic because he didn't feel they were helping. However he states his mood is good and specifically denies feeling depressed.   -----------------------------------------------------------  Diabetes Mellitus Type II, Follow-up:   Lab Results  Component Value Date   HGBA1C 6.8 03/28/2015   HGBA1C 6.7* 08/26/2014   Last seen for diabetes 9 months ago.  Management since then includes no changes. He reports good compliance with treatment. He is not having side effects.  Current symptoms include none and have been stable. Home blood sugar records: not being checked  Episodes of hypoglycemia? no   Current Insulin Regimen: none Most Recent Eye Exam: <1 year Weight trend: increasing steadily Prior visit with dietician: no Current diet: in general, a "healthy" diet   Current exercise: none  ------------------------------------------------------------------------   Hypertension, follow-up:  BP Readings from Last 3 Encounters:  09/16/15 110/62  08/25/15 96/59  06/02/15 112/74    He was last seen for hypertension 1 years ago.  BP at that visit was 132/72. Management since that visit includes no changes .He reports good compliance with treatment. He is not having side effects.    He is not exercising. He is adherent to low salt diet.   Outside blood pressures are 90/54. He is experiencing dyspnea and lower extremity edema.  Patient denies chest pain, irregular heart beat, near-syncope and palpitations.   Cardiovascular risk factors include advanced age (older than 62 for men, 52 for women), diabetes mellitus, dyslipidemia, hypertension and male gender.  Use of agents associated with hypertension: thyroid hormones.   ------------------------------------------------------------------------    Lipid/Cholesterol, Follow-up:   Last seen for this 9 months ago.  Management since that visit includes no changes.  Last Lipid Panel:    Component Value Date/Time   CHOL 56 08/26/2014   TRIG 65 08/26/2014   HDL 24* 08/26/2014   LDLCALC 19 08/26/2014    He reports good compliance with treatment. He is not having side effects.   Wt Readings from Last 3 Encounters:  09/16/15 138 lb (62.596 kg)  08/25/15 140 lb (63.504 kg)  06/02/15 146 lb (66.225 kg)    ------------------------------------------------------------------------  Follow up Anemia:  Last office visit was 4 months ago and no changes were made. Patient reports good compliance with treatment. Patient currently takes OTC iron supplements.  Follow up CKD:  Last office visit was 4 months ago. Patient was stable and no changes were made. Patient reports good compliance with treatment.  Follow up Chronic Systolic Heart Failure:  Last office visit was 4 months ago. No changes were made. Patient was to keep follow up with Dr. Ubaldo Glassing. Patients las visit with Dr. Ubaldo Glassing  was a few weeks ago. Patient was taken off Lipitor and Spironolactone per patient report.   Review of Systems  Constitutional: Positive for activity change. Negative for fever, chills, appetite change and fatigue.  HENT: Positive for trouble swallowing. Negative for congestion, ear pain, hearing loss and nosebleeds.   Eyes: Negative for pain and  visual disturbance.  Respiratory: Negative for cough, chest tightness and shortness of breath.   Cardiovascular: Positive for leg swelling. Negative for chest pain and palpitations.  Gastrointestinal: Negative for nausea, vomiting, abdominal pain, diarrhea, constipation and blood in stool.  Endocrine: Negative for polydipsia, polyphagia and polyuria.  Genitourinary: Negative for dysuria and flank pain.  Musculoskeletal: Negative for myalgias, back pain, joint swelling, arthralgias and neck stiffness.  Skin: Negative for color change, rash and wound.  Neurological: Negative for dizziness, tremors, seizures, speech difficulty, weakness, light-headedness and headaches.  Psychiatric/Behavioral: Positive for sleep disturbance (trouble staying alseep). Negative for behavioral problems, confusion, dysphoric mood and decreased concentration. The patient is not nervous/anxious.   All other systems reviewed and are negative.   Social History   Social History  . Marital Status: Married    Spouse Name: N/A  . Number of Children: 2  . Years of Education: N/A   Occupational History  . Retired     [employer]   Social History Main Topics  . Smoking status: Former Smoker    Types: Cigarettes    Quit date: 07/26/1968  . Smokeless tobacco: Current User    Types: Chew     Comment: Quit smoking in the 1970s  . Alcohol Use: 0.0 oz/week    0 Standard drinks or equivalent per week     Comment: Drinks Building services engineer  . Drug Use: No  . Sexual Activity: Not on file   Other Topics Concern  . Not on file   Social History Narrative    Past Medical History  Diagnosis Date  . History of MI (myocardial infarction) 2011  . History of mumps     Childhood  . Coronary artery disease   . Hypertension   . Sleep apnea   . Thyroid disease     synthroid  . Anemia   . Vitamin D deficiency   . Chronic systolic CHF (congestive heart failure) ([place])   . HLD (hyperlipidemia)   . GERD (gastroesophageal  reflux disease)   . A-fib ([place])     on eliquis  . BPH (benign prostatic hyperplasia)   . CKD (chronic kidney disease), stage III   . Dysrhythmia   . Presence of permanent cardiac pacemaker   . Hypothyroidism   . Gait abnormality     GETS OFF BALANCE EASILY  . Anginal pain ([place])   . Orthopnea   . Edema     FEET/LEGS     Patient Active Problem List   Diagnosis Date Noted  . Gastric ulcer 07/02/2015  . Reflux esophagitis 07/02/2015  . History of adenomatous polyp of colon 07/02/2015  . Chronic constipation 06/02/2015  . Anal stricture 06/02/2015  . Constipation 05/15/2015  . Anemia 05/15/2015  . Hyponatremia 05/15/2015  . Chronic kidney disease 02/21/2015  . Bradycardia 01/16/2015  . Basal cell carcinoma of skin 10/26/2014  . BPH (benign prostatic hyperplasia) 10/26/2014  . Arteriosclerosis of coronary artery 10/26/2014  . ED (erectile dysfunction) of organic origin 10/26/2014  . Hyperlipidemia 10/26/2014  . BP (high blood pressure) 10/26/2014  . Adult hypothyroidism 10/26/2014  . Diabetes ([place]) 10/26/2014  . Polypharmacy 10/26/2014  . Chronic systolic heart  failure ([place]) 09/18/2014  . Cardiomyopathy, ischemic 09/18/2014  . Disorder of right ventricle of heart 09/18/2014  . Atrial fibrillation, chronic ([place]) 09/18/2014  . Fecal occult blood test positive 08/28/2014  . 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 Benign essential HTN 10/20/2013  . CAD in native artery 10/20/2013  . Severe tricuspid regurgitation 01/24/2012    Past Surgical History  Procedure Laterality Date  . Coronary artery bypass graft  11/27/2009    4V at [facility]  . Lasik Bilateral 2002  . Cyst excision Right     Foot  . Leg surgery      Right leg fracture  . Cardiac catheterization    . Electrophysiologic study N/A 01/13/2015    Procedure: CARDIOVERSION;  Surgeon: Teodoro Spray, MD;  Location: [facility] ORS;  Service: Cardiovascular;  Laterality: N/A;  .  Esophagogastroduodenoscopy (egd) with propofol N/A 05/19/2015    Procedure: ESOPHAGOGASTRODUODENOSCOPY (EGD) WITH PROPOFOL;  Surgeon: Josefine Class, MD;  Location: [facility] ENDOSCOPY;  Service: Endoscopy;  Laterality: N/A;  . Colonoscopy with propofol N/A 05/19/2015    Procedure: COLONOSCOPY WITH PROPOFOL;  Surgeon: Josefine Class, MD;  Location: [facility] ENDOSCOPY;  Service: Endoscopy;  Laterality: N/A;  . External fixation wrist fracture    . Insert / replace / remove pacemaker    . Esophagogastroduodenoscopy N/A 08/25/2015    Procedure: ESOPHAGOGASTRODUODENOSCOPY (EGD);  Surgeon: Josefine Class, MD;  Location: [facility] ENDOSCOPY;  Service: Endoscopy;  Laterality: N/A;  . Coronary angioplasty    . Ablation      CARDIAC 2016  . Cataract extraction w/phaco Left 09/16/2015    Procedure: CATARACT EXTRACTION PHACO AND INTRAOCULAR LENS PLACEMENT (IOC);  Surgeon: Birder Robson, MD;  Location: [facility] ORS;  Service: Ophthalmology;  Laterality: Left;  [place] 01:03 AP% 24.2 CDE 15.45 fluid pack lot # [id number]    His family history includes Cancer in his mother; Heart attack in his father, maternal uncle, paternal uncle, and paternal uncle; Heart failure in his sister; Seizures in his sister; Stroke in his sister; Ulcers in his sister.    Previous Medications   ACETAMINOPHEN (TYLENOL) 500 MG TABLET    Take 500 mg by mouth every 6 (six) hours as needed for mild pain, moderate pain, fever or headache.   AMIODARONE (PACERONE) 200 MG TABLET    Take 200 mg by mouth daily.    CARVEDILOL (COREG) 3.125 MG TABLET    Take 1 tablet by mouth 2 (two) times daily.   ELIQUIS 5 MG TABS TABLET    Take 5 mg by mouth 2 (two) times daily.    LEVOTHYROXINE (SYNTHROID, LEVOTHROID) 112 MCG TABLET    TAKE ONE (1) TABLET BY MOUTH EVERY DAY BEFORE BREAKFAST   LINACLOTIDE (LINZESS) 145 MCG CAPS CAPSULE    Take 145 mcg by mouth daily.   NITROGLYCERIN (NITROSTAT) 0.4 MG SL TABLET    Place 0.4 mg under the tongue every 5 (five)  minutes as needed for chest pain.   PANTOPRAZOLE (PROTONIX) 40 MG TABLET    Take 1 tablet (40 mg total) by mouth 2 (two) times daily.   SAW PALMETTO, SERENOA REPENS, 450 MG CAPS    Take 3 capsules by mouth 2 (two) times daily.    TORSEMIDE (DEMADEX) 10 MG TABLET    Take 10-20 mg by mouth See admin instructions. 2 tablets every morning and 1 tablet every evening    Patient Care Team: Birdie Sons, MD as PCP - General (Family Medicine) Aura Fey  Jackelyn Hoehn, FNP as Nurse Practitioner (Family Medicine) Teodoro Spray, MD as Consulting Physician (Cardiology) Lavonia Dana, MD as Consulting Physician (Nephrology)     Objective:   Vitals: BP 100/60 mmHg  Pulse 60  Temp(Src) 98 F (36.7 C) (Oral)  Resp 18  Ht 5\' 8"  (1.727 m)  Wt 146 lb (66.225 kg)  BMI 22.20 kg/m2  SpO2 100%  Physical Exam   General Appearance:    Alert, cooperative, no distress, appears older than stated age. Thin and pale.   Head:    Normocephalic, without obvious abnormality, atraumatic  Eyes:    PERRL, conjunctiva/corneas clear, EOM's intact, fundi    benign, both eyes       Ears:    Normal TM's and external ear canals, both ears  Nose:   Nares normal, septum midline, mucosa normal, no drainage   or sinus tenderness  Throat:   Lips, mucosa, and tongue normal; teeth and gums normal  Neck:   Supple, symmetrical, trachea midline, no adenopathy;       thyroid:  No enlargement/tenderness/nodules; no carotid   bruit or JVD  Back:     Symmetric, no curvature, ROM normal, no CVA tenderness  Lungs:     Clear to auscultation bilaterally, respirations unlabored  Chest wall:    No tenderness or deformity  Heart:    Regular rate and rhythm, S1 and S2 normal, III/VI systolic murmur, no  rub   or gallop  Abdomen:     Soft, non-tender, bowel sounds active all four quadrants,    no masses, no organomegaly  Genitalia:    deferred  Rectal:    deferred  Extremities:   Extremities normal, atraumatic, no cyanosis or edema    Pulses:   2+ and symmetric all extremities  Skin:   Skin color, texture, turgor normal, no rashes or lesions  Lymph nodes:   Cervical, supraclavicular, and axillary nodes normal  Neurologic:   CNII-XII intact. Normal strength, sensation and reflexes      throughout    Activities of Daily Living In your present state of health, do you have any difficulty performing the following activities: 10/01/2015 05/30/2015  Hearing? N N  Vision? N N  Difficulty concentrating or making decisions? N N  Walking or climbing stairs? Y Y  Dressing or bathing? Y N  Doing errands, shopping? Aggie Moats    Fall Risk Assessment Fall Risk  10/01/2015 05/30/2015 03/18/2015  Falls in the past year? Yes No No  Number falls in past yr: 2 or more - -  Injury with Fall? No - -  Risk for fall due to : History of fall(s) - -  Follow up Falls evaluation completed - -     Depression Screen PHQ 2/9 Scores 10/01/2015 05/30/2015 03/18/2015  PHQ - 2 Score 0 0 0  PHQ- 9 Score - - 1    Cognitive Testing - 6-CIT  Correct? Score   What year is it? yes 0 0 or 4  What month is it? yes 0 0 or 3  Memorize:    Pia Mau,  44,  [place],      What time is it? (within 1 hour) yes 0 0 or 3  Count backwards from 20 yes 0 0, 2, or 4  Name the months of the year yes 0 0, 2, or 4  Repeat name & address above no 3 0, 2, 4, 6, 8, or 10       TOTAL SCORE  3/28   Interpretation:  Normal  Normal (0-7) Abnormal (8-28)    Audit-C Alcohol Use Screening  Question Answer Points  How often do you have alcoholic drink? 2-3 times weekly 3  On days you do drink alcohol, how many drinks do you typically consume? 1 or 2 0  How oftey will you drink 6 or more in a total? never 0  Total Score:  3   A score of 3 or more in women, and 4 or more in men indicates increased risk for alcohol abuse, EXCEPT if all of the points are from question 1.    Assessment & Plan:     Yearly physical  Reviewed patient's Family Medical  History Reviewed and updated list of patient's medical providers Assessment of cognitive impairment was done Assessed patient's functional ability Established a written schedule for health screening [place] Completed and Reviewed  Exercise Activities and Dietary recommendations Goals    None      Immunization History  Administered Date(s) Administered  . Influenza, High Dose Seasonal PF 03/28/2015  . Influenza-Unspecified 06/04/2004, 03/24/2011, 03/28/2014  . Pneumococcal Conjugate-13 08/27/2013  . Pneumococcal-Unspecified 06/13/2002  . Tdap 08/23/2011  . Zoster 07/26/2004    Health Maintenance  Topic Date Due  . FOOT EXAM  04/16/1945  . OPHTHALMOLOGY EXAM  04/16/1945  . URINE MICROALBUMIN  [birth year]16/1945  . HEMOGLOBIN A1C  09/25/2015  . INFLUENZA VACCINE  02/24/2016  . TETANUS/TDAP  08/22/2021  . ZOSTAVAX  Completed  . PNA vac Low Risk Adult  Completed      Discussed health benefits of physical activity, and encouraged him to engage in regular exercise appropriate for his age and condition.    ------------------------------------------------------------------------------------------------------------  1. Annual physical exam Slowly declining due to CHF. Counseled on fall precautions. He is using a cane but advised he needs to start using a walker.   2. Type 2 diabetes mellitus without complication, without long-term current use of insulin (HCC)  - Hemoglobin A1c  3. Chronic kidney disease (CKD), stage 3 (moderate)  - Renal function panel - CBC  4. Hypothyroidism, unspecified hypothyroidism type  - TSH  5. High risk medication use  - Hepatic function panel

## 2015-10-02 ENCOUNTER — Telehealth: Payer: Self-pay

## 2015-10-02 LAB — RENAL FUNCTION PANEL
ALBUMIN: 3.4 g/dL — AB (ref 3.5–4.8)
BUN/Creatinine Ratio: 23 — ABNORMAL HIGH (ref 10–22)
BUN: 51 mg/dL — AB (ref 8–27)
CALCIUM: 8.5 mg/dL — AB (ref 8.6–10.2)
CHLORIDE: 90 mmol/L — AB (ref 96–106)
CO2: 25 mmol/L (ref 18–29)
CREATININE: 2.19 mg/dL — AB (ref 0.76–1.27)
GFR calc Af Amer: 32 mL/min/{1.73_m2} — ABNORMAL LOW (ref >59)
GFR calc non Af Amer: 28 mL/min/{1.73_m2} — ABNORMAL LOW (ref >59)
Glucose: 95 mg/dL (ref 65–99)
PHOSPHORUS: 3.1 mg/dL (ref 2.5–4.5)
Potassium: 3.7 mmol/L (ref 3.5–5.2)
Sodium: 132 mmol/L — ABNORMAL LOW (ref 134–144)

## 2015-10-02 LAB — HEPATIC FUNCTION PANEL
ALK PHOS: 179 IU/L — AB (ref 39–117)
ALT: 19 IU/L (ref 0–44)
AST: 40 IU/L (ref 0–40)
BILIRUBIN TOTAL: 1.4 mg/dL — AB (ref 0.0–1.2)
BILIRUBIN, DIRECT: 0.77 mg/dL — AB (ref 0.00–0.40)
TOTAL PROTEIN: 6.4 g/dL (ref 6.0–8.5)

## 2015-10-02 LAB — CBC
Hematocrit: 33.7 % — ABNORMAL LOW (ref 37.5–51.0)
Hemoglobin: 10.9 g/dL — ABNORMAL LOW (ref 12.6–17.7)
MCH: 30.5 pg (ref 26.6–33.0)
MCHC: 32.3 g/dL (ref 31.5–35.7)
MCV: 94 fL (ref 79–97)
Platelets: 137 x10E3/uL — ABNORMAL LOW (ref 150–379)
RBC: 3.57 x10E6/uL — ABNORMAL LOW (ref 4.14–5.80)
RDW: 22.8 % — ABNORMAL HIGH (ref 12.3–15.4)
WBC: 8 x10E3/uL (ref 3.4–10.8)

## 2015-10-02 LAB — TSH: TSH: 5.48 u[IU]/mL — ABNORMAL HIGH (ref 0.450–4.500)

## 2015-10-02 LAB — HEMOGLOBIN A1C
Est. average glucose Bld gHb Est-mCnc: 151 mg/dL
Hgb A1c MFr Bld: 6.9 % — ABNORMAL HIGH (ref 4.8–5.6)

## 2015-10-02 NOTE — Telephone Encounter (Signed)
Jeffrey Gallegos to add labs, awaiting confirmation.

## 2015-10-02 NOTE — Telephone Encounter (Signed)
-----   Message from Birdie Sons, MD sent at 10/02/2015  1:45 PM EST ----- Please add ferritin, B12 and folate to labs drawn on 10/01/2015 for anemia.

## 2015-10-03 NOTE — Telephone Encounter (Signed)
Confirmation faxed back to [employer]

## 2015-10-05 LAB — VITAMIN B12: Vitamin B-12: 729 pg/mL (ref 211–946)

## 2015-10-05 LAB — FOLATE: FOLATE: 13.5 ng/mL (ref >3.0)

## 2015-10-05 LAB — SPECIMEN STATUS REPORT

## 2015-10-05 LAB — FERRITIN: Ferritin: 72 ng/mL (ref 30–400)

## 2015-10-06 ENCOUNTER — Telehealth: Payer: Self-pay | Admitting: Family Medicine

## 2015-10-06 NOTE — Telephone Encounter (Signed)
Pt  called regarding labs from last week .  He said he was returing Michell's call.    Call back is   Thanks Con Memos

## 2015-10-06 NOTE — Telephone Encounter (Signed)
Spoke with patient and advised him of lab results

## 2015-10-14 ENCOUNTER — Encounter: Payer: Self-pay | Admitting: *Deleted

## 2015-10-14 NOTE — Pre-Procedure Instructions (Signed)
OR NOTIFIED HAS PACEMAKER

## 2015-10-23 ENCOUNTER — Ambulatory Visit: Payer: Medicare Other | Admitting: Anesthesiology

## 2015-10-23 ENCOUNTER — Encounter
Admission: RE | Disposition: A | Discharge status: Home / Self Care | Payer: Self-pay | Source: Ambulatory Visit | Attending: Ophthalmology

## 2015-10-23 ENCOUNTER — Encounter: Payer: Self-pay | Admitting: *Deleted

## 2015-10-23 ENCOUNTER — Ambulatory Visit
Admission: RE | Admit: 2015-10-23 | Discharge: 2015-10-23 | Disposition: A | Discharge status: Home / Self Care | Payer: Medicare Other | Source: Ambulatory Visit | Attending: Ophthalmology | Admitting: Ophthalmology

## 2015-10-23 DIAGNOSIS — Z87891 Personal history of nicotine dependence: Secondary | ICD-10-CM | POA: Insufficient documentation

## 2015-10-23 DIAGNOSIS — I252 Old myocardial infarction: Secondary | ICD-10-CM | POA: Insufficient documentation

## 2015-10-23 DIAGNOSIS — Z9842 Cataract extraction status, left eye: Secondary | ICD-10-CM | POA: Diagnosis not present

## 2015-10-23 DIAGNOSIS — M1990 Unspecified osteoarthritis, unspecified site: Secondary | ICD-10-CM | POA: Insufficient documentation

## 2015-10-23 DIAGNOSIS — G4730 Sleep apnea, unspecified: Secondary | ICD-10-CM | POA: Diagnosis not present

## 2015-10-23 DIAGNOSIS — I25119 Atherosclerotic heart disease of native coronary artery with unspecified angina pectoris: Secondary | ICD-10-CM | POA: Insufficient documentation

## 2015-10-23 DIAGNOSIS — R0602 Shortness of breath: Secondary | ICD-10-CM | POA: Insufficient documentation

## 2015-10-23 DIAGNOSIS — N183 Chronic kidney disease, stage 3 (moderate): Secondary | ICD-10-CM | POA: Diagnosis not present

## 2015-10-23 DIAGNOSIS — I4891 Unspecified atrial fibrillation: Secondary | ICD-10-CM | POA: Insufficient documentation

## 2015-10-23 DIAGNOSIS — H2511 Age-related nuclear cataract, right eye: Secondary | ICD-10-CM | POA: Insufficient documentation

## 2015-10-23 DIAGNOSIS — M7989 Other specified soft tissue disorders: Secondary | ICD-10-CM | POA: Insufficient documentation

## 2015-10-23 DIAGNOSIS — Z950 Presence of cardiac pacemaker: Secondary | ICD-10-CM | POA: Insufficient documentation

## 2015-10-23 DIAGNOSIS — Z955 Presence of coronary angioplasty implant and graft: Secondary | ICD-10-CM | POA: Diagnosis not present

## 2015-10-23 DIAGNOSIS — R0601 Orthopnea: Secondary | ICD-10-CM | POA: Insufficient documentation

## 2015-10-23 DIAGNOSIS — D649 Anemia, unspecified: Secondary | ICD-10-CM | POA: Insufficient documentation

## 2015-10-23 DIAGNOSIS — I509 Heart failure, unspecified: Secondary | ICD-10-CM | POA: Diagnosis not present

## 2015-10-23 DIAGNOSIS — E7800 Pure hypercholesterolemia, unspecified: Secondary | ICD-10-CM | POA: Insufficient documentation

## 2015-10-23 DIAGNOSIS — I130 Hypertensive heart and chronic kidney disease with heart failure and stage 1 through stage 4 chronic kidney disease, or unspecified chronic kidney disease: Secondary | ICD-10-CM | POA: Diagnosis not present

## 2015-10-23 DIAGNOSIS — K219 Gastro-esophageal reflux disease without esophagitis: Secondary | ICD-10-CM | POA: Insufficient documentation

## 2015-10-23 DIAGNOSIS — E1122 Type 2 diabetes mellitus with diabetic chronic kidney disease: Secondary | ICD-10-CM | POA: Diagnosis not present

## 2015-10-23 DIAGNOSIS — Z951 Presence of aortocoronary bypass graft: Secondary | ICD-10-CM | POA: Insufficient documentation

## 2015-10-23 HISTORY — DX: Other complications of anesthesia, initial encounter: T88.59XA

## 2015-10-23 HISTORY — PX: CATARACT EXTRACTION W/PHACO: SHX586

## 2015-10-23 HISTORY — DX: Unspecified osteoarthritis, unspecified site: M19.90

## 2015-10-23 HISTORY — DX: Adverse effect of unspecified anesthetic, initial encounter: T41.45XA

## 2015-10-23 SURGERY — PHACOEMULSIFICATION, CATARACT, WITH IOL INSERTION
Anesthesia: Monitor Anesthesia Care | Site: Eye | Laterality: Right | Wound class: Clean

## 2015-10-23 MED ORDER — CEFUROXIME OPHTHALMIC INJECTION 1 MG/0.1 ML
INJECTION | OPHTHALMIC | Status: DC | PRN
  Administered 2015-10-23: .1 mL via INTRACAMERAL

## 2015-10-23 MED ORDER — NA CHONDROIT SULF-NA HYALURON 40-17 MG/ML IO SOLN
INTRAOCULAR | Status: DC | PRN
  Administered 2015-10-23: 1 mL via INTRAOCULAR

## 2015-10-23 MED ORDER — MOXIFLOXACIN HCL 0.5 % OP SOLN: 1.0000 [drp] | OPHTHALMIC | Status: DC | PRN

## 2015-10-23 MED ORDER — CARBACHOL 0.01 % IO SOLN
INTRAOCULAR | Status: DC | PRN
  Administered 2015-10-23: .5 mL via INTRAOCULAR

## 2015-10-23 MED ORDER — POVIDONE-IODINE 5 % OP SOLN
1.0000 "application " | Freq: Once | OPHTHALMIC | Status: AC
  Administered 2015-10-23: 1 via OPHTHALMIC

## 2015-10-23 MED ORDER — ARMC OPHTHALMIC DILATING GEL
1.0000 "application " | OPHTHALMIC | Status: DC | PRN
  Administered 2015-10-23: 1 via OPHTHALMIC

## 2015-10-23 MED ORDER — SODIUM CHLORIDE 0.9 % IV SOLN
INTRAVENOUS | Status: DC
  Administered 2015-10-23: 11:00:00 via INTRAVENOUS

## 2015-10-23 MED ORDER — NA CHONDROIT SULF-NA HYALURON 40-17 MG/ML IO SOLN
INTRAOCULAR | Status: AC
  Filled 2015-10-23: qty 1

## 2015-10-23 MED ORDER — TETRACAINE HCL 0.5 % OP SOLN
1.0000 [drp] | Freq: Once | OPHTHALMIC | Status: AC
  Administered 2015-10-23: 1 [drp] via OPHTHALMIC

## 2015-10-23 MED ORDER — EPINEPHRINE HCL 1 MG/ML IJ SOLN
INTRAMUSCULAR | Status: DC | PRN
  Administered 2015-10-23: 1 mL via OPHTHALMIC

## 2015-10-23 MED ORDER — LIDOCAINE HCL (PF) 4 % IJ SOLN
INTRAMUSCULAR | Status: AC
Start: 2015-10-23 — End: 2015-10-23
  Filled 2015-10-23: qty 5

## 2015-10-23 MED ORDER — EPINEPHRINE HCL 1 MG/ML IJ SOLN
INTRAMUSCULAR | Status: AC
  Filled 2015-10-23: qty 2

## 2015-10-23 MED ORDER — CEFUROXIME OPHTHALMIC INJECTION 1 MG/0.1 ML
INJECTION | OPHTHALMIC | Status: AC
  Filled 2015-10-23: qty 0.1

## 2015-10-23 MED ORDER — MOXIFLOXACIN HCL 0.5 % OP SOLN
OPHTHALMIC | Status: DC | PRN
  Administered 2015-10-23: 1 [drp] via OPHTHALMIC

## 2015-10-23 MED ORDER — LIDOCAINE HCL (PF) 4 % IJ SOLN
INTRAMUSCULAR | Status: DC | PRN
  Administered 2015-10-23: .5 mL via OPHTHALMIC

## 2015-10-23 SURGICAL SUPPLY — 22 items
CANNULA ANT/CHMB 27GA (MISCELLANEOUS) ×3 IMPLANT
CUP MEDICINE 2OZ PLAST GRAD ST (MISCELLANEOUS) ×3 IMPLANT
GLOVE BIO SURGEON STRL SZ8 (GLOVE) ×3 IMPLANT
GLOVE BIOGEL M 6.5 STRL (GLOVE) ×3 IMPLANT
GLOVE SURG LX 8.0 MICRO (GLOVE) ×2
GLOVE SURG LX STRL 8.0 MICRO (GLOVE) ×1 IMPLANT
GOWN STRL REUS W/ TWL LRG LVL3 (GOWN DISPOSABLE) ×2 IMPLANT
GOWN STRL REUS W/TWL LRG LVL3 (GOWN DISPOSABLE) ×4
LENS IOL TECNIS 19.0 (Intraocular Lens) ×3 IMPLANT
LENS IOL TECNIS MONO 1P 19.0 (Intraocular Lens) ×1 IMPLANT
PACK CATARACT (MISCELLANEOUS) ×3 IMPLANT
PACK CATARACT BRASINGTON LX (MISCELLANEOUS) ×3 IMPLANT
PACK EYE AFTER SURG (MISCELLANEOUS) ×3 IMPLANT
SOL BSS BAG (MISCELLANEOUS) ×3
SOL PREP PVP 2OZ (MISCELLANEOUS) ×3
SOLUTION BSS BAG (MISCELLANEOUS) ×1 IMPLANT
SOLUTION PREP PVP 2OZ (MISCELLANEOUS) ×1 IMPLANT
SYR 3ML LL SCALE MARK (SYRINGE) ×3 IMPLANT
SYR 5ML LL (SYRINGE) ×3 IMPLANT
SYR TB 1ML 27GX1/2 LL (SYRINGE) ×3 IMPLANT
WATER STERILE IRR 1000ML POUR (IV SOLUTION) ×3 IMPLANT
WIPE NON LINTING 3.25X3.25 (MISCELLANEOUS) ×3 IMPLANT

## 2015-10-23 NOTE — Discharge Instructions (Signed)
AMBULATORY SURGERY  DISCHARGE INSTRUCTIONS   1) The drugs that you were given will stay in your system until tomorrow so for the next 24 hours you should not:  A) Drive an automobile B) Make any legal decisions C) Drink any alcoholic beverage   2) You may resume regular meals tomorrow.  Today it is better to start with liquids and gradually work up to solid foods.  You may eat anything you prefer, but it is better to start with liquids, then soup and crackers, and gradually work up to solid foods.   3) Please notify your doctor immediately if you have any unusual bleeding, trouble breathing, redness and pain at the surgery site, drainage, fever, or pain not relieved by medication.    4) Additional Instructions:     Eye Surgery Discharge Instructions  Expect mild scratchy sensation or mild soreness. DO NOT RUB YOUR EYE!  The day of surgery:  Minimal physical activity, but bed rest is not required  No reading, computer work, or close hand work  No bending, lifting, or straining.  May watch TV  For 24 hours:  No driving, legal decisions, or alcoholic beverages  Safety precautions  Eat anything you prefer: It is better to start with liquids, then soup then solid foods.  _____ Eye patch should be worn until postoperative exam tomorrow.  ____ Solar shield eyeglasses should be worn for comfort in the sunlight/patch while sleeping  Resume all regular medications including aspirin or Coumadin if these were discontinued prior to surgery. You may shower, bathe, shave, or wash your hair. Tylenol may be taken for mild discomfort.  Call your doctor if you experience significant pain, nausea, or vomiting, fever > 101 or other signs of infection. [phone] or [phone] Specific instructions:  Follow-up Information    Follow up with PORFILIO,WILLIAM LOUIS, MD In 1 day.   Specialty:  Ophthalmology   Why:  March 31 at 11:00am   Contact information:   [address] [place] [address] [phone]        Please contact your physician with any problems or Same Day Surgery at [phone], Monday through Friday 6 am to 4 pm, or [place] at [facility] number at [phone].

## 2015-10-23 NOTE — Anesthesia Procedure Notes (Signed)
Date/Time: 10/23/2015 12:20 PM Performed by: Kennon Holter Pre-anesthesia Checklist: Timeout performed, Patient being monitored, Suction available, Emergency Drugs available and Patient identified Patient Re-evaluated:Patient Re-evaluated prior to inductionOxygen Delivery Method: Nasal cannula

## 2015-10-23 NOTE — H&P (Signed)
All labs reviewed. Abnormal studies sent to patients PCP when indicated.  Previous H&P reviewed, patient examined, there are NO CHANGES.  Jeffrey Gallegos LOUIS3/30/201712:10 PM

## 2015-10-23 NOTE — Op Note (Signed)
PREOPERATIVE DIAGNOSIS:  Nuclear sclerotic cataract of the right eye.   POSTOPERATIVE DIAGNOSIS: nuclear sclerotic cataract right eye   OPERATIVE PROCEDURE:  Procedure(s): CATARACT EXTRACTION PHACO AND INTRAOCULAR LENS PLACEMENT (IOC)   SURGEON:  Birder Robson, MD.   ANESTHESIA:  Anesthesiologist: Alvin Critchley, MD CRNA: Kennon Holter, CRNA  1.      Managed anesthesia care. 2.      Topical tetracaine drops followed by 2% Xylocaine jelly applied in the preoperative holding area.       3.    0.2 ml of epi-Shugarcaine was  placed in the anterior chamber following the paracentesis.    COMPLICATIONS:  None.   TECHNIQUE:   Stop and chop   DESCRIPTION OF PROCEDURE:  The patient was examined and consented in the preoperative holding area where the aforementioned topical anesthesia was applied to the right eye and then brought back to the Operating Room where the right eye was prepped and draped in the usual sterile ophthalmic fashion and a lid speculum was placed. A paracentesis was created with the side port blade and the anterior chamber was filled with viscoelastic. A near clear corneal incision was performed with the steel keratome. A continuous curvilinear capsulorrhexis was performed with a cystotome followed by the capsulorrhexis forceps. Hydrodissection and hydrodelineation were carried out with BSS on a blunt cannula. The lens was removed in a stop and chop  technique and the remaining cortical material was removed with the irrigation-aspiration handpiece. The capsular bag was inflated with viscoelastic and the Technis [id number]  lens was placed in the capsular bag without complication. The remaining viscoelastic was removed from the eye with the irrigation-aspiration handpiece. The wounds were hydrated. The anterior chamber was flushed with Miostat and the eye was inflated to physiologic pressure. 0.1 mL of cefuroxime concentration 10 mg/mL was placed in the anterior chamber. The wounds were  found to be water tight. The eye was dressed with Vigamox. The patient was given protective glasses to wear throughout the day and a shield with which to sleep tonight. The patient was also given drops with which to begin a drop regimen today and will follow-up with me in one day.  Implant Name Type Inv. Item Serial No. Manufacturer Lot No. LRB No. Used  LENS IOL TECNIS 19.0 - [id number] Intraocular Lens LENS IOL TECNIS 19.0 [id number] AMO [id number] Right 1   Procedure(s) with comments: CATARACT EXTRACTION PHACO AND INTRAOCULAR LENS PLACEMENT (IOC) (Right) - [place] 00:55 AP% 19.7 CDE 10.86 fluid pack lot # [id number]  Electronically signed: Tim Lair 10/23/2015 12:37 PM

## 2015-10-23 NOTE — Anesthesia Postprocedure Evaluation (Signed)
Anesthesia Post Note  Patient: Jeffrey Gallegos  Procedure(s) Performed: Procedure(s) (LRB): CATARACT EXTRACTION PHACO AND INTRAOCULAR LENS PLACEMENT (IOC) (Right)  Patient location during evaluation: Short Stay Anesthesia Type: MAC Level of consciousness: awake, awake and alert and oriented Pain management: satisfactory to patient Vital Signs Assessment: post-procedure vital signs reviewed and stable Respiratory status: spontaneous breathing Cardiovascular status: blood pressure returned to baseline Postop Assessment: no headache Anesthetic complications: no Comments: VSS, NAD    Last Vitals:  Filed Vitals:   10/23/15 1023 10/23/15 1244  BP: 103/57 104/58  Pulse: 59 62  Temp: 36.6 C 35.7 C  Resp: 16 16    Last Pain: There were no vitals filed for this visit.               Ireanna Finlayson, RON

## 2015-10-23 NOTE — Anesthesia Preprocedure Evaluation (Signed)
Anesthesia Evaluation  Patient identified by MRN, date of birth, ID band Patient awake  General Assessment Comment:Trouble waking up after cardioversion  Reviewed: Allergy & Precautions, NPO status , Patient's Chart, lab work & pertinent test results, reviewed documented beta blocker date and time   History of Anesthesia Complications (+) history of anesthetic complications  Airway Mallampati: II  TM Distance: >3 FB     Dental  (+) Upper Dentures   Pulmonary shortness of breath and with exertion, sleep apnea , former smoker,    Pulmonary exam normal breath sounds clear to auscultation       Cardiovascular hypertension, Pt. on medications and Pt. on home beta blockers + angina with exertion + CAD, + Past MI, +CHF and + Orthopnea  + dysrhythmias Atrial Fibrillation + pacemaker   Paced   Neuro/Psych negative neurological ROS  negative psych ROS   GI/Hepatic PUD, GERD  Medicated and Controlled,  Endo/Other  diabetes, Well Controlled, Type 2, Oral Hypoglycemic AgentsHypothyroidism   Renal/GU Renal InsufficiencyRenal disease  negative genitourinary   Musculoskeletal  (+) Arthritis , Osteoarthritis,    Abdominal Normal abdominal exam  (+)   Peds negative pediatric ROS (+)  Hematology  (+) anemia ,   Anesthesia Other Findings   Reproductive/Obstetrics                             Anesthesia Physical Anesthesia Plan  ASA: IV  Anesthesia Plan: MAC   Post-op Pain Management:    Induction: Intravenous  Airway Management Planned: Nasal Cannula  Additional Equipment:   Intra-op Plan:   Post-operative Plan:   Informed Consent: I have reviewed the patients History and Physical, chart, labs and discussed the procedure including the risks, benefits and alternatives for the proposed anesthesia with the patient or authorized representative who has indicated his/her understanding and acceptance.    Dental advisory given  Plan Discussed with: CRNA and Surgeon  Anesthesia Plan Comments:         Anesthesia Quick Evaluation

## 2015-10-23 NOTE — Transfer of Care (Signed)
Immediate Anesthesia Transfer of Care Note  Patient: ZYAN GIGANTE  Procedure(s) Performed: Procedure(s) with comments: CATARACT EXTRACTION PHACO AND INTRAOCULAR LENS PLACEMENT (IOC) (Right) - [place] 00:55 AP% 19.7 CDE 10.86 fluid pack lot # [id number]  Patient Location: PACU and Short Stay  Anesthesia Type:MAC  Level of Consciousness: awake, alert  and oriented  Airway & Oxygen Therapy: Patient Spontanous Breathing  Post-op Assessment: Report given to RN and Post -op Vital signs reviewed and stable  Post vital signs: Reviewed and stable  Last Vitals:  Filed Vitals:   10/23/15 1023 10/23/15 1244  BP: 103/57   Pulse: 59   Temp: 36.6 C 35.7 C  Resp: 16     Complications: No apparent anesthesia complications

## 2015-11-04 ENCOUNTER — Telehealth: Payer: Self-pay | Admitting: Family Medicine

## 2015-11-04 NOTE — Telephone Encounter (Signed)
Pt wife Rod Holler called to let us know pt past away last Friday /MW

## 2015-11-24 DEATH — deceased

## 2016-03-10 NOTE — Telephone Encounter (Signed)
error 

## 2016-09-07 IMAGING — CT CT ABD-PELV W/O CM
1 of 2 series · 14 of 32 positions shown, 18 images · non-contrast
Comparison: 05/15/2015

CLINICAL DATA: Complains of constipation.

EXAM:
CT ABDOMEN AND PELVIS WITHOUT CONTRAST
TECHNIQUE: Multidetector CT imaging of the abdomen and pelvis was performed
following the standard protocol without IV contrast.

[Series 2: routine abd pel without · axial · non-contrast · 0.77mm/px · z∈[-1056,-671]mm · 14 of 88 slices shown, 18 images]
[im 7/88  soft-tissue]
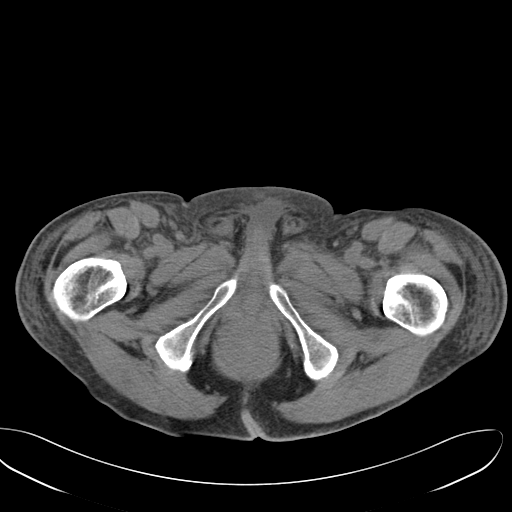
[im 7/88  bone]
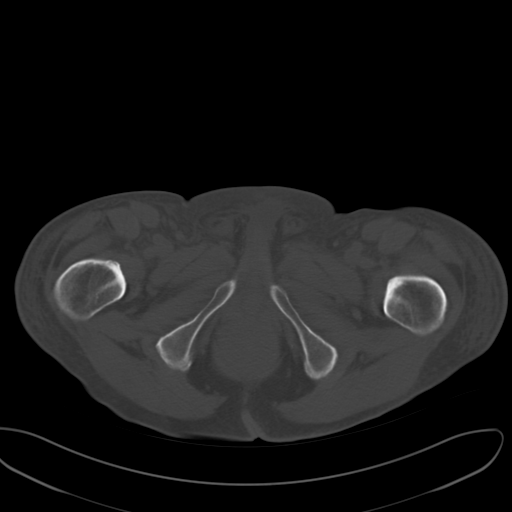
[im 14/88  soft-tissue]
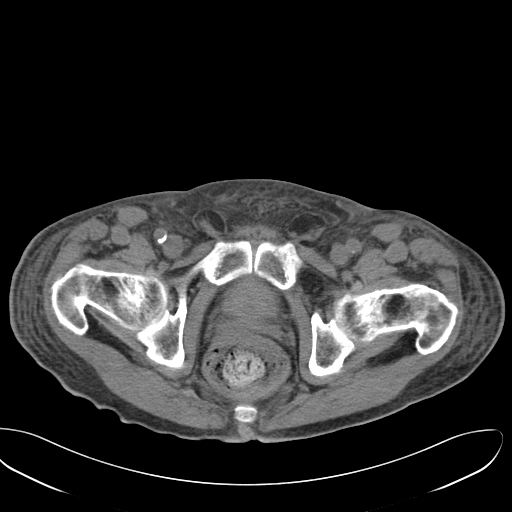
[im 21/88  soft-tissue]
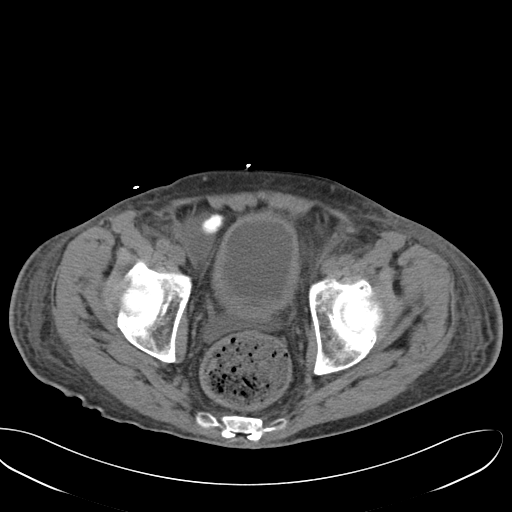
[im 28/88  soft-tissue]
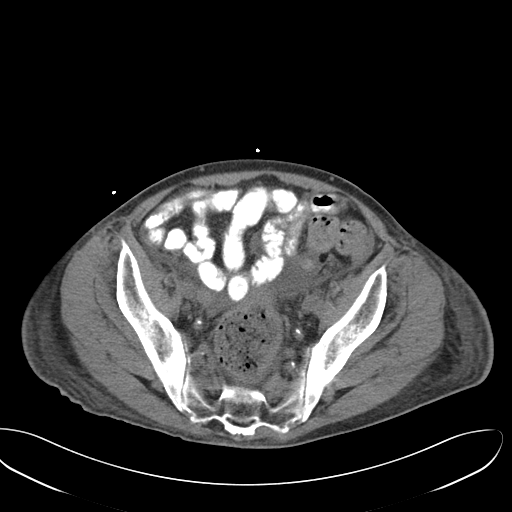
[im 35/88  soft-tissue]
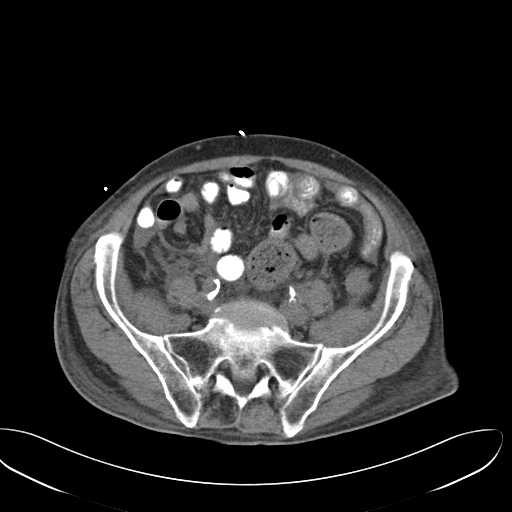
[im 42/88  soft-tissue]
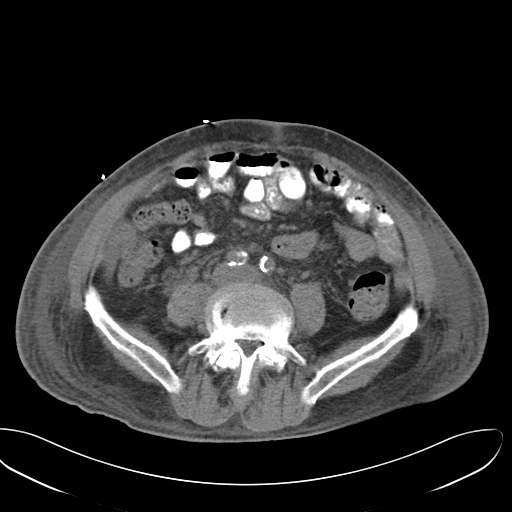
[im 49/88  soft-tissue]
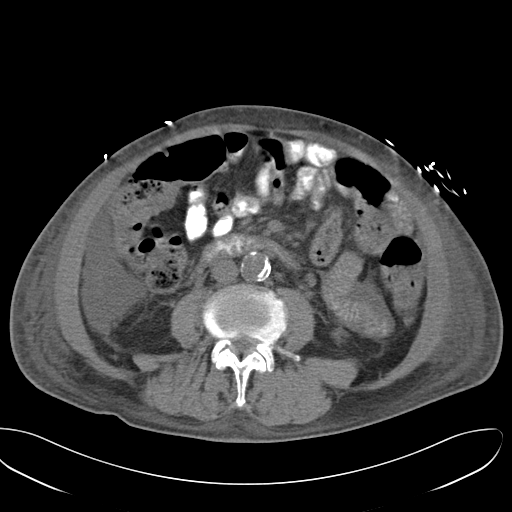
[im 56/88  soft-tissue]
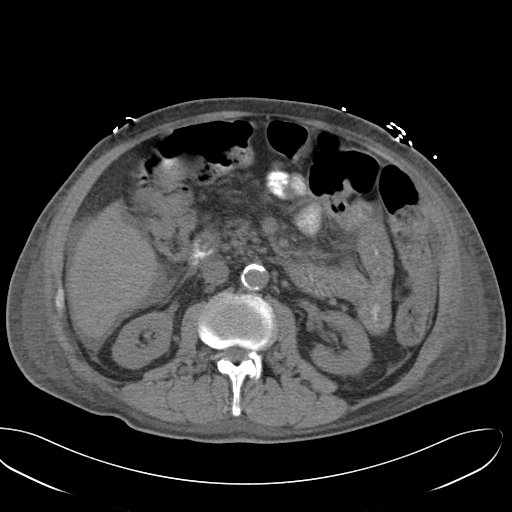
[im 63/88  soft-tissue]
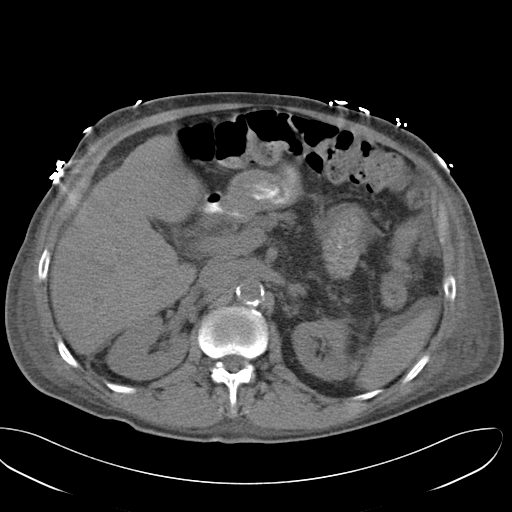
[im 63/88  bone]
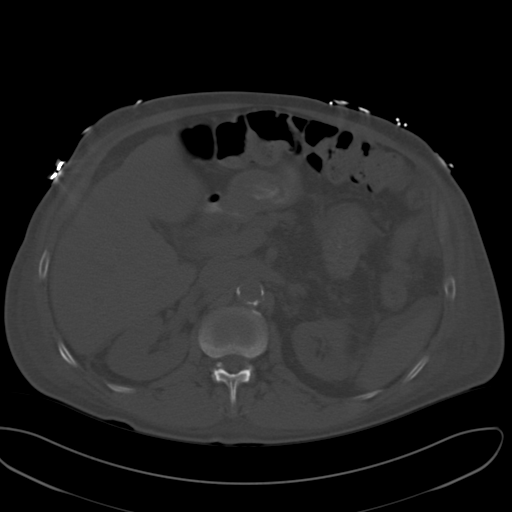
[im 70/88  soft-tissue]
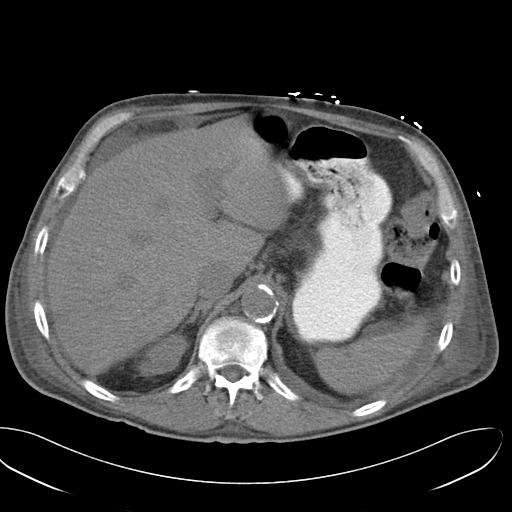
[im 74/88  lung]
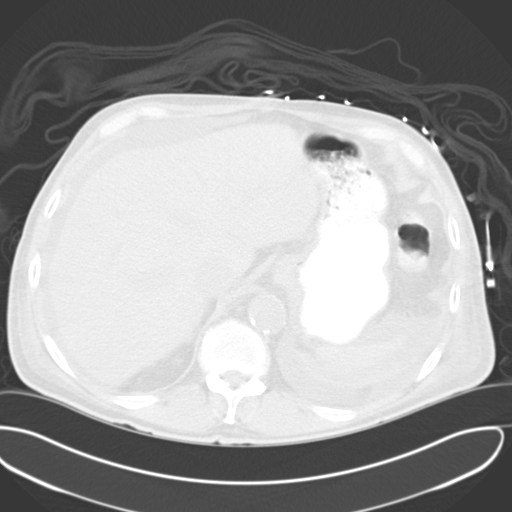
[im 77/88  soft-tissue]
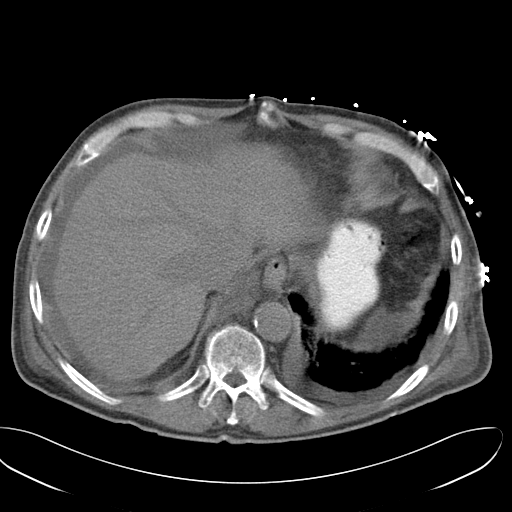
[im 77/88  lung]
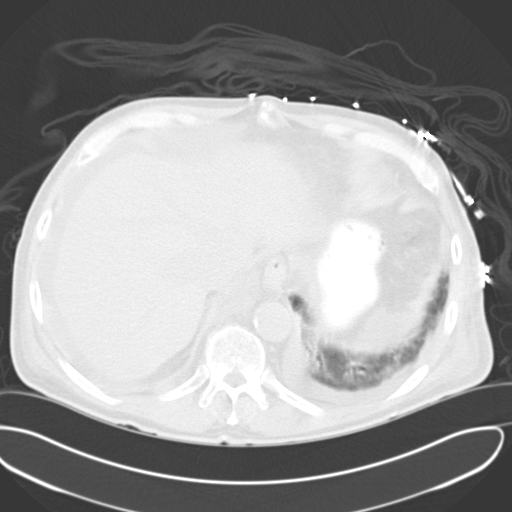
[im 81/88  lung]
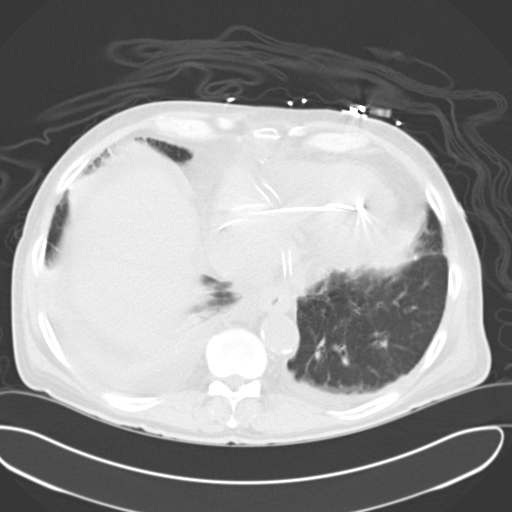
[im 84/88  soft-tissue]
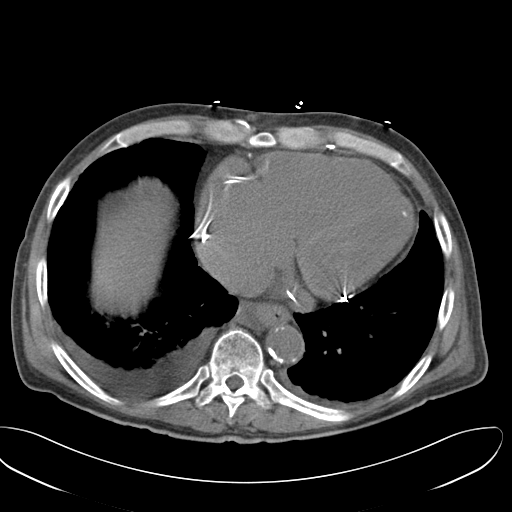
[im 84/88  lung]
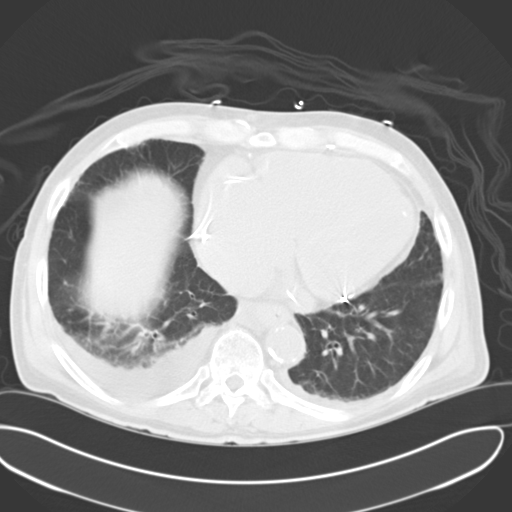

[14 of 32 positions shown; findings below may reference images not displayed]

FINDINGS: Lower chest: Again noted are biventricular pacer leads. Again noted
is a small right pleural fluid. There is a new small left pleural
effusion. Small amount of atelectasis at the right lung base. Again
noted is enlargement of the heart.

Hepatobiliary: There is increased perihepatic ascites. No gross
abnormality to the liver but difficult to exclude mild cirrhosis.
There are high-density stones in the gallbladder with small amount
of fluid around the gallbladder. The gallbladder is not distended.

Pancreas: Normal appearance of the pancreas without duct dilatation
or inflammation.

Spleen: Again noted is perisplenic ascites.  No enlargement.

Adrenals/Urinary Tract: Stable fullness of the left adrenal gland.
Normal appearance the right adrenal gland. Normal appearance of both
kidneys without stones or hydronephrosis. Again noted is mild
bladder wall thickening but the bladder is also not distended.

Stomach/Bowel: Large amount of stool in the rectum. Moderate amount
of stool throughout most of the colon. Overall, the stool burden in
the colon has slightly decreased since 05/15/2015. Oral contrast
throughout the small bowel without evidence for dilatation or
obstruction.

Vascular/Lymphatic: Extensive calcifications involving the abdominal
aorta without aneurysm. Iliac arteries are also heavily calcified.
No significant lymphadenopathy.

Reproductive: No gross abnormality to the prostate or seminal
vesicles.

Other: The amount of pelvic ascites is similar to the previous
examination but there is increased ascites in the upper abdomen,
particularly around the liver. In addition, there is increased
subcutaneous edema throughout the abdomen and pelvis compared to
05/15/2015.

Musculoskeletal: Extensive facet arthropathy at the lumbosacral
junction. No acute bone abnormality.
IMPRESSION: There continues to be a large amount of stool in the colon but the
stool burden has slightly decreased since 05/15/2015.

Increased abdominal ascites and subcutaneous edema. There is also a
new left pleural effusion. Findings are compatible with a fluid
overload state. Difficult to exclude mild cirrhosis.

Cholelithiasis without gallbladder distension.

Again noted is mild bladder wall thickening which is nonspecific.
